# Patient Record
Sex: Male | Born: 1945
Health system: Southern US, Community
[De-identification: ages and names within clinical notes are randomized; demographics above are authoritative.]

## PROBLEM LIST (undated history)

## (undated) DIAGNOSIS — G4730 Sleep apnea, unspecified: Secondary | ICD-10-CM

## (undated) DIAGNOSIS — R7303 Prediabetes: Secondary | ICD-10-CM

## (undated) DIAGNOSIS — F329 Major depressive disorder, single episode, unspecified: Secondary | ICD-10-CM

## (undated) DIAGNOSIS — I219 Acute myocardial infarction, unspecified: Secondary | ICD-10-CM

## (undated) DIAGNOSIS — S2239XA Fracture of one rib, unspecified side, initial encounter for closed fracture: Secondary | ICD-10-CM

## (undated) DIAGNOSIS — I10 Essential (primary) hypertension: Secondary | ICD-10-CM

## (undated) DIAGNOSIS — H9190 Unspecified hearing loss, unspecified ear: Secondary | ICD-10-CM

## (undated) DIAGNOSIS — E119 Type 2 diabetes mellitus without complications: Secondary | ICD-10-CM

## (undated) DIAGNOSIS — Z85820 Personal history of malignant melanoma of skin: Secondary | ICD-10-CM

## (undated) DIAGNOSIS — I2510 Atherosclerotic heart disease of native coronary artery without angina pectoris: Secondary | ICD-10-CM

## (undated) DIAGNOSIS — S2249XA Multiple fractures of ribs, unspecified side, initial encounter for closed fracture: Secondary | ICD-10-CM

## (undated) DIAGNOSIS — E785 Hyperlipidemia, unspecified: Secondary | ICD-10-CM

## (undated) DIAGNOSIS — J302 Other seasonal allergic rhinitis: Secondary | ICD-10-CM

## (undated) DIAGNOSIS — F32A Depression, unspecified: Secondary | ICD-10-CM

## (undated) HISTORY — DX: Hyperlipidemia, unspecified: E78.5

## (undated) HISTORY — DX: Other seasonal allergic rhinitis: J30.2

## (undated) HISTORY — DX: Atherosclerotic heart disease of native coronary artery without angina pectoris: I25.10

## (undated) HISTORY — DX: Acute myocardial infarction, unspecified: I21.9

## (undated) HISTORY — DX: Multiple fractures of ribs, unspecified side, initial encounter for closed fracture: S22.49XA

## (undated) HISTORY — PX: WISDOM TOOTH EXTRACTION: SHX21

## (undated) HISTORY — DX: Fracture of one rib, unspecified side, initial encounter for closed fracture: S22.39XA

## (undated) HISTORY — DX: Essential (primary) hypertension: I10

## (undated) HISTORY — DX: Depression, unspecified: F32.A

## (undated) HISTORY — DX: Major depressive disorder, single episode, unspecified: F32.9

---

## 1988-05-13 DIAGNOSIS — Z85820 Personal history of malignant melanoma of skin: Secondary | ICD-10-CM

## 1988-05-13 HISTORY — DX: Personal history of malignant melanoma of skin: Z85.820

## 1988-05-13 HISTORY — PX: MELANOMA EXCISION: SHX5266

## 2004-06-18 ENCOUNTER — Emergency Department (HOSPITAL_COMMUNITY): Admission: EM | Admit: 2004-06-18 | Discharge: 2004-06-18 | Payer: Self-pay | Admitting: Emergency Medicine

## 2004-06-26 ENCOUNTER — Ambulatory Visit: Payer: Self-pay | Admitting: Family Medicine

## 2004-08-30 ENCOUNTER — Ambulatory Visit: Payer: Self-pay | Admitting: Family Medicine

## 2005-02-14 ENCOUNTER — Ambulatory Visit: Payer: Self-pay | Admitting: Family Medicine

## 2006-06-25 ENCOUNTER — Ambulatory Visit: Payer: Self-pay | Admitting: Family Medicine

## 2006-08-06 ENCOUNTER — Ambulatory Visit: Payer: Self-pay | Admitting: Family Medicine

## 2006-08-06 LAB — CONVERTED CEMR LAB
AST: 26 units/L (ref 0–37)
Albumin: 3.6 g/dL (ref 3.5–5.2)
Alkaline Phosphatase: 66 units/L (ref 39–117)
BUN: 13 mg/dL (ref 6–23)
Basophils Absolute: 0 10*3/uL (ref 0.0–0.1)
Chloride: 105 meq/L (ref 96–112)
Cholesterol: 232 mg/dL (ref 0–200)
Creatinine, Ser: 1.4 mg/dL (ref 0.4–1.5)
Direct LDL: 175.8 mg/dL
Eosinophils Absolute: 0.2 10*3/uL (ref 0.0–0.6)
GFR calc non Af Amer: 55 mL/min
HDL: 35.9 mg/dL — ABNORMAL LOW (ref >39.0)
MCHC: 34.6 g/dL (ref 30.0–36.0)
MCV: 94.9 fL (ref 78.0–100.0)
Monocytes Relative: 8.4 % (ref 3.0–11.0)
PSA: 0.84 ng/mL
PSA: 0.84 ng/mL (ref 0.10–4.00)
Potassium: 3.6 meq/L (ref 3.5–5.1)
RBC: 4.69 M/uL (ref 4.22–5.81)
RDW: 12.8 % (ref 11.5–14.6)
Sodium: 142 meq/L (ref 135–145)
Total Bilirubin: 0.8 mg/dL (ref 0.3–1.2)
Total CHOL/HDL Ratio: 6.5
Triglycerides: 184 mg/dL — ABNORMAL HIGH (ref 0–149)

## 2006-08-07 ENCOUNTER — Ambulatory Visit: Payer: Self-pay | Admitting: Family Medicine

## 2006-09-19 ENCOUNTER — Encounter: Payer: Self-pay | Admitting: Family Medicine

## 2006-09-19 DIAGNOSIS — I10 Essential (primary) hypertension: Secondary | ICD-10-CM | POA: Insufficient documentation

## 2006-09-19 DIAGNOSIS — R454 Irritability and anger: Secondary | ICD-10-CM | POA: Insufficient documentation

## 2006-09-19 DIAGNOSIS — F329 Major depressive disorder, single episode, unspecified: Secondary | ICD-10-CM | POA: Insufficient documentation

## 2006-09-23 ENCOUNTER — Ambulatory Visit: Payer: Self-pay | Admitting: Family Medicine

## 2006-09-23 LAB — CONVERTED CEMR LAB
ALT: 32 units/L (ref 0–40)
AST: 33 units/L (ref 0–37)
CO2: 28 meq/L (ref 19–32)
Chloride: 106 meq/L (ref 96–112)
Cholesterol: 226 mg/dL (ref 0–200)
Creatinine, Ser: 1.4 mg/dL (ref 0.4–1.5)
Direct LDL: 167.4 mg/dL
Glucose, Bld: 116 mg/dL — ABNORMAL HIGH (ref 70–99)
HDL: 26.5 mg/dL — ABNORMAL LOW (ref >39.0)
Hgb A1c MFr Bld: 6.2 % — ABNORMAL HIGH (ref 4.6–6.0)
PSA: 1.04 ng/mL (ref 0.10–4.00)
Potassium: 3.9 meq/L (ref 3.5–5.1)
Sodium: 140 meq/L (ref 135–145)
VLDL: 28 mg/dL (ref 0–40)

## 2006-09-26 ENCOUNTER — Ambulatory Visit: Payer: Self-pay | Admitting: Family Medicine

## 2006-09-27 LAB — CONVERTED CEMR LAB: OCCULT 1: POSITIVE

## 2006-10-03 ENCOUNTER — Ambulatory Visit: Payer: Self-pay | Admitting: Family Medicine

## 2006-10-07 ENCOUNTER — Encounter: Payer: Self-pay | Admitting: Family Medicine

## 2006-10-28 ENCOUNTER — Encounter (INDEPENDENT_AMBULATORY_CARE_PROVIDER_SITE_OTHER): Payer: Self-pay | Admitting: *Deleted

## 2006-10-29 ENCOUNTER — Ambulatory Visit: Payer: Self-pay | Admitting: Family Medicine

## 2006-10-29 LAB — CONVERTED CEMR LAB: Hgb A1c MFr Bld: 6.1 % — ABNORMAL HIGH (ref 4.6–6.0)

## 2006-10-30 ENCOUNTER — Ambulatory Visit: Payer: Self-pay | Admitting: Family Medicine

## 2006-10-31 ENCOUNTER — Encounter (INDEPENDENT_AMBULATORY_CARE_PROVIDER_SITE_OTHER): Payer: Self-pay | Admitting: *Deleted

## 2007-02-09 ENCOUNTER — Ambulatory Visit: Payer: Self-pay | Admitting: Family Medicine

## 2007-02-09 LAB — CONVERTED CEMR LAB
ALT: 22 units/L (ref 0–53)
AST: 22 units/L (ref 0–37)

## 2007-02-18 ENCOUNTER — Ambulatory Visit: Payer: Self-pay | Admitting: Family Medicine

## 2007-02-18 DIAGNOSIS — R7303 Prediabetes: Secondary | ICD-10-CM | POA: Insufficient documentation

## 2007-03-09 ENCOUNTER — Ambulatory Visit: Payer: Self-pay | Admitting: Family Medicine

## 2007-05-21 ENCOUNTER — Ambulatory Visit: Payer: Self-pay | Admitting: Family Medicine

## 2007-10-27 ENCOUNTER — Ambulatory Visit: Payer: Self-pay | Admitting: Family Medicine

## 2009-02-09 ENCOUNTER — Telehealth: Payer: Self-pay | Admitting: Family Medicine

## 2009-04-13 ENCOUNTER — Telehealth: Payer: Self-pay | Admitting: Family Medicine

## 2009-05-25 ENCOUNTER — Ambulatory Visit: Payer: Self-pay | Admitting: Family Medicine

## 2009-07-03 ENCOUNTER — Ambulatory Visit: Payer: Self-pay | Admitting: Family Medicine

## 2009-07-03 ENCOUNTER — Telehealth: Payer: Self-pay | Admitting: Family Medicine

## 2009-07-13 ENCOUNTER — Ambulatory Visit: Payer: Self-pay | Admitting: Family Medicine

## 2009-07-13 LAB — CONVERTED CEMR LAB
AST: 44 units/L — ABNORMAL HIGH (ref 0–37)
BUN: 13 mg/dL (ref 6–23)
Basophils Absolute: 0 10*3/uL (ref 0.0–0.1)
Bilirubin, Direct: 0 mg/dL (ref 0.0–0.3)
Cholesterol: 227 mg/dL — ABNORMAL HIGH (ref 0–200)
Creatinine, Ser: 1.3 mg/dL (ref 0.4–1.5)
GFR calc non Af Amer: 59.1 mL/min (ref >60)
Glucose, Bld: 107 mg/dL — ABNORMAL HIGH (ref 70–99)
HCT: 42.1 % (ref 39.0–52.0)
HDL: 42.3 mg/dL (ref >39.00)
Hgb A1c MFr Bld: 6.2 % (ref 4.6–6.5)
Lymphs Abs: 2 10*3/uL (ref 0.7–4.0)
Microalb Creat Ratio: 28.1 mg/g (ref 0.0–30.0)
Microalb, Ur: 11.5 mg/dL — ABNORMAL HIGH (ref 0.0–1.9)
Monocytes Absolute: 0.7 10*3/uL (ref 0.1–1.0)
Monocytes Relative: 8.3 % (ref 3.0–12.0)
Neutrophils Relative %: 65.9 % (ref 43.0–77.0)
PSA: 1.33 ng/mL (ref 0.10–4.00)
Platelets: 260 10*3/uL (ref 150.0–400.0)
Potassium: 3.7 meq/L (ref 3.5–5.1)
RDW: 13 % (ref 11.5–14.6)
TSH: 0.64 microintl units/mL (ref 0.35–5.50)
Total Bilirubin: 0.7 mg/dL (ref 0.3–1.2)
Triglycerides: 116 mg/dL (ref 0.0–149.0)
VLDL: 23.2 mg/dL (ref 0.0–40.0)

## 2009-07-18 ENCOUNTER — Ambulatory Visit: Payer: Self-pay | Admitting: Family Medicine

## 2009-08-25 ENCOUNTER — Ambulatory Visit: Payer: Self-pay | Admitting: Family Medicine

## 2009-08-25 LAB — FECAL OCCULT BLOOD, GUAIAC: Fecal Occult Blood: NEGATIVE

## 2009-08-28 ENCOUNTER — Encounter (INDEPENDENT_AMBULATORY_CARE_PROVIDER_SITE_OTHER): Payer: Self-pay | Admitting: *Deleted

## 2009-09-06 ENCOUNTER — Ambulatory Visit: Payer: Self-pay | Admitting: Family Medicine

## 2009-09-06 LAB — CONVERTED CEMR LAB
ALT: 39 units/L (ref 0–53)
AST: 36 units/L (ref 0–37)

## 2009-10-18 ENCOUNTER — Ambulatory Visit: Payer: Self-pay | Admitting: Family Medicine

## 2009-10-18 LAB — CONVERTED CEMR LAB
ALT: 38 units/L (ref 0–53)
AST: 36 units/L (ref 0–37)
Cholesterol: 172 mg/dL (ref 0–200)

## 2009-10-24 ENCOUNTER — Ambulatory Visit: Payer: Self-pay | Admitting: Family Medicine

## 2009-10-24 DIAGNOSIS — J310 Chronic rhinitis: Secondary | ICD-10-CM | POA: Insufficient documentation

## 2009-10-24 DIAGNOSIS — G4733 Obstructive sleep apnea (adult) (pediatric): Secondary | ICD-10-CM | POA: Insufficient documentation

## 2009-12-13 ENCOUNTER — Encounter (INDEPENDENT_AMBULATORY_CARE_PROVIDER_SITE_OTHER): Payer: Self-pay | Admitting: *Deleted

## 2010-06-12 NOTE — Letter (Signed)
Summary: Results Follow up Letter  [place] [facility]  [address] [place], [place] [address]   Phone: [phone]  Fax: [phone]    08/28/2009 MRN: [id number]    [facility] [address] [place], [place]  [address]    Dear Mr. LEMBKE,  The following are the results of your recent test(s):  Test         Result    Pap Smear:        Normal _____  Not Normal _____ Comments: ______________________________________________________ Cholesterol: LDL(Bad cholesterol):         Your goal is less than:         HDL (Good cholesterol):       Your goal is more than: Comments:  ______________________________________________________ Mammogram:        Normal _____  Not Normal _____ Comments:  ___________________________________________________________________ Hemoccult:        Normal __X___  Not normal _______ Comments:    Yearly follow up is recommended.   _____________________________________________________________________ Other Tests:    We routinely do not discuss normal results over the telephone.  If you desire a copy of the results, or you have any questions about this information we can discuss them at your next office visit.   Sincerely,    Arta Silence, M.D.  RNS:lsf

## 2010-06-12 NOTE — Assessment & Plan Note (Signed)
Summary: ?BRONCHITIS/MK   Vital Signs:  Patient profile:   65 year old male Height:      70 inches Weight:      231 pounds BMI:     33.26 Temp:     98.7 degrees F oral Pulse rate:   88 / minute Pulse rhythm:   regular BP sitting:   160 / 90  (left arm) Cuff size:   large  Vitals Entered By: Delilah Shan CMA Duncan Dull) (May 25, 2009 9:53 AM) CC: ? bronchitis   History of Present Illness: 65 yo with 3 day h/o URI symptoms. Productive cough, coughing spells at night. No fevers, chills. Has nasal congestion. No shortness of breath, N/v/d. Taking OTC Nyquil.  HTN- running out of BP meds, no refills because has not made an appt for CPX. Now has appt with Dr. Hetty Ely scheduled on 07/18/2009. No CP, LE edema, blurred vision.  Current Medications (verified): 1)  Altace 10 Mg  Tabs (Ramipril) .... One Tab By Mouth in Morning. 2)  Toprol Xl 25 Mg Tb24 (Metoprolol Succinate) .... One Tab By Mouth Hs 3)  Flexeril 10 Mg  Tabs (Cyclobenzaprine Hcl) .... One Tab By Mouth Tid 4)  Cheratussin Ac 100-10 Mg/53ml Syrp (Guaifenesin-Codeine) .... 5 Ml At Bedtime As Needed Cough.  Allergies: 1)  Paxil Cr (Paroxetine Hcl) 2)  Zoloft (Sertraline Hcl)  Review of Systems      See HPI General:  Complains of malaise; denies chills and fever. Eyes:  Denies blurring. CV:  Denies chest pain or discomfort. Resp:  Complains of cough and sputum productive; denies shortness of breath and wheezing. GI:  Denies abdominal pain, diarrhea, nausea, and vomiting.  Physical Exam  General:  Well-developed,well-nourished,in no acute distress; alert,appropriate and cooperative throughout examination Ears:  External ear exam shows no significant lesions or deformities.  Otoscopic examination reveals clear canals, tympanic membranes are intact bilaterally without bulging, retraction, inflammation or discharge. Hearing is grossly normal bilaterally. Nose:  external erythema and mucosal erythema.   Mouth:  Oral  mucosa and oropharynx without lesions or exudates.  Teeth in good repair. Lungs:  Normal respiratory effort, chest expands symmetrically. Lungs are clear to auscultation, no crackles or wheezes. Heart:  Normal rate and regular rhythm. S1 and S2 normal without gallop, murmur, click, rub or other extra sounds. Psych:  Cognition and judgment appear intact. Alert and cooperative with normal attention span and concentration. No apparent delusions, illusions, hallucinations   Impression & Recommendations:  Problem # 1:  URI (ICD-465.9) Assessment New Likely viral.  Continue supportive care.  Cheratussin for coughing spells nightly as needed.  See patient instructions for details. His updated medication list for this problem includes:    Cheratussin Ac 100-10 Mg/21ml Syrp (Guaifenesin-codeine) .Marland KitchenMarland KitchenMarland KitchenMarland Kitchen 5 ml at bedtime as needed cough.  Problem # 2:  HYPERTENSION (ICD-401.9) Assessment: Deteriorated Likely combination of running out of meds and URI.  Will refill his meds until his appt with Dr. Hetty Ely on 07/18/2009. His updated medication list for this problem includes:    Altace 10 Mg Tabs (Ramipril) ..... One tab by mouth in morning.    Toprol Xl 25 Mg Tb24 (Metoprolol succinate) ..... One tab by mouth hs  Complete Medication List: 1)  Altace 10 Mg Tabs (Ramipril) .... One tab by mouth in morning. 2)  Toprol Xl 25 Mg Tb24 (Metoprolol succinate) .... One tab by mouth hs 3)  Flexeril 10 Mg Tabs (Cyclobenzaprine hcl) .... One tab by mouth tid 4)  Cheratussin Ac 100-10 Mg/65ml  Syrp (Guaifenesin-codeine) .... 5 ml at bedtime as needed cough.  Patient Instructions: 1)   Treat sympotmatically  with guafenesin, nasal saline irrigation, and Tylenol.  Cough suppressant at night. Call if not improving as expected in 7- 10 days as on viral timeline.  Prescriptions: CHERATUSSIN AC 100-10 MG/5ML SYRP (GUAIFENESIN-CODEINE) 5 ml at bedtime as needed cough.  #4 ounces x 0   Entered and Authorized by:   Ruthe Mannan  MD   Signed by:   Ruthe Mannan MD on 05/25/2009   Method used:   Print then Give to Patient   RxID:   [id number] ALTACE 10 MG  TABS (RAMIPRIL) one tab by mouth in morning.  #30 x 1   Entered and Authorized by:   Ruthe Mannan MD   Signed by:   Ruthe Mannan MD on 05/25/2009   Method used:   Electronically to        [facility]* (retail)       [address]       [place], [place]  [address]       Ph: [phone]       Fax: [phone]   RxID:   [id number] TOPROL XL 25 MG TB24 (METOPROLOL SUCCINATE) one tab by mouth hs  #30 x 1   Entered and Authorized by:   Ruthe Mannan MD   Signed by:   Ruthe Mannan MD on 05/25/2009   Method used:   Electronically to        [facility]* (retail)       [address]       [place], [place]  [address]       Ph: [phone]       Fax: [phone]   RxID:   [id number]   Current Allergies (reviewed today): PAXIL CR (PAROXETINE HCL) ZOLOFT (SERTRALINE HCL)

## 2010-06-12 NOTE — Letter (Signed)
Summary: Nadara Eaton letter  [place] at [facility]  [address] [place], [place] [address]   Phone: [phone]  Fax: [phone]       12/13/2009 MRN: [id number]  [facility] [address] [place], [place]  [address]  Dear Mr. Jesse [name],  [facility], and [facility] announce the retirement of Arta Silence, M.D., from full-time practice at the [facility] office effective November 09, 2009 and his plans of returning part-time.  It is important to Dr. Hetty Ely and to our practice that you understand that [facility] has seven physicians in our office for your health care needs.  We will continue to offer the same exceptional care that you have today.    Dr. Hetty Ely has spoken to many of you about his plans for retirement and returning part-time in the fall.   We will continue to work with you through the transition to schedule appointments for you in the office and meet the high standards that [place] is committed to.   Again, it is with great pleasure that we share the news that Dr. Hetty Ely will return to [facility] in October of 2011 with a reduced schedule.    If you have any questions, or would like to request an appointment with one of our physicians, please call us at [phone] and press the option for Scheduling an appointment.  We take pleasure in providing you with excellent patient care and look forward to seeing you at your next office visit.  Our [facility] Physicians are:  Tillman Abide, M.D. Laurita Quint, M.D. Roxy Manns, M.D. Kerby Nora, M.D. Hannah Beat, M.D. Ruthe Mannan, M.D. We proudly welcomed Raechel Ache, M.D. and Eustaquio Boyden, M.D. to the practice in July/August 2011.  Sincerely,  [facility]

## 2010-06-12 NOTE — Assessment & Plan Note (Signed)
Summary: F/U AFTER LABS / LFW   Vital Signs:  Patient profile:   65 year old male Weight:      233.75 pounds Temp:     98.4 degrees F oral Pulse rate:   72 / minute Pulse rhythm:   regular BP sitting:   140 / 90  (left arm) Cuff size:   large  Vitals Entered By: Sydell Axon LPN (October 24, 2009 9:04 AM) CC: 3 Month follow-up after labs   History of Present Illness: Pt here to followup chol medications.  He is interested in a pill for losing weight. His wife tells him that he grunrs at night at times and has difficulty breathing through his nose. He has used Afrin like meds in the past and it helped breathing but then he realized it took a long time and significant effort to get off it. He wonders about nasal surgery.    Allergies: 1)  Paxil Cr (Paroxetine Hcl) 2)  Zoloft (Sertraline Hcl)   Impression & Recommendations:  Problem # 1:  HYPERCHOLESTEROLEMIA, 263/ LDL 201 (ICD-272.0) Assessment Improved But not quite adequate. Cont Prava 80 but lose weight as he intands and will not need to incr medication, which would mean going to Lipitor as Simva 80 would not be available to him due to new restrictions. LOSE WEIGHT. His updated medication list for this problem includes:    Pravastatin Sodium 80 Mg Tabs (Pravastatin sodium) ..... One taqb by mouth at night  Problem # 2:  RHINITIS, CHRONIC (ICD-472.0) Assessment: New First complaint of this....try Fluticasone nasal, demo given in use. Opposite hand, don't sniff, etc. LOSE WEIGHT. Can avoid nasal surgery if successful.  Problem # 3:  OBSTRUCTIVE SLEEP APNEA (ICD-327.23) Assessment: New Probable.Marland KitchenMarland KitchenRasheen Redinger is interested in losing weight and not interested in sleep study or "mask"....weight loss will help, poss solve. Together with nasal inhaler, should at least help.  Complete Medication List: 1)  Altace 10 Mg Tabs (Ramipril) .... One tab by mouth in morning. 2)  Toprol Xl 25 Mg Tb24 (Metoprolol succinate) .... One  tab by mouth hs 3)  Pravastatin Sodium 80 Mg Tabs (Pravastatin sodium) .... One taqb by mouth at night 4)  Fluticasone Propionate 50 Mcg/act Susp (Fluticasone propionate) .... One inh each nostril two times a day  Patient Instructions: 1)  RTC 3/12 for Comp Exam, labs prior Prescriptions: FLUTICASONE PROPIONATE 50 MCG/ACT SUSP (FLUTICASONE PROPIONATE) one inh each nostril two times a day  #1 MDI x 12   Entered and Authorized by:   Shaune Leeks MD   Signed by:   Shaune Leeks MD on 10/24/2009   Method used:   Electronically to        [facility]* (retail)       [address]       [place], [place]  [address]       Ph: [phone]       Fax: [phone]   RxID:   [id number]   Current Allergies (reviewed today): PAXIL CR (PAROXETINE HCL) ZOLOFT (SERTRALINE HCL)

## 2010-06-12 NOTE — Progress Notes (Signed)
Summary: cough-making chest hurt   Phone Note Call from Patient Call back at Home Phone [phone]   Caller: Patient Call For: Shaune Leeks MD Summary of Call: Patient wanted to come in for app. and there is nothing available for today, I offered app for tomorrow. Patient says that he just can not come in any other day this week. He says that he has a terrible cough that is causing his chest to hurt. He wants to know if he can get something for his cough called in for him to [place].  Initial call taken by: Melody Comas,  July 03, 2009 1:52 PM  Follow-up for Phone Call        I'll see him at end of day. Follow-up by: Shaune Leeks MD,  July 03, 2009 2:04 PM  Additional Follow-up for Phone Call Additional follow up Details #1::        Appt Scheduled Today Additional Follow-up by: Melody Comas,  July 03, 2009 2:38 PM
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
## 2010-06-12 NOTE — Assessment & Plan Note (Signed)
Summary: cough per dr. Helga Asbury/ alc   Vital Signs:  Patient profile:   65 year old male Weight:      231 pounds Temp:     98.8 degrees F oral Pulse rate:   92 / minute Pulse rhythm:   regular BP sitting:   164 / 94  (left arm) Cuff size:   large  Vitals Entered By: Sydell Axon LPN (July 03, 2009 4:30 PM) CC: Head and chest congestion, cold, fever and non-productive cough   History of Present Illness: Pt here for cough and reported chest pain but denies chest discomfort now. He works 12 hoursw Fri, Sat and Sun and his chest is sore from the coughing.   Problems Prior to Update: 1)  Uri  (ICD-465.9) 2)  Back Pain, Lumbar  (ICD-724.2) 3)  Subconjunctival Hemorrhage, Right  (ICD-372.72) 4)  Screening For Malignannt Neoplasm, Site Nec  (ICD-V76.49) 5)  Screening For Malignant Neoplasm, Prostate  (ICD-V76.44) 6)  Disorder, Carbohydrate Metabolism Nos  (ICD-271.9) 7)  Aodm  (ICD-250.00) 8)  Hypercholesterolemia, 263/ Ldl 201  (ICD-272.0) 9)  Melanoma, Hx Of, Right Inner Knee  (ICD-V10.82) 10)  Hypertension  (ICD-401.9) 11)  Depression  (ICD-311)  Medications Prior to Update: 1)  Altace 10 Mg  Tabs (Ramipril) .... One Tab By Mouth in Morning. 2)  Toprol Xl 25 Mg Tb24 (Metoprolol Succinate) .... One Tab By Mouth Hs 3)  Flexeril 10 Mg  Tabs (Cyclobenzaprine Hcl) .... One Tab By Mouth Tid 4)  Cheratussin Ac 100-10 Mg/63ml Syrp (Guaifenesin-Codeine) .... 5 Ml At Bedtime As Needed Cough.  Allergies: 1)  Paxil Cr (Paroxetine Hcl) 2)  Zoloft (Sertraline Hcl)  Physical Exam  General:  Well-developed,well-nourished,in no acute distress; alert,appropriate and cooperative throughout examination, mildly congested. Head:  Normocephalic and atraumatic without obvious abnormalities. No apparent alopecia or balding. Sinuses NT. Eyes:  Conjunctiva clear bilaterally.  Ears:  External ear exam shows no significant lesions or deformities.  Otoscopic examination reveals clear canals,  tympanic membranes are intact bilaterally without bulging, retraction, inflammation or discharge. Hearing is grossly normal bilaterally. Nose:  External nasal examination shows no deformity or inflammation. Nasal mucosa are pink and moist without lesions or exudates. Mouth:  Oral mucosa and oropharynx without lesions or exudates.  Teeth in good repair. Neck:  No deformities, masses, or tenderness noted. Lungs:  Mild ronchi with fine wheeze that does not totally clear with cough, anteriorally > posteriorally. Heart:  Normal rate and regular rhythm. S1 and S2 normal without gallop, murmur, click, rub or other extra sounds.   Impression & Recommendations:  Problem # 1:  BRONCHITIS- ACUTE (ICD-466.0) Assessment New  See instructions. The following medications were removed from the medication list:    Cheratussin Ac 100-10 Mg/54ml Syrp (Guaifenesin-codeine) .Marland KitchenMarland KitchenMarland KitchenMarland Kitchen 5 ml at bedtime as needed cough. His updated medication list for this problem includes:    Augmentin 500-125 Mg Tabs (Amoxicillin-pot clavulanate) ..... One tab by mouth three times a day    Tessalon 200 Mg Caps (Benzonatate) ..... One tab by mouth three times a day  Take antibiotics and other medications as directed. Encouraged to push clear liquids, get enough rest, and take acetaminophen as needed. To be seen in 5-7 days if no improvement, sooner if worse.  Complete Medication List: 1)  Altace 10 Mg Tabs (Ramipril) .... One tab by mouth in morning. 2)  Toprol Xl 25 Mg Tb24 (Metoprolol succinate) .... One tab by mouth hs 3)  Augmentin 500-125 Mg Tabs (Amoxicillin-pot clavulanate) .... One tab  by mouth three times a day 4)  Tessalon 200 Mg Caps (Benzonatate) .... One tab by mouth three times a day  Patient Instructions: 1)  Take Augmentin 2)  Take Guaifenesin by going to [facility] or [facility] and getting MUCOUS RELIEF EXPECTORANT (400mg ), take 11/2 tabs by mouth AM and NOON. 3)  Drink lots of fluids anytime taking  Guaifenesin.  4)  Take Tessalon three times a day  5)  May use Nyquil on ly once at night. 6)  Keep lozenge in mouth for minimum one week. 7)  Gargle with warm salt water every half hour for 2 days. Prescriptions: TESSALON 200 MG CAPS (BENZONATATE) one tab by mouth three times a day  #50 x 0   Entered and Authorized by:   Shaune Leeks MD   Signed by:   Shaune Leeks MD on 07/03/2009   Method used:   Electronically to        [facility]* (retail)       [address]       [place], [place]  [address]       Ph: [phone]       Fax: [phone]   RxID:   [id number] AUGMENTIN 500-125 MG TABS (AMOXICILLIN-POT CLAVULANATE) one tab by mouth three times a day  #42 x 0   Entered and Authorized by:   Shaune Leeks MD   Signed by:   Shaune Leeks MD on 07/03/2009   Method used:   Electronically to        [facility]* (retail)       [address]       [place], [place]  [address]       Ph: [phone]       Fax: [phone]   RxID:   [id number]   Current Allergies (reviewed today): PAXIL CR (PAROXETINE HCL) ZOLOFT (SERTRALINE HCL)

## 2010-08-20 ENCOUNTER — Other Ambulatory Visit: Payer: Self-pay | Admitting: *Deleted

## 2010-08-20 MED ORDER — PRAVASTATIN SODIUM 80 MG PO TABS: 80.0000 mg | ORAL_TABLET | Freq: Every day | ORAL | Status: DC

## 2010-08-20 MED ORDER — METOPROLOL TARTRATE 25 MG PO TABS: 25.0000 mg | ORAL_TABLET | Freq: Every day | ORAL | Status: DC

## 2010-09-17 ENCOUNTER — Other Ambulatory Visit: Payer: Self-pay | Admitting: *Deleted

## 2010-09-17 MED ORDER — RAMIPRIL 10 MG PO CAPS: 10.0000 mg | ORAL_CAPSULE | Freq: Every day | ORAL | Status: DC

## 2010-09-24 ENCOUNTER — Other Ambulatory Visit: Payer: Self-pay | Admitting: *Deleted

## 2010-09-24 MED ORDER — RAMIPRIL 10 MG PO CAPS: 10.0000 mg | ORAL_CAPSULE | Freq: Every day | ORAL | Status: DC

## 2010-09-24 NOTE — Telephone Encounter (Signed)
Rx refilled.

## 2010-11-19 ENCOUNTER — Other Ambulatory Visit: Payer: Self-pay | Admitting: *Deleted

## 2010-11-19 MED ORDER — METOPROLOL TARTRATE 25 MG PO TABS: 25.0000 mg | ORAL_TABLET | Freq: Every day | ORAL | Status: DC

## 2010-11-19 MED ORDER — RAMIPRIL 10 MG PO CAPS: 10.0000 mg | ORAL_CAPSULE | Freq: Every day | ORAL | Status: DC

## 2010-11-19 NOTE — Telephone Encounter (Signed)
Received faxed refill request from pharmacy. Refills sent in electronically to pharmacy.

## 2010-11-23 ENCOUNTER — Other Ambulatory Visit: Payer: Self-pay | Admitting: *Deleted

## 2010-11-23 MED ORDER — FLUTICASONE PROPIONATE 50 MCG/ACT NA SUSP: 1.0000 | Freq: Two times a day (BID) | NASAL | Status: DC

## 2010-11-23 NOTE — Telephone Encounter (Signed)
Received faxed refill request, this was not on patients medication list.  Please advise.

## 2010-11-27 ENCOUNTER — Other Ambulatory Visit: Payer: Self-pay | Admitting: *Deleted

## 2010-11-27 MED ORDER — PRAVASTATIN SODIUM 80 MG PO TABS: 80.0000 mg | ORAL_TABLET | Freq: Every day | ORAL | Status: DC

## 2010-12-24 ENCOUNTER — Other Ambulatory Visit: Payer: Self-pay | Admitting: *Deleted

## 2010-12-24 MED ORDER — FLUTICASONE PROPIONATE 50 MCG/ACT NA SUSP: 1.0000 | Freq: Two times a day (BID) | NASAL | Status: DC

## 2011-01-03 ENCOUNTER — Other Ambulatory Visit: Payer: Self-pay | Admitting: Family Medicine

## 2011-01-03 ENCOUNTER — Encounter: Payer: Self-pay | Admitting: Family Medicine

## 2011-01-03 DIAGNOSIS — E119 Type 2 diabetes mellitus without complications: Secondary | ICD-10-CM

## 2011-01-03 DIAGNOSIS — C4370 Malignant melanoma of unspecified lower limb, including hip: Secondary | ICD-10-CM

## 2011-01-09 ENCOUNTER — Other Ambulatory Visit (INDEPENDENT_AMBULATORY_CARE_PROVIDER_SITE_OTHER): Payer: 59

## 2011-01-09 DIAGNOSIS — C4370 Malignant melanoma of unspecified lower limb, including hip: Secondary | ICD-10-CM

## 2011-01-09 DIAGNOSIS — E119 Type 2 diabetes mellitus without complications: Secondary | ICD-10-CM

## 2011-01-09 LAB — RENAL FUNCTION PANEL
Calcium: 9 mg/dL (ref 8.4–10.5)
Glucose, Bld: 101 mg/dL — ABNORMAL HIGH (ref 70–99)
Phosphorus: 2.7 mg/dL (ref 2.3–4.6)
Potassium: 3.5 mEq/L (ref 3.5–5.1)
Sodium: 139 mEq/L (ref 135–145)

## 2011-01-09 LAB — CBC WITH DIFFERENTIAL/PLATELET
Basophils Relative: 0.6 % (ref 0.0–3.0)
Eosinophils Relative: 1.3 % (ref 0.0–5.0)
HCT: 43.7 % (ref 39.0–52.0)
Hemoglobin: 14.8 g/dL (ref 13.0–17.0)
Lymphs Abs: 1.9 10*3/uL (ref 0.7–4.0)
Monocytes Relative: 7.8 % (ref 3.0–12.0)
Neutro Abs: 5.2 10*3/uL (ref 1.4–7.7)
Platelets: 214 10*3/uL (ref 150.0–400.0)
RBC: 4.49 Mil/uL (ref 4.22–5.81)
WBC: 7.9 10*3/uL (ref 4.5–10.5)

## 2011-01-09 LAB — HEPATIC FUNCTION PANEL
Bilirubin, Direct: 0 mg/dL (ref 0.0–0.3)
Total Protein: 7.1 g/dL (ref 6.0–8.3)

## 2011-01-09 LAB — LIPID PANEL
Cholesterol: 192 mg/dL (ref 0–200)
LDL Cholesterol: 113 mg/dL — ABNORMAL HIGH (ref 0–99)
VLDL: 30.4 mg/dL (ref 0.0–40.0)

## 2011-01-09 LAB — HEMOGLOBIN A1C: Hgb A1c MFr Bld: 6.1 % (ref 4.6–6.5)

## 2011-01-16 ENCOUNTER — Encounter: Payer: Self-pay | Admitting: Family Medicine

## 2011-01-16 ENCOUNTER — Ambulatory Visit (INDEPENDENT_AMBULATORY_CARE_PROVIDER_SITE_OTHER): Payer: Medicare Other | Admitting: Family Medicine

## 2011-01-16 VITALS — BP 160/88 | HR 76 | Temp 98.0°F | Ht 70.0 in | Wt 233.0 lb

## 2011-01-16 DIAGNOSIS — G4733 Obstructive sleep apnea (adult) (pediatric): Secondary | ICD-10-CM

## 2011-01-16 DIAGNOSIS — E7800 Pure hypercholesterolemia, unspecified: Secondary | ICD-10-CM

## 2011-01-16 DIAGNOSIS — F329 Major depressive disorder, single episode, unspecified: Secondary | ICD-10-CM

## 2011-01-16 DIAGNOSIS — J310 Chronic rhinitis: Secondary | ICD-10-CM

## 2011-01-16 DIAGNOSIS — I10 Essential (primary) hypertension: Secondary | ICD-10-CM

## 2011-01-16 DIAGNOSIS — E119 Type 2 diabetes mellitus without complications: Secondary | ICD-10-CM

## 2011-01-16 DIAGNOSIS — Z23 Encounter for immunization: Secondary | ICD-10-CM

## 2011-01-16 MED ORDER — RAMIPRIL 10 MG PO CAPS: 10.0000 mg | ORAL_CAPSULE | Freq: Every day | ORAL | Status: DC

## 2011-01-16 MED ORDER — FLUTICASONE PROPIONATE 50 MCG/ACT NA SUSP: 1.0000 | Freq: Two times a day (BID) | NASAL | Status: DC

## 2011-01-16 MED ORDER — METOPROLOL TARTRATE 25 MG PO TABS: 25.0000 mg | ORAL_TABLET | Freq: Two times a day (BID) | ORAL | Status: DC

## 2011-01-16 MED ORDER — PRAVASTATIN SODIUM 80 MG PO TABS: 80.0000 mg | ORAL_TABLET | Freq: Every day | ORAL | Status: DC

## 2011-01-16 NOTE — Assessment & Plan Note (Signed)
Helped by addition of nasal inhaler.

## 2011-01-16 NOTE — Assessment & Plan Note (Signed)
On Prava 40 with good results. Cont curr therapy. LDL slightly high for diabetes but admits being noncompliant with diet. Discussed.

## 2011-01-16 NOTE — Patient Instructions (Addendum)
Increase metoprolol to twice a day. RTC 2 mos for BP recheck. Pneumocc today.  Discuss Zostavax with pharmacy. iFOB sheet given today, declines colonoscopy. Flu shot next visit.

## 2011-01-16 NOTE — Assessment & Plan Note (Signed)
High today. Will increase Metoprolol to bid. Weight loss will really help most everything discussed this AM. Recheck in future. BP Readings from Last 3 Encounters:  01/16/11 160/88  10/24/09 140/90  07/18/09 144/90

## 2011-01-16 NOTE — Assessment & Plan Note (Signed)
Weight loss will help. Cont nasal inhaler as this has helped as well, when he uses it.

## 2011-01-16 NOTE — Progress Notes (Signed)
  Subjective:    Patient ID: Jesse [name], male    DOB: 1945-07-29, 65 y.o.   MRN: [id number]  HPI Pt here for initial Medicare PE. His weight is still up at 233. He had been at 200 for a while but has slipped up again. He is looking at starting regular exercise. He has back pain, no other joint pains.  He is using the nasal inhaler and is helping sleep.  He mows the yards around him and has allergic reactions. He has eye difficulties from this as well.     Review of Systems  Constitutional: Negative for fever, chills, diaphoresis, appetite change, fatigue and unexpected weight change.  HENT: Positive for hearing loss (Mild hearing loss. ). Negative for ear pain, tinnitus and ear discharge.        He just had eye exam. He has cataracts.  Eyes: Negative for pain, discharge and visual disturbance (eyes actually better than previously.).       Recent eye exam, has early cataracts.  Respiratory: Negative for cough, shortness of breath and wheezing.   Cardiovascular: Negative for chest pain and palpitations.       No SOB w/ exertion  Gastrointestinal: Negative for nausea, vomiting, abdominal pain, diarrhea, constipation and blood in stool.       No heartburn or swallowing problems.  Genitourinary: Positive for frequency (occasional.). Negative for dysuria and difficulty urinating.       No nocturia  Musculoskeletal: Positive for back pain (mild chronic.). Negative for myalgias and arthralgias.  Skin: Negative for rash.       No itching or dryness.  Neurological: Negative for tremors and numbness.       No tingling or balance problems.  Hematological: Negative for adenopathy. Does not bruise/bleed easily.  Psychiatric/Behavioral: Negative for dysphoric mood and agitation.       Objective:   Physical Exam  Constitutional: He is oriented to person, place, and time. He appears well-developed and well-nourished. No distress.  HENT:  Head: Normocephalic and atraumatic.  Right Ear:  External ear normal.  Left Ear: External ear normal.  Nose: Nose normal.  Mouth/Throat: Oropharynx is clear and moist.  Eyes: Conjunctivae and EOM are normal. Pupils are equal, round, and reactive to light. Right eye exhibits no discharge. Left eye exhibits no discharge. No scleral icterus.  Neck: Normal range of motion. Neck supple. No thyromegaly present.  Cardiovascular: Normal rate, regular rhythm, normal heart sounds and intact distal pulses.   No murmur heard. Pulmonary/Chest: Effort normal and breath sounds normal. No respiratory distress. He has no wheezes.  Abdominal: Soft. Bowel sounds are normal. He exhibits no distension and no mass. There is no tenderness. There is no rebound and no guarding.  Genitourinary: Rectum normal, prostate normal and penis normal. Guaiac negative stool.  Musculoskeletal: Normal range of motion. He exhibits no edema.  Lymphadenopathy:    He has no cervical adenopathy.  Neurological: He is alert and oriented to person, place, and time. Coordination normal.  Skin: Skin is warm and dry. No rash noted. He is not diaphoretic.  Psychiatric: He has a normal mood and affect. His behavior is normal. Judgment and thought content normal.          Assessment & Plan:  HMPE

## 2011-01-16 NOTE — Assessment & Plan Note (Signed)
Well controlled 

## 2011-01-16 NOTE — Assessment & Plan Note (Addendum)
Well controlled. Cont diet only approach. Is on statin and ACEI. Continue. Lab Results  Component Value Date   HGBA1C 6.1 01/09/2011

## 2011-01-31 ENCOUNTER — Encounter: Payer: Self-pay | Admitting: Family Medicine

## 2011-01-31 ENCOUNTER — Encounter: Payer: Self-pay | Admitting: *Deleted

## 2011-01-31 ENCOUNTER — Other Ambulatory Visit: Payer: 59

## 2011-01-31 ENCOUNTER — Other Ambulatory Visit: Payer: Self-pay | Admitting: Family Medicine

## 2011-01-31 DIAGNOSIS — Z0000 Encounter for general adult medical examination without abnormal findings: Secondary | ICD-10-CM

## 2011-03-28 ENCOUNTER — Encounter: Payer: Self-pay | Admitting: Family Medicine

## 2011-03-28 ENCOUNTER — Telehealth: Payer: Self-pay | Admitting: *Deleted

## 2011-03-28 ENCOUNTER — Ambulatory Visit (INDEPENDENT_AMBULATORY_CARE_PROVIDER_SITE_OTHER): Payer: Medicare Other | Admitting: Family Medicine

## 2011-03-28 DIAGNOSIS — I10 Essential (primary) hypertension: Secondary | ICD-10-CM

## 2011-03-28 DIAGNOSIS — E119 Type 2 diabetes mellitus without complications: Secondary | ICD-10-CM

## 2011-03-28 MED ORDER — MAGIC MOUTHWASH: 1.0000 mL | Freq: Four times a day (QID) | ORAL | Status: DC | PRN

## 2011-03-28 MED ORDER — RAMIPRIL 10 MG PO CAPS: ORAL_CAPSULE | ORAL | Status: DC

## 2011-03-28 NOTE — Telephone Encounter (Signed)
The prescription was meant to be for Magic Mouthwash, swish and spit 10ccs qid, or more, RF6.

## 2011-03-28 NOTE — Assessment & Plan Note (Signed)
Higher today. Increase Ramipril to 10mg  bid. Will see back to recheck. BP Readings from Last 3 Encounters:  03/28/11 164/100  01/16/11 160/88  10/24/09 140/90

## 2011-03-28 NOTE — Patient Instructions (Signed)
RTC end of Dec for BP recheck, Lab before.

## 2011-03-28 NOTE — Progress Notes (Signed)
  Subjective:    Patient ID: Jesse [name], male    DOB: 08-20-1945, 65 y.o.   MRN: [id number]  HPI Pt here for two month follow up. He has gained 4 pounds since being seen by our scales. His BP is higher. He has been a couch potato since retiring!  He admits he needs to get active and lose weight. He has tenderness in his mouth. Has been going on longer than any medication changes.    Review of SystemsNoncontributory except as above.  No food/diet changes lately.     Objective:   Physical Exam  Constitutional: He appears well-developed and well-nourished. No distress.  HENT:  Head: Normocephalic and atraumatic.  Right Ear: External ear normal.  Left Ear: External ear normal.  Nose: Nose normal.  Mouth/Throat: Oropharynx is clear and moist.       Mild erythema on the buccal mucosa L>R laterally.  Eyes: Conjunctivae and EOM are normal. Pupils are equal, round, and reactive to light. Right eye exhibits no discharge. Left eye exhibits no discharge.  Neck: Normal range of motion. Neck supple.  Cardiovascular: Normal rate and regular rhythm.   Pulmonary/Chest: Effort normal and breath sounds normal. He has no wheezes.  Lymphadenopathy:    He has no cervical adenopathy.  Skin: He is not diaphoretic.          Assessment & Plan:

## 2011-03-28 NOTE — Telephone Encounter (Signed)
Pharmacist notified as instructed by telephone. 

## 2011-03-28 NOTE — Telephone Encounter (Signed)
On your recent Rx for Magic Mouthwash, the pharmacy faxed this question:  Do you just want Mylanta? Or Mylanta mixed with Duke's Mouthwash? In what ratio? Verify quantity.

## 2011-03-28 NOTE — Assessment & Plan Note (Addendum)
Doing ok grossly. Needs to get more active and lose weight. Get off the couch! Lab Results  Component Value Date   HGBA1C 6.1 01/09/2011

## 2011-04-13 DIAGNOSIS — I2510 Atherosclerotic heart disease of native coronary artery without angina pectoris: Secondary | ICD-10-CM

## 2011-04-13 HISTORY — DX: Atherosclerotic heart disease of native coronary artery without angina pectoris: I25.10

## 2011-04-25 ENCOUNTER — Other Ambulatory Visit (INDEPENDENT_AMBULATORY_CARE_PROVIDER_SITE_OTHER): Payer: Medicare Other

## 2011-04-25 DIAGNOSIS — I10 Essential (primary) hypertension: Secondary | ICD-10-CM

## 2011-04-25 DIAGNOSIS — E119 Type 2 diabetes mellitus without complications: Secondary | ICD-10-CM

## 2011-04-25 LAB — BASIC METABOLIC PANEL
CO2: 29 mEq/L (ref 19–32)
Chloride: 106 mEq/L (ref 96–112)
GFR: 51.4 mL/min — ABNORMAL LOW (ref >60.00)
Glucose, Bld: 132 mg/dL — ABNORMAL HIGH (ref 70–99)
Potassium: 4.1 mEq/L (ref 3.5–5.1)
Sodium: 141 mEq/L (ref 135–145)

## 2011-04-26 ENCOUNTER — Inpatient Hospital Stay (HOSPITAL_COMMUNITY)
Admission: EM | Admit: 2011-04-26 | Discharge: 2011-04-30 | DRG: 247 | Disposition: A | Discharge status: Home / Self Care | Payer: Medicare Other | Attending: Cardiology | Admitting: Cardiology

## 2011-04-26 ENCOUNTER — Encounter (HOSPITAL_COMMUNITY): Payer: Self-pay | Admitting: Pharmacy Technician

## 2011-04-26 ENCOUNTER — Other Ambulatory Visit: Payer: Self-pay

## 2011-04-26 ENCOUNTER — Emergency Department (HOSPITAL_COMMUNITY): Payer: Medicare Other

## 2011-04-26 ENCOUNTER — Encounter: Payer: Self-pay | Admitting: *Deleted

## 2011-04-26 ENCOUNTER — Telehealth: Payer: Self-pay | Admitting: Internal Medicine

## 2011-04-26 DIAGNOSIS — Z23 Encounter for immunization: Secondary | ICD-10-CM

## 2011-04-26 DIAGNOSIS — E785 Hyperlipidemia, unspecified: Secondary | ICD-10-CM | POA: Diagnosis present

## 2011-04-26 DIAGNOSIS — I10 Essential (primary) hypertension: Secondary | ICD-10-CM

## 2011-04-26 DIAGNOSIS — I2510 Atherosclerotic heart disease of native coronary artery without angina pectoris: Secondary | ICD-10-CM | POA: Diagnosis present

## 2011-04-26 DIAGNOSIS — N289 Disorder of kidney and ureter, unspecified: Secondary | ICD-10-CM | POA: Diagnosis present

## 2011-04-26 DIAGNOSIS — I214 Non-ST elevation (NSTEMI) myocardial infarction: Principal | ICD-10-CM | POA: Diagnosis present

## 2011-04-26 DIAGNOSIS — I200 Unstable angina: Secondary | ICD-10-CM

## 2011-04-26 HISTORY — DX: Personal history of malignant melanoma of skin: Z85.820

## 2011-04-26 LAB — CBC
HCT: 44.9 % (ref 39.0–52.0)
Hemoglobin: 15.8 g/dL (ref 13.0–17.0)
MCH: 33.3 pg (ref 26.0–34.0)
MCHC: 35.2 g/dL (ref 30.0–36.0)
MCV: 94.5 fL (ref 78.0–100.0)
Platelets: 217 10*3/uL (ref 150–400)
RBC: 4.75 MIL/uL (ref 4.22–5.81)
RDW: 13.4 % (ref 11.5–15.5)
WBC: 8.5 10*3/uL (ref 4.0–10.5)

## 2011-04-26 LAB — BASIC METABOLIC PANEL
BUN: 13 mg/dL (ref 6–23)
CO2: 27 mEq/L (ref 19–32)
Calcium: 9.8 mg/dL (ref 8.4–10.5)
Chloride: 104 mEq/L (ref 96–112)
Creatinine, Ser: 1.3 mg/dL (ref 0.50–1.35)
GFR calc Af Amer: 65 mL/min — ABNORMAL LOW (ref >90)
GFR calc non Af Amer: 56 mL/min — ABNORMAL LOW (ref >90)
Glucose, Bld: 102 mg/dL — ABNORMAL HIGH (ref 70–99)
Potassium: 4.3 mEq/L (ref 3.5–5.1)
Sodium: 140 mEq/L (ref 135–145)

## 2011-04-26 LAB — CARDIAC PANEL(CRET KIN+CKTOT+MB+TROPI)
CK, MB: 18.1 ng/mL (ref 0.3–4.0)
Total CK: 639 U/L — ABNORMAL HIGH (ref 7–232)
Total CK: 658 U/L — ABNORMAL HIGH (ref 7–232)

## 2011-04-26 LAB — TROPONIN I: Troponin I: <0.3 ng/mL (ref <0.30)

## 2011-04-26 LAB — D-DIMER, QUANTITATIVE: D-Dimer, Quant: 0.59 ug/mL-FEU — ABNORMAL HIGH (ref 0.00–0.48)

## 2011-04-26 MED ORDER — ASPIRIN EC 81 MG PO TBEC
81.0000 mg | DELAYED_RELEASE_TABLET | Freq: Every day | ORAL | Status: DC
  Administered 2011-04-27 – 2011-04-29 (×3): 81 mg via ORAL
  Filled 2011-04-26 (×3): qty 1

## 2011-04-26 MED ORDER — SODIUM CHLORIDE 0.9 % IV SOLN: 250.0000 mL | INTRAVENOUS | Status: DC | PRN

## 2011-04-26 MED ORDER — NITROGLYCERIN 2 % TD OINT
1.0000 [in_us] | TOPICAL_OINTMENT | Freq: Four times a day (QID) | TRANSDERMAL | Status: DC
  Administered 2011-04-26: 1 [in_us] via TOPICAL
  Filled 2011-04-26: qty 1

## 2011-04-26 MED ORDER — ONDANSETRON HCL 4 MG/2ML IJ SOLN: 4.0000 mg | Freq: Four times a day (QID) | INTRAMUSCULAR | Status: DC | PRN

## 2011-04-26 MED ORDER — ASPIRIN 81 MG PO CHEW
324.0000 mg | CHEWABLE_TABLET | Freq: Once | ORAL | Status: AC
  Administered 2011-04-26: 324 mg via ORAL
  Filled 2011-04-26: qty 4

## 2011-04-26 MED ORDER — NITROGLYCERIN IN D5W 200-5 MCG/ML-% IV SOLN
2.0000 ug/min | Freq: Once | INTRAVENOUS | Status: AC
  Administered 2011-04-26: 10 ug/min via INTRAVENOUS
  Filled 2011-04-26: qty 250

## 2011-04-26 MED ORDER — RAMIPRIL 10 MG PO CAPS
10.0000 mg | ORAL_CAPSULE | Freq: Two times a day (BID) | ORAL | Status: AC
  Administered 2011-04-26 – 2011-04-28 (×5): 10 mg via ORAL
  Filled 2011-04-26 (×6): qty 1

## 2011-04-26 MED ORDER — HEPARIN BOLUS VIA INFUSION
4000.0000 [IU] | Freq: Once | INTRAVENOUS | Status: AC
  Administered 2011-04-26: 4000 [IU] via INTRAVENOUS
  Filled 2011-04-26: qty 4000

## 2011-04-26 MED ORDER — INFLUENZA VIRUS VACC SPLIT PF IM SUSP
0.5000 mL | INTRAMUSCULAR | Status: AC
  Filled 2011-04-26: qty 0.5

## 2011-04-26 MED ORDER — METOPROLOL TARTRATE 25 MG PO TABS
25.0000 mg | ORAL_TABLET | Freq: Two times a day (BID) | ORAL | Status: DC
  Administered 2011-04-26 – 2011-04-30 (×8): 25 mg via ORAL
  Filled 2011-04-26 (×11): qty 1

## 2011-04-26 MED ORDER — ASPIRIN 81 MG PO CHEW: 324.0000 mg | CHEWABLE_TABLET | ORAL | Status: DC

## 2011-04-26 MED ORDER — SIMVASTATIN 40 MG PO TABS
40.0000 mg | ORAL_TABLET | Freq: Every day | ORAL | Status: DC
  Administered 2011-04-26 – 2011-04-29 (×4): 40 mg via ORAL
  Filled 2011-04-26 (×8): qty 1

## 2011-04-26 MED ORDER — DIAZEPAM 5 MG PO TABS
5.0000 mg | ORAL_TABLET | ORAL | Status: AC
  Administered 2011-04-29: 5 mg via ORAL
  Filled 2011-04-26: qty 1

## 2011-04-26 MED ORDER — SODIUM CHLORIDE 0.9 % IV SOLN
1.0000 mL/kg/h | INTRAVENOUS | Status: DC
  Administered 2011-04-28 – 2011-04-29 (×2): 1 mL/kg/h via INTRAVENOUS

## 2011-04-26 MED ORDER — SODIUM CHLORIDE 0.9 % IJ SOLN: 3.0000 mL | INTRAMUSCULAR | Status: DC | PRN

## 2011-04-26 MED ORDER — SODIUM CHLORIDE 0.9 % IJ SOLN
3.0000 mL | Freq: Two times a day (BID) | INTRAMUSCULAR | Status: DC
  Administered 2011-04-26 – 2011-04-28 (×5): 3 mL via INTRAVENOUS

## 2011-04-26 MED ORDER — ACETAMINOPHEN 325 MG PO TABS: 650.0000 mg | ORAL_TABLET | ORAL | Status: DC | PRN

## 2011-04-26 MED ORDER — NITROGLYCERIN 0.4 MG SL SUBL: 0.4000 mg | SUBLINGUAL_TABLET | SUBLINGUAL | Status: DC | PRN

## 2011-04-26 MED ORDER — HEPARIN SOD (PORCINE) IN D5W 100 UNIT/ML IV SOLN
1350.0000 [IU]/h | INTRAVENOUS | Status: DC
  Administered 2011-04-26 – 2011-04-28 (×4): 1350 [IU]/h via INTRAVENOUS
  Filled 2011-04-26 (×6): qty 250

## 2011-04-26 NOTE — Progress Notes (Signed)
ANTICOAGULATION CONSULT NOTE - Initial Consult  Pharmacy Consult for Heparin Indication: chest pain/ACS, USAP  No Known Allergies  Patient Measurements: Weight: 223 lb (101.152 kg)   Vital Signs: Temp: 98.2 F (36.8 C) (12/14 1455) Temp src: Oral (12/14 1455) BP: 137/65 mmHg (12/14 1704) Pulse Rate: 78  (12/14 1704)  Labs:  Basename 04/26/11 1514  HGB 15.8  HCT 44.9  PLT 217  APTT --  LABPROT --  INR --  HEPARINUNFRC --  CREATININE 1.30  CKTOTAL --  CKMB --  TROPONINI <0.30   CrCl is unknown because there is no height on file for the current visit.  Medical History: Past Medical History  Diagnosis Date  . Hypertension   . High cholesterol   . History of melanoma     s/p resection    Medications:  Prescriptions prior to admission  Medication Sig Dispense Refill  . metoprolol tartrate (LOPRESSOR) 25 MG tablet Take 25 mg by mouth 2 (two) times daily.        . pravastatin (PRAVACHOL) 80 MG tablet Take 80 mg by mouth daily.        . ramipril (ALTACE) 10 MG capsule Take 10 mg by mouth 2 (two) times daily.          Assessment: 65 yo man with intermittent episodes of fatigue and substernal dull chest pain last couple of weeks who presented to ED today with worsening symptoms concerning for USAP to start IV heparin.  Plan cath on Monday. Goal of Therapy:  Heparin level 0.3-0.7 units/ml   Plan:  Heparin 4000 unit bolus and drip at 1350 units/hr.  Check 8 hour heparin level and CBC then daily while on heparin.  Talbert Cage Poteet 04/26/2011,6:14 PM

## 2011-04-26 NOTE — Telephone Encounter (Signed)
Patient called and stated he was having SOB and chest tightening no numbness and he didn't think it was indigestion but  that he wanted to come by here before he goes to the ER to get checked out before he spends all the time in the ER.  Per/Dr. Doctor Para March I informed him we cannot evaluate him here and it is our advise is for him to call 911 and go  go to the ER.  He stated he has been coming here for years and can't believe that one of the doctors can't take 10 minutes out of their time and evaluate him. He was very disappointed.

## 2011-04-26 NOTE — ED Provider Notes (Signed)
I saw and evaluated the patient, reviewed the resident's note and I agree with the findings and plan.  65yM with CP and dyspnea associated with exertion. Hx of HTN and HLD. Concern for unstable angina. EKG reviewed by myself and significant for ST depression anterolaterally. No prior for comparison. Initial trop neg and CXR without acute findings. Plan admit for further wu and eval.  Raeford Razor, MD 04/26/11 [phone]

## 2011-04-26 NOTE — Telephone Encounter (Signed)
For a 65 year old male with h/o HTN, HLD, new onset CP and SOB that he can't attribute to indigestion, it appears reasonable to get eval at ER.  I can't advise him that it's reasonable to spend time coming here on the outside chance he has an ongoing cardiac issue.  This is not about having time to see patient, this is about attempting to provide safe/reasonable care for the patient.  It appears that ER eval would be reasonable.

## 2011-04-26 NOTE — ED Notes (Signed)
Reports sob for several days, more with exertion. Having mid chest tightness that started yesterday, now radiates down left arm. ekg being done, airway intact.

## 2011-04-26 NOTE — ED Notes (Signed)
Pt resting quietly at the time. Remains on cardiac monitor. Wife at bedside. No signs of distress. Pt states 1/10 L arm "tingling."

## 2011-04-26 NOTE — ED Notes (Signed)
Cardiology at the bedside performing consult. Pt remains on cardiac monitor. Vital signs stable. No signs of distress.

## 2011-04-26 NOTE — ED Notes (Signed)
Pt presents to department for evaluation of chest tightness radiating to L arm.  Onset yesterday. Also states SOB, becomes worse on exertion. Pt states he was hanging christmas lights yesterday when he became very "short winded." respirations unlabored at the time. Pt states he becomes very fatigued when attempting to complete daily tasks. Lung sounds clear and equal bilaterally. Pt states 1/10 "tingling" sensation to L arm. He is conscious alert and oriented x4. No signs of distress at the present.

## 2011-04-26 NOTE — Telephone Encounter (Signed)
LMOVM at home number.

## 2011-04-26 NOTE — Progress Notes (Signed)
CRITICAL VALUE ALERT  Critical value received:  CKMB 18.11; Troponin 1.33  Date of notification:  04/26/2011  Time of notification:  1845  Critical value read back:yes  Nurse who received alert:  Nickolas Madrid   MD notified (1st page):  Theodore Demark [facility]  Time of first page:  1850  MD notified (2nd page):  Time of second page:  Responding MD:  Theodore Demark [facility]  Time MD responded:  [phone]

## 2011-04-26 NOTE — H&P (Signed)
History and Physical   Patient ID: Jesse [name] MRN: [id number], DOB/AGE: Jan 04, 1946   Admit date: 04/26/2011 Date of Consult: 04/26/2011   Primary Physician: Laurita Quint, MD Primary Cardiologist: None  Pt. Profile: Jesse [name] is a 65 yo Caucasian male with PMHx significant for HTN and HL who presented to [facility] ED this morning with DOE and chest discomfort.   Problem List: Past Medical History  Diagnosis Date  . Hypertension   . High cholesterol     History reviewed. No pertinent past surgical history.   Allergies: No Known Allergies  HPI:   He reports hanging Christmas lights yesterday when he became markedly short of breath. He would rest for a few minutes with resolution and continue this activity. These episodes continued to occur while exerting himself. He noted associated fatigue and substernal dull chest discomfort with radiation to his left arm described as a "tingling" sensation rated at a 1/10. Each episode lasted <30 min aggravated by exertion and alleviated with rest. These episodes have occurred intermittently for the last couple of weeks. He denies lightheadedness, palpitations, orthopnea, edema, PND, extended travel, fevers, chills, n/v, abdominal pain and cough. He does note exposure to sick relatives at home. Today his discomfort and DOE is worse prompting him to present to the ED.   EKG reveals NSR with ST depression in V4-V6, I and aVL, CXR pending, POC TnI neg x 1, BMET and CBC WNL. He has received full-dose ASA, NTG paste and IV which have relieved his discomfort. BP on initial presentation was 180/114, but is now down and stable at 140/94 post-nitroglycerin administration. He is currently pain free.   Inpatient Medications:     . aspirin  324 mg Oral Once  . nitroGLYCERIN  1 inch Topical Q6H  . nitroGLYCERIN  2-200 mcg/min Intravenous Once    (Not in a hospital admission)  History reviewed. No pertinent family history.   History   Social History   . Marital Status: Married    Spouse Name: N/A    Number of Children: N/A  . Years of Education: N/A   Occupational History  . Not on file.   Social History Main Topics  . Smoking status: Never Smoker   . Smokeless tobacco: Not on file  . Alcohol Use: 3.6 oz/week    6 Cans of beer per week  . Drug Use: No  . Sexually Active:    Other Topics Concern  . Not on file   Social History Narrative  . No narrative on file     Review of Systems: General: negative for chills, fever, night sweats or weight changes.  Cardiovascular: positive for chest pain, dyspnea on exertion, fatigue, negative for edema, orthopnea, palpitations, paroxysmal nocturnal dyspnea or shortness of breath Dermatological: negative for rash Respiratory: negative for cough or wheezing Urologic: negative for hematuria Abdominal: negative for nausea, vomiting, diarrhea, bright red blood per rectum, melena, or hematemesis Neurologic: negative for visual changes, syncope, or dizziness All other systems reviewed and are otherwise negative except as noted above.  Physical Exam: Blood pressure 137/65, pulse 78, temperature 98.2 F (36.8 C), temperature source Oral, resp. rate 19, weight 101.152 kg (223 lb), SpO2 96.00%.    General: Well developed, well nourished, NAD Head: Normocephalic, atraumatic, sclera non-icteric Neck: Negative for carotid bruits. JVD not elevated. Supple.  Lungs: Clear bilaterally to auscultation without wheezes, rales, or rhonchi. Breathing is unlabored. Heart: RRR with S1 S2. No murmurs, heaves, gallops, thrills or rubs appreciated. Abdomen: Soft,  non-tender, non-distended with normoactive bowel sounds. No hepatomegaly. No rebound/guarding. No obvious abdominal masses. Msk:  Strength and tone appears normal for age. Extremities: No clubbing, cyanosis or edema.  Distal pedal pulses are 2+ and equal bilaterally. Neuro: Alert and oriented X 3. Moves all extremities spontaneously. Psych:   Responds to questions appropriately with a normal affect.  Labs: Recent Labs  [facility] 04/26/11 1514   WBC 8.5   HGB 15.8   HCT 44.9   MCV 94.5   PLT 217    Lab 04/26/11 1514  NA 140  K 4.3  CL 104  CO2 27  BUN 13  CREATININE 1.30  CALCIUM 9.8  PROT --  BILITOT --  ALKPHOS --  ALT --  AST --  AMYLASE --  LIPASE --  GLUCOSE 102*    Recent Labs  Basename 04/26/11 1514   CKTOTAL --   CKMB --   CKMBINDEX --   TROPONINI <0.30   Radiology/Studies: Dg Chest 2 View  04/26/2011  *RADIOLOGY REPORT*  Clinical Data: Shortness of breath and chest tightness.  CHEST - 2 VIEW  Comparison: None.  Findings: Trachea is midline.  Heart size normal. Added density in the left perihilar region is felt to be due to vascular and osseous structures overlapping.  Lungs are low in volume but clear.  No pleural fluid.  Old right rib fracture.  Degenerative changes are seen in the spine.  IMPRESSION: Low lung volumes.  No acute findings.  Original Report Authenticated By: Reyes Ivan, M.D.   EKG: NSR, 92 bpm, ST depression V4-V6, I, aVL  ASSESSMENT AND PLAN:   1. Unstable angina- pt's history and symptoms are concerning for unstable angina; although his chest discomfort and DOE are relieved with rest, these episodes have worsened and become more frequent in the last couple of weeks; EKG concerning for lateral ischemia, TnI neg x 1  - Currently chest pain free, will admit to telemetry  - Plan for cath Monday  - Start Heparin gtt  - ASA, continue BB, ACEI, statin  - NTG SL for pain  - NPO midnight  - Will check D-dimer to rule out PE    2. HTN  - Continue outpatient BB, ACEI  - Will monitor 3. HL  - Continue statin therapy  Signed, R. Hurman Horn, PA-C 04/26/2011, 5:19 PM   Patient seen and examined and history reviewed. Agree with above findings and plan. Patient has fairly classic symptoms of unstable angina. Exam is unremarkable other than he appears somewhat flushed. Lungs  clear. Cardiac exam without murmur or gallop. Ecg consistent with lateral ischemia. He has been significantly hypertensive and has a hx of hypercholesterolemia. We will check a d-dimer and if abnormal will get a chest CT. It is more likely that this is USAP. Will admit to telemetry. Check serial enzymes. Start anticoagulation. Continue BB, ACEi, ASA. Plan cardiac cath Monday.  Thedora Hinders 04/26/2011 5:36 PM

## 2011-04-26 NOTE — ED Notes (Signed)
Pt resting quietly at the time. States 1/10 L arm "tingling." nitroglycerin infusion stopped per ED resident. Pt remains on cardiac monitor. Vital signs stable. Consult to cardiology pending. No signs of distress at the time.

## 2011-04-26 NOTE — ED Provider Notes (Signed)
History     CSN: [id number] Arrival date & time: 04/26/2011  2:44 PM   First MD Initiated Contact with Patient 04/26/11 1459      Chief Complaint  Patient presents with  . Chest Pain    (Consider location/radiation/quality/duration/timing/severity/associated sxs/prior treatment) The history is provided by the patient and the spouse.   patient is a 65 year old male history of hypertension and hyperlipidemia who presents with chest pain. He states the last couple weeks he has had intermittent central chest pressure with associated dyspnea. He has also had associated fatigue. He has noted these episodes lasting several minutes intermittently over last couple weeks. It is worse when walking up stairs or exerting himself. It resolves when he stops activity. Today there pain and dyspnea has been worse. He is pain now radiates to his left upper extremity. He's never had this problem before. No prior history of ACS. No prior cardiac evaluation. Overall severity not be moderate. At time of interview, patient notes one out of 10 pain. No recent fever or URI symptoms. No lower extremity swelling.  Past Medical History  Diagnosis Date  . Hypertension   . High cholesterol     History reviewed. No pertinent past surgical history.  History reviewed. No pertinent family history.  History  Substance Use Topics  . Smoking status: Never Smoker   . Smokeless tobacco: Not on file  . Alcohol Use: 3.6 oz/week    6 Cans of beer per week      Review of Systems  Constitutional: Positive for fatigue. Negative for fever, chills and activity change.  HENT: Negative for congestion and neck pain.   Gastrointestinal: Negative for nausea, vomiting, abdominal pain, diarrhea and abdominal distention.  Genitourinary: Negative for difficulty urinating.  Musculoskeletal: Negative for gait problem.  Skin: Negative for rash.  Neurological: Negative for weakness and numbness.  Psychiatric/Behavioral: Negative  for behavioral problems and confusion.  All other systems reviewed and are negative.    Allergies  Review of patient's allergies indicates no known allergies.  Home Medications   Current Outpatient Rx  Name Route Sig Dispense Refill  . METOPROLOL TARTRATE 25 MG PO TABS Oral Take 25 mg by mouth 2 (two) times daily.      Marland Kitchen PRAVASTATIN SODIUM 80 MG PO TABS Oral Take 80 mg by mouth daily.      Marland Kitchen RAMIPRIL 10 MG PO CAPS Oral Take 10 mg by mouth 2 (two) times daily.        BP 140/94  Pulse 85  Temp(Src) 98.2 F (36.8 C) (Oral)  Resp 18  Wt 223 lb (101.152 kg)  SpO2 99%  Physical Exam  Nursing note and vitals reviewed. Constitutional: He is oriented to person, place, and time. He appears well-developed and well-nourished. No distress.  HENT:  Head: Normocephalic.  Nose: Nose normal.  Eyes: EOM are normal. Pupils are equal, round, and reactive to light.  Neck: Normal range of motion. Neck supple. No JVD present.  Cardiovascular: Normal rate, regular rhythm and intact distal pulses.   No murmur heard. Pulmonary/Chest: Effort normal and breath sounds normal. No respiratory distress. He exhibits no tenderness.  Abdominal: Soft. Bowel sounds are normal. He exhibits no distension. There is no tenderness.  Musculoskeletal: Normal range of motion. He exhibits no edema and no tenderness.       No calf ttp  Neurological: He is alert and oriented to person, place, and time.       Normal strength  Skin: Skin is warm and  dry. He is not diaphoretic.  Psychiatric: He has a normal mood and affect. His behavior is normal. Thought content normal.    ED Course  Procedures (including critical care time)   Date: 04/26/2011  Rate: 92  Rhythm: normal sinus rhythm  QRS Axis: left  Intervals: normal  ST/T Wave abnormalities: ST depressions laterally  Conduction Disutrbances:none  Narrative Interpretation: sinus rhythm with ST depression in lateral leads  Old EKG Reviewed: none  available    Labs Reviewed  BASIC METABOLIC PANEL - Abnormal; Notable for the following:    Glucose, Bld 102 (*)    GFR calc non Af Amer 56 (*)    GFR calc Af Amer 65 (*)    All other components within normal limits  CBC  TROPONIN I   No results found.   1. Unstable angina       MDM   EKG shows mild lateral ST depression. Chest x-ray and lab work unremarkable. Patient asymptomatic after nitroglycerin in the ED. Initial pressure elevated, however room systolic pressure in 160s and decreased after nitroglycerin. This does not appear to be hypertensive urgency. Overall clinical picture concerning for unstable angina. Discussed with cardiology and they will admit.  Pt H-D stable with no significant episodes of CP while in ED.        [name] 04/26/11 1841

## 2011-04-27 ENCOUNTER — Other Ambulatory Visit: Payer: Self-pay

## 2011-04-27 LAB — CARDIAC PANEL(CRET KIN+CKTOT+MB+TROPI)
CK, MB: 17.6 ng/mL (ref 0.3–4.0)
Relative Index: 3.3 — ABNORMAL HIGH (ref 0.0–2.5)
Total CK: 526 U/L — ABNORMAL HIGH (ref 7–232)

## 2011-04-27 LAB — CBC
Platelets: 184 10*3/uL (ref 150–400)
RBC: 4.19 MIL/uL — ABNORMAL LOW (ref 4.22–5.81)
WBC: 9.1 10*3/uL (ref 4.0–10.5)

## 2011-04-27 LAB — BASIC METABOLIC PANEL
CO2: 27 mEq/L (ref 19–32)
Calcium: 8.8 mg/dL (ref 8.4–10.5)
GFR calc Af Amer: 53 mL/min — ABNORMAL LOW (ref >90)
GFR calc non Af Amer: 46 mL/min — ABNORMAL LOW (ref >90)
Sodium: 138 mEq/L (ref 135–145)

## 2011-04-27 NOTE — Progress Notes (Signed)
SUBJECTIVE:  No further chest pain   PHYSICAL EXAM Filed Vitals:   04/26/11 1704 04/26/11 1814 04/26/11 2058 04/27/11 0522  BP: 137/65 140/84 150/73 113/66  Pulse: 78 79 84 67  Temp:  97.6 F (36.4 C) 98.9 F (37.2 C) 97.3 F (36.3 C)  TempSrc:  Oral Oral Oral  Resp: 19 19 19 19   Height: 5\' 10"  (1.778 m)     Weight: 105.688 kg (233 lb)     SpO2: 96% 95% 93% 92%   General:  No distress Lungs:  Clear Heart:  RRR Abdomen:  Positive bowel sounds, no rebound no guarding Extremities:  No edema  LABS: Lab Results  Component Value Date   CKTOTAL 526* 04/26/2011   CKMB 17.6* 04/26/2011   TROPONINI 2.51* 04/26/2011   Results for orders placed during the hospital encounter of 04/26/11 (from the past 24 hour(s))  CBC     Status: Normal   Collection Time   04/26/11  3:14 PM      Component Value Range   WBC 8.5  4.0 - 10.5 (K/uL)   RBC 4.75  4.22 - 5.81 (MIL/uL)   Hemoglobin 15.8  13.0 - 17.0 (g/dL)   HCT 16.1  09.6 - 04.5 (%)   MCV 94.5  78.0 - 100.0 (fL)   MCH 33.3  26.0 - 34.0 (pg)   MCHC 35.2  30.0 - 36.0 (g/dL)   RDW 40.9  81.1 - 91.4 (%)   Platelets 217  150 - 400 (K/uL)  TROPONIN I     Status: Normal   Collection Time   04/26/11  3:14 PM      Component Value Range   Troponin I <0.30  <0.30 (ng/mL)  BASIC METABOLIC PANEL     Status: Abnormal   Collection Time   04/26/11  3:14 PM      Component Value Range   Sodium 140  135 - 145 (mEq/L)   Potassium 4.3  3.5 - 5.1 (mEq/L)   Chloride 104  96 - 112 (mEq/L)   CO2 27  19 - 32 (mEq/L)   Glucose, Bld 102 (*) 70 - 99 (mg/dL)   BUN 13  6 - 23 (mg/dL)   Creatinine, Ser 7.82  0.50 - 1.35 (mg/dL)   Calcium 9.8  8.4 - 95.6 (mg/dL)   GFR calc non Af Amer 56 (*) >90 (mL/min)   GFR calc Af Amer 65 (*) >90 (mL/min)  D-DIMER, QUANTITATIVE     Status: Abnormal   Collection Time   04/26/11  5:51 PM      Component Value Range   D-Dimer, Quant 0.59 (*) 0.00 - 0.48 (ug/mL-FEU)  CARDIAC PANEL(CRET KIN+CKTOT+MB+TROPI)      Status: Abnormal   Collection Time   04/26/11  5:56 PM      Component Value Range   Total CK 639 (*) 7 - 232 (U/L)   CK, MB 18.1 (*) 0.3 - 4.0 (ng/mL)   Troponin I 1.33 (*) <0.30 (ng/mL)   Relative Index 2.8 (*) 0.0 - 2.5   CARDIAC PANEL(CRET KIN+CKTOT+MB+TROPI)     Status: Abnormal   Collection Time   04/26/11  6:12 PM      Component Value Range   Total CK 658 (*) 7 - 232 (U/L)   CK, MB 17.8 (*) 0.3 - 4.0 (ng/mL)   Troponin I 1.32 (*) <0.30 (ng/mL)   Relative Index 2.7 (*) 0.0 - 2.5   CARDIAC PANEL(CRET KIN+CKTOT+MB+TROPI)     Status: Abnormal   Collection Time  04/26/11 11:50 PM      Component Value Range   Total CK 526 (*) 7 - 232 (U/L)   CK, MB 17.6 (*) 0.3 - 4.0 (ng/mL)   Troponin I 2.51 (*) <0.30 (ng/mL)   Relative Index 3.3 (*) 0.0 - 2.5   HEPARIN LEVEL (UNFRACTIONATED)     Status: Normal   Collection Time   04/27/11  1:59 AM      Component Value Range   Heparin Unfractionated 0.35  0.30 - 0.70 (IU/mL)  BASIC METABOLIC PANEL     Status: Abnormal   Collection Time   04/27/11  5:20 AM      Component Value Range   Sodium 138  135 - 145 (mEq/L)   Potassium 4.3  3.5 - 5.1 (mEq/L)   Chloride 104  96 - 112 (mEq/L)   CO2 27  19 - 32 (mEq/L)   Glucose, Bld 115 (*) 70 - 99 (mg/dL)   BUN 17  6 - 23 (mg/dL)   Creatinine, Ser 1.61 (*) 0.50 - 1.35 (mg/dL)   Calcium 8.8  8.4 - 09.6 (mg/dL)   GFR calc non Af Amer 46 (*) >90 (mL/min)   GFR calc Af Amer 53 (*) >90 (mL/min)  CBC     Status: Abnormal   Collection Time   04/27/11  5:20 AM      Component Value Range   WBC 9.1  4.0 - 10.5 (K/uL)   RBC 4.19 (*) 4.22 - 5.81 (MIL/uL)   Hemoglobin 13.4  13.0 - 17.0 (g/dL)   HCT 04.5  40.9 - 81.1 (%)   MCV 95.2  78.0 - 100.0 (fL)   MCH 32.0  26.0 - 34.0 (pg)   MCHC 33.6  30.0 - 36.0 (g/dL)   RDW 91.4  78.2 - 95.6 (%)   Platelets 184  150 - 400 (K/uL)    Intake/Output Summary (Last 24 hours) at 04/27/11 2130 Last data filed at 04/27/11 8657  Gross per 24 hour  Intake    240 ml   Output      0 ml  Net    240 ml     ASSESSMENT AND PLAN:  1)  Chest pain:  Cath on Monday.  Ruled in.  Continue to cycle enzymes.    2)  Hyperlipidemia:  I don't see a lipid profile and I will draw one.  3)  HTN:  BP OK. Continue current meds  4)  RI:  Check BMET in am.  Needs hydration before cath. I will hold the ACE the day of the cath.   Jesse [name] [facility] 04/27/2011 9:24 AM

## 2011-04-27 NOTE — Progress Notes (Signed)
ANTICOAGULATION CONSULT NOTE - Follow Up Consult  Pharmacy Consult for heparin Indication: chest pain/ACS and USAP  No Known Allergies  Patient Measurements: Height: 5\' 10"  (177.8 cm) Weight: 233 lb (105.688 kg) IBW/kg (Calculated) : 73   Vital Signs: Temp: 98.9 F (37.2 C) (12/14 2058) Temp src: Oral (12/14 2058) BP: 150/73 mmHg (12/14 2058) Pulse Rate: 84  (12/14 2058)  Labs:  Basename 04/27/11 0159 04/26/11 2350 04/26/11 1812 04/26/11 1756 04/26/11 1514  HGB -- -- -- -- 15.8  HCT -- -- -- -- 44.9  PLT -- -- -- -- 217  APTT -- -- -- -- --  LABPROT -- -- -- -- --  INR -- -- -- -- --  HEPARINUNFRC 0.35 -- -- -- --  CREATININE -- -- -- -- 1.30  CKTOTAL -- 526* 658* 639* --  CKMB -- 17.6* 17.8* 18.1* --  TROPONINI -- 2.51* 1.32* 1.33* --   Estimated Creatinine Clearance: 69 ml/min (by C-G formula based on Cr of 1.3).   Medications:  Infusions:    . sodium chloride    . heparin 1,350 Units/hr (04/26/11 1856)    Assessment: 65 yom started on IV heparin for CP. Heparin level is therapeutic at 0.35. No bleeding or other problems noted.   Goal of Therapy:  Heparin level 0.3-0.7 units/ml   Plan:  Continue heparin at 1350units/hr Check 6 hour level to confirm  Tanelle Lanzo, Drake Leach 04/27/2011,3:02 AM

## 2011-04-27 NOTE — Progress Notes (Signed)
ANTICOAGULATION CONSULT NOTE - Follow Up Consult  Pharmacy Consult for heparin Indication: chest pain/ACS and USAP  No Known Allergies  Patient Measurements: Height: 5\' 10"  (177.8 cm) Weight: 233 lb (105.688 kg) IBW/kg (Calculated) : 73   Vital Signs: Temp: 97.3 F (36.3 C) (12/15 0522) Temp src: Oral (12/15 0522) BP: 113/66 mmHg (12/15 0522) Pulse Rate: 67  (12/15 0522)  Labs:  Basename 04/27/11 0850 04/27/11 0520 04/27/11 0159 04/26/11 2350 04/26/11 1812 04/26/11 1756 04/26/11 1514  HGB -- 13.4 -- -- -- -- 15.8  HCT -- 39.9 -- -- -- -- 44.9  PLT -- 184 -- -- -- -- 217  APTT -- -- -- -- -- -- --  LABPROT -- -- -- -- -- -- --  INR -- -- -- -- -- -- --  HEPARINUNFRC 0.58 -- 0.35 -- -- -- --  CREATININE -- 1.54* -- -- -- -- 1.30  CKTOTAL -- -- -- 526* 658* 639* --  CKMB -- -- -- 17.6* 17.8* 18.1* --  TROPONINI -- -- -- 2.51* 1.32* 1.33* --   Estimated Creatinine Clearance: 58.2 ml/min (by C-G formula based on Cr of 1.54).   Medications:  Infusions:     . sodium chloride    . heparin 1,350 Units/hr (04/27/11 1610)    Assessment: 65 yom started on IV heparin for CP. Heparin level is therapeutic at 0.58. No bleeding or other problems noted.   Goal of Therapy:  Heparin level 0.3-0.7 units/ml   Plan:  Continue heparin at 1350units/hr Follow up in AM  Elwin Sleight 04/27/2011,11:06 AM

## 2011-04-28 ENCOUNTER — Other Ambulatory Visit: Payer: Self-pay

## 2011-04-28 DIAGNOSIS — I517 Cardiomegaly: Secondary | ICD-10-CM

## 2011-04-28 LAB — CBC
HCT: 41.7 % (ref 39.0–52.0)
MCH: 33.3 pg (ref 26.0–34.0)
MCHC: 35 g/dL (ref 30.0–36.0)
MCV: 95.2 fL (ref 78.0–100.0)
Platelets: 189 10*3/uL (ref 150–400)
RDW: 13.4 % (ref 11.5–15.5)
WBC: 7.6 10*3/uL (ref 4.0–10.5)

## 2011-04-28 LAB — LIPID PANEL
Cholesterol: 160 mg/dL (ref 0–200)
HDL: 43 mg/dL (ref >39)
LDL Cholesterol: 95 mg/dL (ref 0–99)
Total CHOL/HDL Ratio: 3.7 RATIO
Triglycerides: 110 mg/dL (ref <150)
VLDL: 22 mg/dL (ref 0–40)

## 2011-04-28 LAB — BASIC METABOLIC PANEL
BUN: 14 mg/dL (ref 6–23)
Calcium: 9.4 mg/dL (ref 8.4–10.5)
Chloride: 104 mEq/L (ref 96–112)
Creatinine, Ser: 1.26 mg/dL (ref 0.50–1.35)
GFR calc Af Amer: 67 mL/min — ABNORMAL LOW (ref >90)

## 2011-04-28 MED ORDER — TICAGRELOR 90 MG PO TABS
180.0000 mg | ORAL_TABLET | Freq: Once | ORAL | Status: AC
  Administered 2011-04-28: 180 mg via ORAL
  Filled 2011-04-28: qty 2

## 2011-04-28 MED ORDER — TICAGRELOR 90 MG PO TABS
90.0000 mg | ORAL_TABLET | Freq: Two times a day (BID) | ORAL | Status: DC
  Administered 2011-04-29 – 2011-04-30 (×3): 90 mg via ORAL
  Filled 2011-04-28 (×4): qty 1

## 2011-04-28 NOTE — Progress Notes (Signed)
   SUBJECTIVE:  He did have chest pain walking around the room yesterday.  Currently no complaints   PHYSICAL EXAM Filed Vitals:   04/27/11 0522 04/27/11 1357 04/27/11 2115 04/28/11 0516  BP: 113/66 150/87 141/75 140/85  Pulse: 67 70 69 61  Temp: 97.3 F (36.3 C) 98.7 F (37.1 C) 97.9 F (36.6 C) 98.3 F (36.8 C)  TempSrc: Oral Oral Oral Oral  Resp: 19 18 18 18   Height:      Weight:    105.4 kg (232 lb 5.8 oz)  SpO2: 92% 92%  93%   General:  No distress Lungs:  Clear Heart:  RRR Abdomen:  Positive bowel sounds, no rebound no guarding Extremities:  No edema  LABS: Lab Results  Component Value Date   CKTOTAL 257* 04/28/2011   CKMB 7.9* 04/28/2011   TROPONINI 1.33* 04/28/2011   Results for orders placed during the hospital encounter of 04/26/11 (from the past 24 hour(s))  CBC     Status: Normal   Collection Time   04/28/11  5:00 AM      Component Value Range   WBC 7.6  4.0 - 10.5 (K/uL)   RBC 4.38  4.22 - 5.81 (MIL/uL)   Hemoglobin 14.6  13.0 - 17.0 (g/dL)   HCT 54.0  98.1 - 19.1 (%)   MCV 95.2  78.0 - 100.0 (fL)   MCH 33.3  26.0 - 34.0 (pg)   MCHC 35.0  30.0 - 36.0 (g/dL)   RDW 47.8  29.5 - 62.1 (%)   Platelets 189  150 - 400 (K/uL)  HEPARIN LEVEL (UNFRACTIONATED)     Status: Normal   Collection Time   04/28/11  5:00 AM      Component Value Range   Heparin Unfractionated 0.48  0.30 - 0.70 (IU/mL)  CARDIAC PANEL(CRET KIN+CKTOT+MB+TROPI)     Status: Abnormal   Collection Time   04/28/11  5:00 AM      Component Value Range   Total CK 257 (*) 7 - 232 (U/L)   CK, MB 7.9 (*) 0.3 - 4.0 (ng/mL)   Troponin I 1.33 (*) <0.30 (ng/mL)   Relative Index 3.1 (*) 0.0 - 2.5   LIPID PANEL     Status: Normal   Collection Time   04/28/11  5:00 AM      Component Value Range   Cholesterol 160  0 - 200 (mg/dL)   Triglycerides 308  <657 (mg/dL)   HDL 43  >84 (mg/dL)   Total CHOL/HDL Ratio 3.7     VLDL 22  0 - 40 (mg/dL)   LDL Cholesterol 95  0 - 99 (mg/dL)   No intake or  output data in the 24 hours ending 04/28/11 1031   ASSESSMENT AND PLAN:  1)  Chest pain:  Cath on Monday.  Ruled in.  Enzymes trending down.  Given ongoing pain I will start Brilinta.  2)  Hyperlipidemia: LDL 95.  Continue statin  3)  HTN:  BP OK. Continue current meds  4)  RI:  Check BMET in am.  Needs hydration before cath. I will hold the ACE the day of the cath. The orders are in that hydration should start at 8 pm tonight.  Fayrene Fearing [facility] 04/28/2011 10:31 AM

## 2011-04-28 NOTE — Progress Notes (Signed)
  Echocardiogram 2D Echocardiogram has been performed.  Jesse [name], [place] 04/28/2011, 4:05 PM

## 2011-04-28 NOTE — ED Provider Notes (Signed)
I saw and evaluated the patient, reviewed the resident's note and I agree with the findings and plan.  65yM with CP and dyspnea with exertion. Concern for unstable angina. PE relatively unremarkable.  EKG with ischemic changes. Trop neg. Admit for further tx and eval.  Raeford Razor, MD 04/28/11 [phone]

## 2011-04-28 NOTE — Progress Notes (Signed)
ANTICOAGULATION CONSULT NOTE - Follow Up Consult  Pharmacy Consult for heparin Indication: chest pain/ACS and USAP  No Known Allergies  Patient Measurements: Height: 5\' 10"  (177.8 cm) Weight: 232 lb 5.8 oz (105.4 kg) IBW/kg (Calculated) : 73   Vital Signs: Temp: 98.3 F (36.8 C) (12/16 0516) Temp src: Oral (12/16 0516) BP: 140/85 mmHg (12/16 0516) Pulse Rate: 61  (12/16 0516)  Labs:  Basename 04/28/11 0500 04/27/11 0850 04/27/11 0520 04/27/11 0159 04/26/11 2350 04/26/11 1812 04/26/11 1514  HGB 14.6 -- 13.4 -- -- -- --  HCT 41.7 -- 39.9 -- -- -- 44.9  PLT 189 -- 184 -- -- -- 217  APTT -- -- -- -- -- -- --  LABPROT -- -- -- -- -- -- --  INR -- -- -- -- -- -- --  HEPARINUNFRC 0.48 0.58 -- 0.35 -- -- --  CREATININE -- -- 1.54* -- -- -- 1.30  CKTOTAL 257* -- -- -- 526* 658* --  CKMB 7.9* -- -- -- 17.6* 17.8* --  TROPONINI 1.33* -- -- -- 2.51* 1.32* --   Estimated Creatinine Clearance: 58.2 ml/min (by C-G formula based on Cr of 1.54).   Medications:  Infusions:     . sodium chloride    . heparin 1,350 Units/hr (04/28/11 0248)    Assessment: 65 yom started on IV heparin for CP. Heparin level is therapeutic at 0.48. No bleeding or other problems noted.  Cath planned for Monday  Goal of Therapy:  Heparin level 0.3-0.7 units/ml   Plan:  Continue heparin at 1350units/hr Follow up in AM  Elwin Sleight 04/28/2011,11:21 AM

## 2011-04-29 ENCOUNTER — Ambulatory Visit (HOSPITAL_COMMUNITY): Admission: RE | Admit: 2011-04-29 | Payer: Medicare Other | Source: Ambulatory Visit | Admitting: Cardiovascular Disease

## 2011-04-29 ENCOUNTER — Encounter (HOSPITAL_COMMUNITY)
Admission: EM | Disposition: A | Discharge status: Home / Self Care | Payer: Self-pay | Source: Home / Self Care | Attending: Cardiology

## 2011-04-29 ENCOUNTER — Telehealth: Payer: Self-pay | Admitting: Family Medicine

## 2011-04-29 ENCOUNTER — Encounter (HOSPITAL_COMMUNITY): Admission: RE | Payer: Self-pay | Source: Ambulatory Visit

## 2011-04-29 ENCOUNTER — Other Ambulatory Visit: Payer: Self-pay

## 2011-04-29 DIAGNOSIS — I2510 Atherosclerotic heart disease of native coronary artery without angina pectoris: Secondary | ICD-10-CM

## 2011-04-29 HISTORY — PX: LEFT HEART CATHETERIZATION WITH CORONARY ANGIOGRAM: SHX5451

## 2011-04-29 HISTORY — PX: CORONARY STENT PLACEMENT: SHX1402

## 2011-04-29 HISTORY — PX: PERCUTANEOUS CORONARY STENT INTERVENTION (PCI-S): SHX5485

## 2011-04-29 LAB — CBC
HCT: 40.8 % (ref 39.0–52.0)
MCHC: 34.8 g/dL (ref 30.0–36.0)
Platelets: 190 10*3/uL (ref 150–400)
RDW: 13.3 % (ref 11.5–15.5)
WBC: 7.6 10*3/uL (ref 4.0–10.5)

## 2011-04-29 LAB — BASIC METABOLIC PANEL
BUN: 12 mg/dL (ref 6–23)
Calcium: 9.2 mg/dL (ref 8.4–10.5)
GFR calc Af Amer: 62 mL/min — ABNORMAL LOW (ref >90)
GFR calc non Af Amer: 54 mL/min — ABNORMAL LOW (ref >90)
Glucose, Bld: 109 mg/dL — ABNORMAL HIGH (ref 70–99)
Potassium: 3.7 mEq/L (ref 3.5–5.1)
Sodium: 140 mEq/L (ref 135–145)

## 2011-04-29 LAB — GLUCOSE, CAPILLARY: Glucose-Capillary: 114 mg/dL — ABNORMAL HIGH (ref 70–99)

## 2011-04-29 LAB — POCT ACTIVATED CLOTTING TIME: Activated Clotting Time: 501 seconds

## 2011-04-29 SURGERY — LEFT HEART CATHETERIZATION WITH CORONARY ANGIOGRAM
Anesthesia: LOCAL

## 2011-04-29 MED ORDER — VERAPAMIL HCL 2.5 MG/ML IV SOLN
INTRAVENOUS | Status: AC
  Filled 2011-04-29: qty 2

## 2011-04-29 MED ORDER — NITROGLYCERIN 0.2 MG/ML ON CALL CATH LAB
INTRAVENOUS | Status: AC
  Filled 2011-04-29: qty 1

## 2011-04-29 MED ORDER — LIDOCAINE HCL (PF) 1 % IJ SOLN
INTRAMUSCULAR | Status: AC
  Filled 2011-04-29: qty 30

## 2011-04-29 MED ORDER — ASPIRIN 81 MG PO CHEW
81.0000 mg | CHEWABLE_TABLET | Freq: Every day | ORAL | Status: DC
  Administered 2011-04-30: 81 mg via ORAL
  Filled 2011-04-29: qty 1

## 2011-04-29 MED ORDER — MORPHINE SULFATE 2 MG/ML IJ SOLN
2.0000 mg | INTRAMUSCULAR | Status: DC | PRN
  Administered 2011-04-29: 2 mg via INTRAVENOUS

## 2011-04-29 MED ORDER — BIVALIRUDIN 250 MG IV SOLR
INTRAVENOUS | Status: AC
  Filled 2011-04-29: qty 250

## 2011-04-29 MED ORDER — MORPHINE SULFATE 2 MG/ML IJ SOLN
1.0000 mg | INTRAMUSCULAR | Status: DC | PRN
  Administered 2011-04-29: 1 mg via INTRAVENOUS
  Filled 2011-04-29 (×2): qty 1

## 2011-04-29 MED ORDER — TICAGRELOR 90 MG PO TABS
ORAL_TABLET | ORAL | Status: AC
  Filled 2011-04-29: qty 1

## 2011-04-29 MED ORDER — HEPARIN (PORCINE) IN NACL 2-0.9 UNIT/ML-% IJ SOLN
INTRAMUSCULAR | Status: AC
  Filled 2011-04-29: qty 2000

## 2011-04-29 MED ORDER — SODIUM CHLORIDE 0.9 % IV SOLN
INTRAVENOUS | Status: AC
  Administered 2011-04-29: 14:00:00 via INTRAVENOUS

## 2011-04-29 MED ORDER — MIDAZOLAM HCL 2 MG/2ML IJ SOLN
INTRAMUSCULAR | Status: AC
  Filled 2011-04-29: qty 2

## 2011-04-29 MED ORDER — AMLODIPINE BESYLATE 5 MG PO TABS
5.0000 mg | ORAL_TABLET | Freq: Every day | ORAL | Status: DC
  Administered 2011-04-29 – 2011-04-30 (×3): 5 mg via ORAL
  Filled 2011-04-29 (×2): qty 1

## 2011-04-29 MED ORDER — ACETAMINOPHEN 325 MG PO TABS: 650.0000 mg | ORAL_TABLET | ORAL | Status: DC | PRN

## 2011-04-29 MED ORDER — FENTANYL CITRATE 0.05 MG/ML IJ SOLN
INTRAMUSCULAR | Status: AC
  Filled 2011-04-29: qty 2

## 2011-04-29 MED ORDER — HEPARIN SODIUM (PORCINE) 1000 UNIT/ML IJ SOLN
INTRAMUSCULAR | Status: AC
  Filled 2011-04-29: qty 1

## 2011-04-29 MED ORDER — ONDANSETRON HCL 4 MG/2ML IJ SOLN: 4.0000 mg | Freq: Four times a day (QID) | INTRAMUSCULAR | Status: DC | PRN

## 2011-04-29 NOTE — Interval H&P Note (Signed)
History and Physical Interval Note:  04/29/2011 11:08 AM  Jesse [name]  has presented today for surgery, with the diagnosis of chest pain  The various methods of treatment have been discussed with the patient and family. After consideration of risks, benefits and other options for treatment, the patient has consented to  Procedure(s): LEFT HEART CATHETERIZATION WITH CORONARY ANGIOGRAM as a surgical intervention .  The patients' history has been reviewed, patient examined, no change in status, stable for surgery.  I have reviewed the patients' chart and labs.  Questions were answered to the patient's satisfaction.     Charlton Haws 04/29/2011

## 2011-04-29 NOTE — Interval H&P Note (Signed)
History and Physical Interval Note:  04/29/2011 10:30 AM  Jesse [name]  has presented today for surgery, with the diagnosis of chest pain  The various methods of treatment have been discussed with the patient and family. After consideration of risks, benefits and other options for treatment, the patient has consented to  Procedure(s): LEFT HEART CATHETERIZATION WITH CORONARY ANGIOGRAM as a surgical intervention .  The patients' history has been reviewed, patient examined, no change in status, stable for surgery.  I have reviewed the patients' chart and labs.  Questions were answered to the patient's satisfaction.     Charlton Haws 10:30 AM 04/29/2011

## 2011-04-29 NOTE — Op Note (Signed)
CARDIAC CATH NOTE  Name: Jesse [name] MRN: [id number] DOB: 1946-02-15  Procedure: PTCA and stenting of the RCA  Indication: Non STEMI.  See my note addend to note of Dr. Eden Emms  Procedural Details:Catheterization had been carried out by Dr. Eden Emms with a 64F sheath. After the decision was made to perform PCI, a 6 Fr sheath was exchanged into the radial artery. 3 mg verapamil was administered through the radial sheath. Weight-based bivalirudin was given for anticoagulation. Once a therapeutic ACT was achieved, a 6 [place] JR 4 GC with [facility] guide catheter was inserted.  A Traverse coronary guidewire was used to cross the lesion.  The lesion was predilated with a 3.0 mm  balloon.  The lesion was then stented with a 3.5 by 28 Promus Element DES  stent.  The stent was postdilated with a 3.75 compliant balloon after we had difficulty passing both an [place] Sprinter, and [place] Apex.   Initial dilatations were done at 13-14 atm, so there was good stent expansion, and post dil done using a slightly upsized balloon at lower pressure.  Following PCI, there was 0% residual stenosis and TIMI-3 flow. Final angiography confirmed an excellent result.  There did appear to have in one view a small amount of probably thrombus more proximally behind the stent.  Because of the problems getting an [place] balloon back across, and stent type  (potential stent deformation), IVUS was not performed.   The patient tolerated the procedure well. There were no immediate procedural complications. A TR band was used for radial hemostasis. The patient was transferred to the post catheterization recovery area for further monitoring.  I reviewed the images with the patient's spouse and sister  (who works in the lab).    Lesion Data: Vessel: RCA Percent stenosis (pre): 99% TIMI-flow (pre):  3 Stent:  3.5 by 28 Promus Element DES Percent stenosis (post): 0% TIMI-flow (post): 3 Small thrombus behind proximal stent at completion.  See  Text  Conclusions: Implantation of DES in the RCA for non STEMI  Disposition:  1.  Aggressive medical therapy 2.  DAPT 3.  May benefit from exercise GXT with imaging to assess LAD in OP clinic       Shawnie Pons  MD, [facility], [facility] 04/29/2011, 1:40 PM

## 2011-04-29 NOTE — Telephone Encounter (Signed)
Patients wife called, says patient had a heart attach, he is still in the hospital awaiting a Catalazation..Jesse [name] .

## 2011-04-29 NOTE — Progress Notes (Signed)
Patient ID: Jesse [name], male   DOB: [birth year], 65 y.o.   MRN: [id number] Stable post PCI.  O2 Sat 98%.  R hand warm and well perfused.  No complications.   Ambulate in am.  Small amount of thrombus behind stent vs tissue prolapse.  However, continue current meds and ambulate.  Shawnie Pons 04/29/2011 6:07 PM

## 2011-04-29 NOTE — Progress Notes (Signed)
ANTICOAGULATION CONSULT NOTE - Follow Up Consult  Pharmacy Consult for Heparin Indication: chest pain/ACS  No Known Allergies  Patient Measurements: Height: 5\' 10"  (177.8 cm) Weight: 230 lb 11.2 oz (104.645 kg) IBW/kg (Calculated) : 73  Adjusted Body Weight:   Vital Signs: Temp: 97.4 F (36.3 C) (12/17 0423) Temp src: Oral (12/17 0423) BP: 134/80 mmHg (12/17 0423) Pulse Rate: 63  (12/17 0423)  Labs:  Basename 04/29/11 0630 04/28/11 1111 04/28/11 0500 04/27/11 0850 04/27/11 0520 04/26/11 2350 04/26/11 1812  HGB 14.2 -- 14.6 -- -- -- --  HCT 40.8 -- 41.7 -- 39.9 -- --  PLT 190 -- 189 -- 184 -- --  APTT -- -- -- -- -- -- --  LABPROT -- -- -- -- -- -- --  INR -- -- -- -- -- -- --  HEPARINUNFRC 0.33 -- 0.48 0.58 -- -- --  CREATININE 1.35 1.26 -- -- 1.54* -- --  CKTOTAL -- -- 257* -- -- 526* 658*  CKMB -- -- 7.9* -- -- 17.6* 17.8*  TROPONINI -- -- 1.33* -- -- 2.51* 1.32*   Estimated Creatinine Clearance: 66 ml/min (by C-G formula based on Cr of 1.35).  Assessment: Patient is a 65 y.o M on heparin for CP with plan for cardiac cath procedure today.  Heparin level is therapeutic at 0.33.  Goal of Therapy:  Heparin level 0.3-0.7 units/ml   Plan:  1) Continue heparin drip at 1350 units/hr 2) F/u after cath procedure  Fue Cervenka P 04/29/2011,9:22 AM

## 2011-04-29 NOTE — Progress Notes (Signed)
TR BAND REMOVAL  LOCATION:  right radial  DEFLATED PER PROTOCOL:  yes  TIME BAND OFF / DRESSING APPLIED:   1730   SITE UPON ARRIVAL:   Level 0  SITE AFTER BAND REMOVAL:  Level 0  REVERSE ALLEN'S TEST:    positive  CIRCULATION SENSATION AND MOVEMENT:  Within Normal Limits  yes  COMMENTS:    

## 2011-04-29 NOTE — Op Note (Addendum)
Cardiac Catheterization Procedure Note  Name: Jesse [name] MRN: [id number] DOB: December 27, 1945  Procedure: Left Heart Cath, Selective Coronary Angiography, LV angiography  Indication:  SEMI   Procedural Details: The right wrist was prepped, draped, and anesthetized with 1% lidocaine. Using the modified Seldinger technique, a 5 French sheath was introduced into the right radial artery. 3 mg of verapamil was administered through the sheath, weight-based unfractionated heparin was administered intravenously. Standard Judkins catheters were used for selective coronary angiography and left ventriculography. Catheter exchanges were performed over an exchange length guidewire. There were no immediate procedural complications. A TR band was used for radial hemostasis at the completion of the procedure.  The patient was transferred to the post catheterization recovery area for further monitoring.  Procedural Findings: Hemodynamics: AO  125 72 LV 144 14  Coronary angiography: Coronary dominance: right  Left mainstem: Normal  Left anterior descending (LAD): diffuse disease in mid vessel.  60% multiple lesions with some bridging  IM- normal  Left circumflex (LCx): 30% multiple discrete  Right coronary artery (RCA): Calcified large vessel  30% proximal.  99% distal  Left ventriculography: Left ventricular systolic function is normal, LVEF is estimated at 55% , there is no significant mitral regurgitation  Mild inferior wall hypokinesis  Final Conclusions:   Culprit vessel is RCA  Stent with TS today  Recommendations: Stent to RCA  Charlton Haws 04/29/2011, 11:09 AM   Dr. Eden Emms asked me to review the films, and perform PCI of the RCA.  I have reviewed and discussed with the patient.  I also spoke with his sister Alexia Freestone who works in the lab, and his wife. I have reviewed the options with him, and we will plan to proceed.  He has been informed of the risks, as has his family.  Shawnie Pons 11:28 AM 04/29/2011

## 2011-04-29 NOTE — Brief Op Note (Signed)
See operative note Jesse [name] 04/29/2011

## 2011-04-30 ENCOUNTER — Other Ambulatory Visit: Payer: Self-pay

## 2011-04-30 DIAGNOSIS — E785 Hyperlipidemia, unspecified: Secondary | ICD-10-CM | POA: Diagnosis present

## 2011-04-30 DIAGNOSIS — I214 Non-ST elevation (NSTEMI) myocardial infarction: Principal | ICD-10-CM

## 2011-04-30 LAB — BASIC METABOLIC PANEL
BUN: 11 mg/dL (ref 6–23)
Chloride: 102 mEq/L (ref 96–112)
GFR calc Af Amer: 68 mL/min — ABNORMAL LOW (ref >90)
Potassium: 4 mEq/L (ref 3.5–5.1)
Sodium: 135 mEq/L (ref 135–145)

## 2011-04-30 LAB — CBC
HCT: 40.2 % (ref 39.0–52.0)
Platelets: 192 10*3/uL (ref 150–400)
RDW: 13.2 % (ref 11.5–15.5)
WBC: 8 10*3/uL (ref 4.0–10.5)

## 2011-04-30 MED ORDER — NITROGLYCERIN 0.4 MG SL SUBL: 0.4000 mg | SUBLINGUAL_TABLET | SUBLINGUAL | Status: DC | PRN

## 2011-04-30 MED ORDER — ASPIRIN 81 MG PO CHEW: 81.0000 mg | CHEWABLE_TABLET | Freq: Every day | ORAL | Status: DC

## 2011-04-30 MED ORDER — METOPROLOL TARTRATE 50 MG PO TABS: 50.0000 mg | ORAL_TABLET | Freq: Two times a day (BID) | ORAL | Status: DC

## 2011-04-30 MED ORDER — AMLODIPINE BESYLATE 5 MG PO TABS: 5.0000 mg | ORAL_TABLET | Freq: Every day | ORAL | Status: DC

## 2011-04-30 MED ORDER — TICAGRELOR 90 MG PO TABS: 90.0000 mg | ORAL_TABLET | Freq: Two times a day (BID) | ORAL | Status: DC

## 2011-04-30 MED ORDER — ATORVASTATIN CALCIUM 80 MG PO TABS: 80.0000 mg | ORAL_TABLET | Freq: Every day | ORAL | Status: DC

## 2011-04-30 MED ORDER — METOPROLOL TARTRATE 25 MG PO TABS: 50.0000 mg | ORAL_TABLET | Freq: Two times a day (BID) | ORAL | Status: DC

## 2011-04-30 MED FILL — Dextrose Inj 5%: INTRAVENOUS | Qty: 50 | Status: AC

## 2011-04-30 NOTE — Progress Notes (Signed)
   CARE MANAGEMENT NOTE 04/30/2011  Patient:  Jesse [name],Jesse [name]   Account Number:  [id number]  Date Initiated:  04/30/2011  Documentation initiated by:  GRAVES-BIGELOW,Deiondra Denley  Subjective/Objective Assessment:   Pt in with cp. S/p cath. Plan for home on brilinta. CM did fax brilinta card for free 30 day supply to [facility] in [place]. MD please write rx for free 30 day no refills and original with refills. If you can please escribe rx     Action/Plan:   to [facility] pharmacy or fax to [phone].   Anticipated DC Date:  04/30/2011   Anticipated DC Plan:  HOME/SELF CARE      DC Planning Services  CM consult      Choice offered to / List presented to:             Status of service:  Completed, signed off Medicare Important Message given?   (If response is "NO", the following Medicare IM given date fields will be blank) Date Medicare IM given:   Date Additional Medicare IM given:    Discharge Disposition:  HOME/SELF CARE  Per UR Regulation:    Comments:  04-30-11 [address] [place] [phone]. CM did call [facility] and the medication will be ordered today and in stock by 9am 05-01-11. Pt is aware and RN gave pt one med for tonight. Plan d/c today.

## 2011-04-30 NOTE — Progress Notes (Signed)
CARDIAC REHAB PHASE I   PRE:  Rate/Rhythm: 69SR  BP:  Supine: 144/85  Sitting:   Standing:    SaO2:   MODE:  Ambulation: 680 ft   POST:  Rate/Rhythem: 89SR  BP:  Supine:   Sitting: 162/78  Standing:    SaO2:  [id number] Pt walked without CP. Tolerated well. Gait steady. Education completed. Gave permission to refer to [facility] Phase 2.  Duanne Limerick

## 2011-04-30 NOTE — Progress Notes (Signed)
Cardiology Progress Note Patient Name: Jesse [name] Date of Encounter: 04/30/2011, 8:14 AM     Subjective  Patient doing well this morning. Ambulated with cardiac rehab. Denies chest pain or shortness of breath. 2.04sec pause noted on tele last night while patient sleeping.   Objective   Telemetry: Sinus rhythm 50s-70s, no arrythmias  Medications: . amLODipine  5 mg Oral Daily  . aspirin  81 mg Oral Daily  . metoprolol tartrate  25 mg Oral BID  . simvastatin  40 mg Oral q1800  . Ticagrelor  90 mg Oral BID   Physical Exam: Temp:  [97.8 F (36.6 C)-98.6 F (37 C)] 98.1 F (36.7 C) (12/18 0515) Pulse Rate:  [63-93] 67  (12/18 0515) Resp:  [18-20] 18  (12/17 1600) BP: (129-168)/(59-113) 130/75 mmHg (12/18 0515) SpO2:  [97 %-100 %] 99 % (12/18 0515) Weight:  [103.3 kg (227 lb 11.8 oz)] 227 lb 11.8 oz (103.3 kg) (12/18 0515)  General: Well developed, well nourished, overweight white male, in no acute distress. Head: Normocephalic, atraumatic, sclera non-icteric, nares are without discharge.  Neck: Supple. Negative for JVD Lungs: Clear bilaterally to auscultation without wheezes, rales, or rhonchi. Breathing is unlabored. Heart: RRR S1 S2 without murmurs, rubs, or gallops.  Abdomen: Soft, non-tender, non-distended with normoactive bowel sounds. No rebound/guarding. No obvious abdominal masses. Msk:  Strength and tone appear normal for age. Extremities: Right wrist with dry intact dressing. Hand warm and well perfused. No edema. No clubbing or cyanosis. Distal pedal pulses are 2+ and equal bilaterally. Neuro: Alert and oriented X 3. Moves all extremities spontaneously. Psych:  Responds to questions appropriately with a normal affect.  Intake/Output Summary (Last 24 hours) at 04/30/11 0814 Last data filed at 04/30/11 0500  Gross per 24 hour  Intake 1129.58 ml  Output   1900 ml  Net -770.42 ml    Labs:  [facility] 04/30/11 0355 04/29/11 0630  NA 135 140  K 4.0 3.7    CL 102 109  CO2 24 22  GLUCOSE 96 109*  BUN 11 12  CREATININE 1.25 1.35  CALCIUM 9.1 9.2   Basename 04/30/11 0355 04/29/11 0630  WBC 8.0 7.6  HGB 13.9 14.2  HCT 40.2 40.8  MCV 94.4 94.2  PLT 192 190   Basename 04/28/11 0500  CKTOTAL 257*  CKMB 7.9*  TROPONINI 1.33*   Basename 04/28/11 0500  CHOL 160  HDL 43  LDLCALC 95  TRIG 110  CHOLHDL 3.7    Radiology/Studies:  04/29/11 - Left Heart Cath, Selective Coronary Angiography, LV angiography Hemodynamics:  AO 125 72  LV 144 14  Coronary dominance: right  Left mainstem: Normal  Left anterior descending (LAD): diffuse disease in mid vessel. 60% multiple lesions with some bridging  IM- normal  Left circumflex (LCx): 30% multiple discrete  Right coronary artery (RCA): Calcified large vessel 30% proximal. 99% distal  Left ventriculography: Left ventricular systolic function is normal, LVEF is estimated at 55% , there is no significant mitral regurgitation Mild inferior wall hypokinesis  Final Conclusions: Culprit vessel is RCA   04/29/11 - PTCA and stenting of the RCA Lesion Data:  Vessel: RCA  Percent stenosis (pre): 99%  TIMI-flow (pre): 3  Stent: 3.5 by 28 Promus Element DES  Percent stenosis (post): 0%  TIMI-flow (post): 3  Small thrombus behind proximal stent at completion. Conclusions: Implantation of DES in the RCA for non STEMI  Disposition:  1. Aggressive medical therapy  2. DAPT  3. May benefit from exercise GXT with imaging to assess LAD in OP clinic      Assessment and Plan  65yom with PMHx significant for HTN and HL who presented to [facility] ED on 04/26/11 with DOE and chest discomfort and ruled in for NSTEMI. Underwent heart catheterization yesterday and is now s/p DES to RCA.  1. NSTEMI/CAD: Patient doing well without further chest pain or shortness of breath. Ambulated with cardiac rehab this morning without difficulty.  - Continue DAPT w/ ASA and Ticagrelor - Cont BB, statin, PRN nitro  2. HTN:  SBPs elevated in the 130s-160s  - Cont BB and Amlodipine - Consider adding back home ACEI  3. HLD: LDL 95 - Cont statin   Signed, HOPE, JESSICA PA-C  Patient seen and examined and history reviewed. Agree with above findings and plan. Patient's BP is still labile. I would increase metoprolol to 50 mg BID. Continue norvasc. Would hold ACEi until he is seen back in office and renal function reassessed. With LDL of 95 on pravastatin I would DC on Lipitor 80 mg daily. No driving for 2 weeks. Encouraged outpatient Rehab.  Thedora Hinders, [facility] 04/30/2011 9:56 AM

## 2011-04-30 NOTE — Discharge Summary (Signed)
Patient seen and examined and history reviewed. Agree with above findings and plan. See my prior rounding note from today.  Jesse [name] 04/30/2011 12:15 PM

## 2011-04-30 NOTE — Discharge Summary (Signed)
Discharge Summary   Patient ID: Jesse [name] MRN: [id number], DOB/AGE: April 22, 1946 65 y.o.  Primary MD: No primary provider on file. Primary Cardiologist: Swaziland, PETER MD  Admit date: 04/26/2011 D/C date:     04/30/2011      Primary Discharge Diagnoses:  1. NSTEMI   - Peak troponin 2.51  - Heart cath 04/29/11 s/p DES to RCA  - Cont DAPT w/ ASA and Brilinta  - Aggressive medical therapy with ASA, Brilinta, BB, Statin, PRN Nitro  Secondary Discharge Diagnoses:  1. HTN - ACEI held, initiated on Amlodipine and increased Metoprolol to 50mg  BID 2. HLD - LDL 95, switched from pravastatin to Lipitor 80mg   Allergies No Known Allergies  Diagnostic Studies/Procedures:  04/28/11 - 2D Echocardiogram - Normal LV size with mild LV hypertrophy. EF 55-60%. ? subtle hypokinesis in the anteroseptal wall. Doppler parameters are consistent with abnormal left ventricular relaxation (grade 1 diastolic dysfunction) Normal RV size and systolic function. No significant valvular abnormalities.  04/29/11 - Left Heart Cath, Selective Coronary Angiography, LV angiography Hemodynamics:  AO 125 72  LV 144 14  Coronary dominance: right  Left mainstem: Normal  Left anterior descending (LAD): diffuse disease in mid vessel. 60% multiple lesions with some bridging  IM- normal  Left circumflex (LCx): 30% multiple discrete  Right coronary artery (RCA): Calcified large vessel 30% proximal. 99% distal  Left ventriculography: Left ventricular systolic function is normal, LVEF is estimated at 55% , there is no significant mitral regurgitation Mild inferior wall hypokinesis  Final Conclusions: Culprit vessel is RCA   04/29/11 - PTCA and stenting of the RCA  Lesion Data:  Vessel: RCA  Percent stenosis (pre): 99%  TIMI-flow (pre): 3  Stent: 3.5 by 28 Promus Element DES  Percent stenosis (post): 0%  TIMI-flow (post): 3  Small thrombus behind proximal stent at completion.  Conclusions: Implantation of DES  in the RCA for non STEMI  Disposition:  1. Aggressive medical therapy  2. DAPT  3. May benefit from exercise GXT with imaging to assess LAD in OP clinic    History of Present Illness: 65yom with PMHx significant for HTN and HLD who presented to [facility] ED on 04/26/11 with DOE and chest discomfort and ruled in for NSTEMI.   He reported hanging Christmas lights on the day before admission when he became markedly short of breath. He would rest for a few minutes with resolution and continue this activity. These episodes continued to occur while exerting himself. He noted associated fatigue and substernal dull chest discomfort with radiation to his left arm described as a "tingling" sensation rated at a 1/10. Each episode lasted <30 min aggravated by exertion and alleviated with rest. These episodes have occurred intermittently for the last couple of weeks. On day of admission his discomfort and DOE were worse prompting him to present to the ED.   Hospital Course: In the ED his EKG revealed NSR with ST depression in V4-V6, I and aVL, CXR was without acute cardiopulmonary findings, POC TnI neg x 1, BMET and CBC WNL. He received full-dose ASA, NTG paste and IV which relieved his discomfort. BP on initial presentation was 180/114, but decreased to140/94 post-nitroglycerin administration. He was chest pain free and admitted for further evaluation and treatment.  His DDimer was mildly elevated and he ruled in for NSTEMI with peak troponin 2.51 over the weekend and was planned for cath on Monday morning. He had some chest pain with ambulation and was initiated on Brilinta. His ACEI  was held in preparation for cath and he was initiated on Amlodipine. He underwent heart catheterization on 04/29/11 with findings as noted above and is now s/p DES to RCA. He tolerated the procedure well without any complications and was able to ambulate with cardiac rehab without any complaints of shortness of breath or chest pain.   He  is being discharged in stable condition to home. He will continue dual antiplatelet therapy with ASA and Brilinta as well as aggressive medical therapy with BB, statin and BP control. His ACEI will continue to be held upon discharge with plans to recheck renal function at his follow up with Dr. Swaziland on 05/31/11. His metoprolol was increased to 50mg  BID for better BP control and his statin was changed to Lipitor 80mg  daily as his LDL was 95 on pravastatin at admission.   Discharge Vitals: Blood pressure 162/78, pulse 87, temperature 98.1 F (36.7 C), temperature source Oral, resp. rate 18, height 5\' 10"  (1.778 m), weight 103.3 kg (227 lb 11.8 oz), SpO2 96.00%.  Labs: Lab Results  Component Value Date   WBC 8.0 04/30/2011   HGB 13.9 04/30/2011   HCT 40.2 04/30/2011   MCV 94.4 04/30/2011   PLT 192 04/30/2011    Lab 04/30/11 0355  NA 135  K 4.0  CL 102  CO2 24  BUN 11  CREATININE 1.25  CALCIUM 9.1  GLUCOSE 96     04/26/2011 15:14 04/26/2011 17:56 04/26/2011 18:12 04/26/2011 23:50 04/28/2011 05:00  CK, MB  18.1 (HH) 17.8 (HH) 17.6 (HH) 7.9 (HH)  CK Total  639 (H) 658 (H) 526 (H) 257 (H)  Troponin I <0.30 1.33 (HH) 1.32 (HH) 2.51 (HH) 1.33 (HH)   Lab Results  Component Value Date   CHOL 160 04/28/2011   HDL 43 04/28/2011   LDLCALC 95 04/28/2011   TRIG 110 04/28/2011   Lab Results  Component Value Date   DDIMER 0.59* 04/26/2011    Discharge Medications   Current Discharge Medication List    START taking these medications   Details  amLODipine (NORVASC) 5 MG tablet Take 1 tablet (5 mg total) by mouth daily. Qty: 30 tablet, Refills: 6    aspirin 81 MG chewable tablet Chew 1 tablet (81 mg total) by mouth daily.    atorvastatin (LIPITOR) 80 MG tablet Take 1 tablet (80 mg total) by mouth daily. Qty: 30 tablet, Refills: 6    nitroGLYCERIN (NITROSTAT) 0.4 MG SL tablet Place 1 tablet (0.4 mg total) under the tongue every 5 (five) minutes as needed for chest pain. Qty: 25  tablet, Refills: 3    Ticagrelor (BRILINTA) 90 MG TABS tablet Take 1 tablet (90 mg total) by mouth 2 (two) times daily. Qty: 60 tablet, Refills: 6      CONTINUE these medications which have CHANGED   Details  metoprolol tartrate (LOPRESSOR) 25 MG tablet Take 2 tablets (50 mg total) by mouth 2 (two) times daily. Qty: 60 tablet, Refills: 6      STOP taking these medications     pravastatin (PRAVACHOL) 80 MG tablet      ramipril (ALTACE) 10 MG capsule      Disposition    Diet - low sodium heart healthy      Increase activity slowly      Discharge instructions      Comments:   **PLEASE REMEMBER TO BRING ALL OF YOUR MEDICATIONS TO EACH OF YOUR FOLLOW-UP OFFICE VISITS.  * KEEP WRIST CATHETERIZATION SITE CLEAN AND DRY. Call the  office for any signs of bleeding, pus, swelling, increased pain, or any other concerns. * NO HEAVY LIFTING (>10lbs) X 2 WEEKS. * NO SEXUAL ACTIVITY X 2 WEEKS. * NO DRIVING X 2 WEEKS. * NO SOAKING BATHS, HOT TUBS, POOLS, ETC., X 7 DAYS.   Follow-up Information    Follow up with [facility] on 05/31/2011. (11:45 Lab Work (BMET))    Contact information:   [address] [place] [address] [phone]      Follow up with Peter Swaziland, MD on 05/31/2011. (12:00)    Contact information:   [facility] [address] [place] [address] [phone]      Outstanding Labs/Studies: BMET at next clinic visit  Duration of Discharge Encounter: Greater than 30 minutes including physician and PA time.  Signed, Inara Dike PA-C 04/30/2011, 10:35 AM

## 2011-04-30 NOTE — Progress Notes (Signed)
2.04 sec pause noted on tele. Patient asleep at time and asymptomatic. Vitals stable and strip posted in chart.

## 2011-05-01 ENCOUNTER — Ambulatory Visit: Payer: Medicare Other | Admitting: Family Medicine

## 2011-05-09 NOTE — Telephone Encounter (Signed)
Hd spoken with wife earlier but left another message today.

## 2011-05-14 DIAGNOSIS — I219 Acute myocardial infarction, unspecified: Secondary | ICD-10-CM

## 2011-05-14 HISTORY — DX: Acute myocardial infarction, unspecified: I21.9

## 2011-05-16 ENCOUNTER — Encounter: Payer: Self-pay | Admitting: Nurse Practitioner

## 2011-05-17 ENCOUNTER — Ambulatory Visit (INDEPENDENT_AMBULATORY_CARE_PROVIDER_SITE_OTHER): Payer: Medicare Other | Admitting: Nurse Practitioner

## 2011-05-17 ENCOUNTER — Encounter: Payer: Self-pay | Admitting: Nurse Practitioner

## 2011-05-17 ENCOUNTER — Other Ambulatory Visit: Payer: Self-pay | Admitting: *Deleted

## 2011-05-17 VITALS — BP 132/78 | HR 72 | Ht 70.0 in | Wt 227.0 lb

## 2011-05-17 DIAGNOSIS — E785 Hyperlipidemia, unspecified: Secondary | ICD-10-CM

## 2011-05-17 DIAGNOSIS — E7800 Pure hypercholesterolemia, unspecified: Secondary | ICD-10-CM

## 2011-05-17 DIAGNOSIS — N289 Disorder of kidney and ureter, unspecified: Secondary | ICD-10-CM

## 2011-05-17 DIAGNOSIS — I2510 Atherosclerotic heart disease of native coronary artery without angina pectoris: Secondary | ICD-10-CM | POA: Insufficient documentation

## 2011-05-17 LAB — BASIC METABOLIC PANEL
BUN: 17 mg/dL (ref 6–23)
CO2: 27 mEq/L (ref 19–32)
Calcium: 9.1 mg/dL (ref 8.4–10.5)
Chloride: 105 mEq/L (ref 96–112)
Creatinine, Ser: 1.4 mg/dL (ref 0.4–1.5)
GFR: 55.31 mL/min — ABNORMAL LOW (ref >60.00)
Glucose, Bld: 116 mg/dL — ABNORMAL HIGH (ref 70–99)
Potassium: 4.2 mEq/L (ref 3.5–5.1)
Sodium: 140 mEq/L (ref 135–145)

## 2011-05-17 NOTE — Assessment & Plan Note (Signed)
Now on Lipitor. Will recheck fasting labs in one month.
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
## 2011-05-17 NOTE — Assessment & Plan Note (Addendum)
Has had recent NSTEMI with DES to the RCA. EF is normal. Has residual LAD disease. We will be rechecking a BMET today. Would consider ACE if renal function ok. I will see him back in a month with fasting labs. I will have Dr. Swaziland review regarding stress testing. For now, he is totally asymptomatic. To start cardiac rehab soon. Patient is agreeable to this plan and will call if any problems develop in the interim.   Have reviewed with Dr. Swaziland. We will plan on following him clinically for now.

## 2011-05-17 NOTE — Patient Instructions (Addendum)
We will need to see you back in about a month. We will check fasting labs at that visit. Will probably arrange for stress testing at that time. I will talk with Dr. Swaziland.  We are going to check some labs today to look at your kidney function  Continue with your current medicines.  Call the [facility] office at [phone] if you have any questions, problems or concerns.

## 2011-05-29 ENCOUNTER — Telehealth (HOSPITAL_COMMUNITY): Payer: Self-pay | Admitting: *Deleted

## 2011-05-30 ENCOUNTER — Ambulatory Visit (HOSPITAL_COMMUNITY): Payer: Self-pay

## 2011-05-31 ENCOUNTER — Other Ambulatory Visit: Payer: Medicare Other | Admitting: *Deleted

## 2011-05-31 ENCOUNTER — Encounter: Payer: Medicare Other | Admitting: Cardiology

## 2011-06-03 ENCOUNTER — Ambulatory Visit (INDEPENDENT_AMBULATORY_CARE_PROVIDER_SITE_OTHER): Payer: No Typology Code available for payment source | Admitting: Family Medicine

## 2011-06-03 ENCOUNTER — Encounter (HOSPITAL_COMMUNITY): Payer: Medicare Other

## 2011-06-03 ENCOUNTER — Encounter: Payer: Self-pay | Admitting: Family Medicine

## 2011-06-03 DIAGNOSIS — J209 Acute bronchitis, unspecified: Secondary | ICD-10-CM | POA: Diagnosis not present

## 2011-06-03 DIAGNOSIS — J208 Acute bronchitis due to other specified organisms: Secondary | ICD-10-CM

## 2011-06-03 NOTE — Progress Notes (Signed)
  Patient Name: Jesse [name] Date of Birth: [birth year] Age: 66 y.o. Medical Record Number: [id number] Gender: male Date of Encounter: 06/03/2011  History of Present Illness:  Jesse [name] is a 66 y.o. very pleasant male patient who presents with the following:  Bad congestion -- no sick or cold or flu recently. Had a heart attack over christmas. Had chest pain over christmas over christmas --- MI.  Had a stent in his heart, 99% in one place.  Now everyone in his family -- RSV in the hospital. 1 1/2 years. Now has made all sick.  Has upcoming cardiac rehab No fever, chills, sweats. Has been rattling in chest -- did smoke for about 10 years No myalgias or arthralgias  Past Medical History, Surgical History, Social History, Family History, Problem List, Medications, and Allergies have been reviewed and updated if relevant.  Review of Systems: ROS: GEN: Acute illness details above GI: Tolerating PO intake GU: maintaining adequate hydration and urination Pulm: No SOB Interactive and getting along well at home.  Otherwise, ROS is as per the HPI.   Physical Examination: Filed Vitals:   06/03/11 1054  BP: 124/64  Pulse: 68  Temp: 99.2 F (37.3 C)  TempSrc: Oral  Height: 5\' 10"  (1.778 m)  Weight: 225 lb 12 oz (102.4 kg)  SpO2: 95%    Body mass index is 32.39 kg/(m^2).   Gen: WDWN, NAD; A & O x3, cooperative. Pleasant.Globally Non-toxic HEENT: Normocephalic and atraumatic. Throat clear, w/o exudate, R TM clear, L TM - good landmarks, No fluid present. rhinnorhea.  MMM Frontal sinuses: NT Max sinuses: NT NECK: Anterior cervical  LAD is absent CV: RRR, No M/G/R, cap refill <2 sec PULM: Breathing comfortably in no respiratory distress. no wheezing, crackles, rhonchi EXT: No c/c/e PSYCH: Friendly, good eye contact MSK: Nml gait    Assessment and Plan: Viral bronchitis:  Reassured pt. Supportive care reviewed. OK to take plain guaifenesin. Discussed that I think any  further meds could potentially be harmful for him and not worth potential interaction. Nontoxic.

## 2011-06-05 ENCOUNTER — Encounter (HOSPITAL_COMMUNITY): Payer: Medicare Other

## 2011-06-06 ENCOUNTER — Encounter (HOSPITAL_COMMUNITY)
Admission: RE | Admit: 2011-06-06 | Discharge: 2011-06-06 | Disposition: A | Discharge status: Home / Self Care | Payer: Medicare Other | Source: Ambulatory Visit | Attending: Cardiology | Admitting: Cardiology

## 2011-06-06 ENCOUNTER — Encounter (HOSPITAL_COMMUNITY): Payer: Self-pay

## 2011-06-06 DIAGNOSIS — I214 Non-ST elevation (NSTEMI) myocardial infarction: Secondary | ICD-10-CM | POA: Insufficient documentation

## 2011-06-06 DIAGNOSIS — E785 Hyperlipidemia, unspecified: Secondary | ICD-10-CM | POA: Insufficient documentation

## 2011-06-06 DIAGNOSIS — Z5189 Encounter for other specified aftercare: Secondary | ICD-10-CM | POA: Insufficient documentation

## 2011-06-06 DIAGNOSIS — I2510 Atherosclerotic heart disease of native coronary artery without angina pectoris: Secondary | ICD-10-CM | POA: Insufficient documentation

## 2011-06-06 DIAGNOSIS — N289 Disorder of kidney and ureter, unspecified: Secondary | ICD-10-CM | POA: Insufficient documentation

## 2011-06-06 DIAGNOSIS — I10 Essential (primary) hypertension: Secondary | ICD-10-CM | POA: Insufficient documentation

## 2011-06-06 DIAGNOSIS — Z9861 Coronary angioplasty status: Secondary | ICD-10-CM | POA: Insufficient documentation

## 2011-06-07 ENCOUNTER — Encounter (HOSPITAL_COMMUNITY): Payer: Medicare Other

## 2011-06-10 ENCOUNTER — Encounter (HOSPITAL_COMMUNITY)
Admission: RE | Admit: 2011-06-10 | Discharge: 2011-06-10 | Disposition: A | Discharge status: Home / Self Care | Payer: Medicare Other | Source: Ambulatory Visit | Attending: Cardiology | Admitting: Cardiology

## 2011-06-10 DIAGNOSIS — E785 Hyperlipidemia, unspecified: Secondary | ICD-10-CM | POA: Diagnosis not present

## 2011-06-10 DIAGNOSIS — I214 Non-ST elevation (NSTEMI) myocardial infarction: Secondary | ICD-10-CM | POA: Diagnosis not present

## 2011-06-10 DIAGNOSIS — N289 Disorder of kidney and ureter, unspecified: Secondary | ICD-10-CM | POA: Diagnosis not present

## 2011-06-10 DIAGNOSIS — I10 Essential (primary) hypertension: Secondary | ICD-10-CM | POA: Diagnosis not present

## 2011-06-10 DIAGNOSIS — Z5189 Encounter for other specified aftercare: Secondary | ICD-10-CM | POA: Diagnosis not present

## 2011-06-10 DIAGNOSIS — I2510 Atherosclerotic heart disease of native coronary artery without angina pectoris: Secondary | ICD-10-CM | POA: Diagnosis not present

## 2011-06-10 DIAGNOSIS — Z9861 Coronary angioplasty status: Secondary | ICD-10-CM | POA: Diagnosis not present

## 2011-06-10 NOTE — Progress Notes (Signed)
Pt started cardiac rehab today.  Pt tolerated light exercise without difficulty.  Denies cp or dyspnea with exercise.  VSS.  Telemetry- NSR, rare PAC.  Pt oriented to exercise equipment and routine.  Understanding verbalized-jrion,rn

## 2011-06-12 ENCOUNTER — Encounter (HOSPITAL_COMMUNITY)
Admission: RE | Admit: 2011-06-12 | Discharge: 2011-06-12 | Disposition: A | Discharge status: Home / Self Care | Payer: Medicare Other | Source: Ambulatory Visit | Attending: Cardiology | Admitting: Cardiology

## 2011-06-14 ENCOUNTER — Encounter (HOSPITAL_COMMUNITY)
Admission: RE | Admit: 2011-06-14 | Discharge: 2011-06-14 | Disposition: A | Discharge status: Home / Self Care | Payer: Medicare Other | Source: Ambulatory Visit | Attending: Cardiology | Admitting: Cardiology

## 2011-06-14 ENCOUNTER — Encounter (HOSPITAL_COMMUNITY): Payer: Medicare Other

## 2011-06-14 DIAGNOSIS — Z9861 Coronary angioplasty status: Secondary | ICD-10-CM | POA: Insufficient documentation

## 2011-06-14 DIAGNOSIS — I2510 Atherosclerotic heart disease of native coronary artery without angina pectoris: Secondary | ICD-10-CM | POA: Insufficient documentation

## 2011-06-14 DIAGNOSIS — I214 Non-ST elevation (NSTEMI) myocardial infarction: Secondary | ICD-10-CM | POA: Insufficient documentation

## 2011-06-14 DIAGNOSIS — N289 Disorder of kidney and ureter, unspecified: Secondary | ICD-10-CM | POA: Insufficient documentation

## 2011-06-14 DIAGNOSIS — I10 Essential (primary) hypertension: Secondary | ICD-10-CM | POA: Diagnosis not present

## 2011-06-14 DIAGNOSIS — Z5189 Encounter for other specified aftercare: Secondary | ICD-10-CM | POA: Diagnosis not present

## 2011-06-14 DIAGNOSIS — E785 Hyperlipidemia, unspecified: Secondary | ICD-10-CM | POA: Insufficient documentation
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
## 2011-06-17 ENCOUNTER — Encounter (HOSPITAL_COMMUNITY)
Admission: RE | Admit: 2011-06-17 | Discharge: 2011-06-17 | Disposition: A | Discharge status: Home / Self Care | Payer: Medicare Other | Source: Ambulatory Visit | Attending: Cardiology | Admitting: Cardiology

## 2011-06-17 ENCOUNTER — Encounter: Payer: Self-pay | Admitting: Nurse Practitioner

## 2011-06-17 ENCOUNTER — Ambulatory Visit (INDEPENDENT_AMBULATORY_CARE_PROVIDER_SITE_OTHER): Payer: Medicare Other | Admitting: Nurse Practitioner

## 2011-06-17 ENCOUNTER — Other Ambulatory Visit (INDEPENDENT_AMBULATORY_CARE_PROVIDER_SITE_OTHER): Payer: No Typology Code available for payment source | Admitting: *Deleted

## 2011-06-17 ENCOUNTER — Encounter (HOSPITAL_COMMUNITY): Payer: Medicare Other

## 2011-06-17 VITALS — BP 118/70 | HR 62 | Ht 70.0 in | Wt 222.0 lb

## 2011-06-17 DIAGNOSIS — I2510 Atherosclerotic heart disease of native coronary artery without angina pectoris: Secondary | ICD-10-CM

## 2011-06-17 DIAGNOSIS — E785 Hyperlipidemia, unspecified: Secondary | ICD-10-CM

## 2011-06-17 DIAGNOSIS — I10 Essential (primary) hypertension: Secondary | ICD-10-CM

## 2011-06-17 LAB — BASIC METABOLIC PANEL
BUN: 18 mg/dL (ref 6–23)
CO2: 25 mEq/L (ref 19–32)
Calcium: 8.9 mg/dL (ref 8.4–10.5)
Chloride: 107 mEq/L (ref 96–112)
Creatinine, Ser: 1.4 mg/dL (ref 0.4–1.5)
GFR: 53.05 mL/min — ABNORMAL LOW (ref >60.00)
Glucose, Bld: 108 mg/dL — ABNORMAL HIGH (ref 70–99)
Potassium: 4.1 mEq/L (ref 3.5–5.1)
Sodium: 138 mEq/L (ref 135–145)

## 2011-06-17 LAB — LIPID PANEL
Cholesterol: 99 mg/dL (ref 0–200)
HDL: 40.8 mg/dL (ref >39.00)
LDL Cholesterol: 51 mg/dL (ref 0–99)
Total CHOL/HDL Ratio: 2
Triglycerides: 38 mg/dL (ref 0.0–149.0)
VLDL: 7.6 mg/dL (ref 0.0–40.0)

## 2011-06-17 LAB — HEPATIC FUNCTION PANEL
ALT: 32 U/L (ref 0–53)
AST: 34 U/L (ref 0–37)
Albumin: 3.8 g/dL (ref 3.5–5.2)
Alkaline Phosphatase: 69 U/L (ref 39–117)
Bilirubin, Direct: 0.1 mg/dL (ref 0.0–0.3)
Total Bilirubin: 0.6 mg/dL (ref 0.3–1.2)
Total Protein: 6.8 g/dL (ref 6.0–8.3)

## 2011-06-17 NOTE — Assessment & Plan Note (Addendum)
He is doing well. He is not having any symptoms. We are checking fasting labs today. We will continue with his current medicines. We will see him back in 3 months. Patient is agreeable to this plan and will call if any problems develop in the interim.

## 2011-06-17 NOTE — Patient Instructions (Signed)
You are doing well.  We will see you in 3 months.  Stay on your current medicines. Continue with your rehab.  Call the [facility] office at [phone] if you have any questions, problems or concerns.

## 2011-06-17 NOTE — Assessment & Plan Note (Signed)
Labs are checked today.  

## 2011-06-17 NOTE — Assessment & Plan Note (Signed)
Blood pressure looks good. No change in his current medicines.  
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
## 2011-06-19 ENCOUNTER — Encounter (HOSPITAL_COMMUNITY)
Admission: RE | Admit: 2011-06-19 | Discharge: 2011-06-19 | Disposition: A | Discharge status: Home / Self Care | Payer: Medicare Other | Source: Ambulatory Visit | Attending: Cardiology | Admitting: Cardiology

## 2011-06-19 ENCOUNTER — Encounter (HOSPITAL_COMMUNITY): Payer: Medicare Other

## 2011-06-19 ENCOUNTER — Other Ambulatory Visit: Payer: Self-pay

## 2011-06-19 DIAGNOSIS — E785 Hyperlipidemia, unspecified: Secondary | ICD-10-CM

## 2011-06-21 ENCOUNTER — Encounter (HOSPITAL_COMMUNITY): Payer: Medicare Other

## 2011-06-21 ENCOUNTER — Encounter (HOSPITAL_COMMUNITY)
Admission: RE | Admit: 2011-06-21 | Discharge: 2011-06-21 | Disposition: A | Discharge status: Home / Self Care | Payer: Medicare Other | Source: Ambulatory Visit | Attending: Cardiology | Admitting: Cardiology

## 2011-06-21 NOTE — Progress Notes (Signed)
Reviewed home exercise with pt today.  Pt plans to walk at home for exercise.  Reviewed THR, pulse, RPE, sign and symptoms, and when to call 911 or MD.  Pt voiced understanding. Electronically signed by Harriett Sine MS on Friday 06/21/2011 at 0912

## 2011-06-24 ENCOUNTER — Encounter (HOSPITAL_COMMUNITY)
Admission: RE | Admit: 2011-06-24 | Discharge: 2011-06-24 | Disposition: A | Discharge status: Home / Self Care | Payer: Medicare Other | Source: Ambulatory Visit | Attending: Cardiology | Admitting: Cardiology

## 2011-06-24 ENCOUNTER — Encounter (HOSPITAL_COMMUNITY): Payer: Medicare Other

## 2011-06-26 ENCOUNTER — Encounter (HOSPITAL_COMMUNITY): Payer: Medicare Other

## 2011-06-26 ENCOUNTER — Encounter (HOSPITAL_COMMUNITY)
Admission: RE | Admit: 2011-06-26 | Discharge: 2011-06-26 | Disposition: A | Discharge status: Home / Self Care | Payer: Medicare Other | Source: Ambulatory Visit | Attending: Cardiology | Admitting: Cardiology

## 2011-06-26 NOTE — Progress Notes (Signed)
Jesse [name] 66 y.o. male Nutrition Note  Spoke with pt.  Nutrition Plan reviewed with pt. Pt has not returned his Nutrition Survey.  Pt reports difficulty completing the survey. Pt encouraged to bring his survey in and we could complete the survey together. Pt wants to lose wt.  Pre-procedure UBW reportedly 237 lb in October 2012.  Pt states his wt has fluctuated between 200-240 lbs over the past 12 years "depending on if I want to get healthy or not." Pt has lost wt in the past by decreasing portion sizes and increasing activity. Pt current wt is down 13.3 lb over the past 4 months.  Rate of wt loss appears safe. Wt loss tips and maintaining weight loss discussed. Pt is aware that he is pre-diabetic. Pt reportedly pre-diabetic "for years." Pre-diabetes discussed.  Nutrition Diagnosis   Food-and nutrition-related knowledge deficit related to lack of exposure to information as related to diagnosis of: ? CVD ? Pre-DM (A1c 6.0)   Obesity related to excessive energy intake as evidenced by a BMI of 32.6  Nutrition RX/ Estimated Daily Nutrition Needs for: wt loss 1500-2000 Kcal , Total Fat 40-55gm, Saturated Fat 11-15 gm, Trans Fat 1.5-2.0 gm, Sodium less than 1500 mg Nutrition Intervention   Pt's individual nutrition plan including cholesterol goals reviewed with pt.   Benefits of adopting Therapeutic Lifestyle Changes discussed when Medficts reviewed.   Pt to attend the Portion Distortion class   Pt to attend the  ? Nutrition I class                          ? Nutrition II class    Pt given handouts for: ? wt loss ? pre-DM    Continue client-centered nutrition education by RD, as part of interdisciplinary care. Goal(s)   Pt to identify and limit food sources of saturated fat, trans fat, and cholesterol   Pt to identify food quantities necessary to achieve: ? wt loss to a goal wt of 200-212 lb (90.9-96.4 kg) at graduation from cardiac rehab.    Pt able to name foods that affect blood glucose    Monitor and Evaluate progress toward nutrition goal with team.

## 2011-06-28 ENCOUNTER — Encounter (HOSPITAL_COMMUNITY)
Admission: RE | Admit: 2011-06-28 | Discharge: 2011-06-28 | Disposition: A | Discharge status: Home / Self Care | Payer: Medicare Other | Source: Ambulatory Visit | Attending: Cardiology | Admitting: Cardiology

## 2011-06-28 ENCOUNTER — Encounter (HOSPITAL_COMMUNITY): Payer: Medicare Other

## 2011-06-28 NOTE — Progress Notes (Signed)
Jesse [name] 66 y.o. male Nutrition Note  Spoke with pt.  Nutrition Survey completed and reviewed with pt. Per discussion with pt, pt states he is aware that his doctor "thinks he is pre-diabetic," but the pt does not feel like he is pre-diabetic. Pre-diabetes discussed again.  Nutrition Diagnosis   Food-and nutrition-related knowledge deficit related to lack of exposure to information as related to diagnosis of: ? CVD ? Pre-DM (A1c 6.0)   Obesity related to excessive energy intake as evidenced by a BMI of 32.6  Nutrition RX/ Estimated Daily Nutrition Needs for: wt loss 1500-2000 Kcal , Total Fat 40-55gm, Saturated Fat 11-15 gm, Trans Fat 1.5-2.0 gm, Sodium less than 1500 mg Nutrition Intervention   Benefits of adopting Therapeutic Lifestyle Changes discussed when Medficts reviewed.   Pt to attend the Portion Distortion class   Pt to attend the  ? Nutrition I class                          ? Nutrition II class   Continue client-centered nutrition education by RD, as part of interdisciplinary care. Goal(s)   Pt to identify and limit food sources of saturated fat, trans fat, and cholesterol   Pt to identify food quantities necessary to achieve: ? wt loss to a goal wt of 200-212 lb (90.9-96.4 kg) at graduation from cardiac rehab.    Pt able to name foods that affect blood glucose  Monitor and Evaluate progress toward nutrition goal with team.

## 2011-07-01 ENCOUNTER — Encounter (HOSPITAL_COMMUNITY)
Admission: RE | Admit: 2011-07-01 | Discharge: 2011-07-01 | Disposition: A | Discharge status: Home / Self Care | Payer: Medicare Other | Source: Ambulatory Visit | Attending: Cardiology | Admitting: Cardiology

## 2011-07-01 ENCOUNTER — Encounter (HOSPITAL_COMMUNITY): Payer: Medicare Other

## 2011-07-03 ENCOUNTER — Encounter (HOSPITAL_COMMUNITY)
Admission: RE | Admit: 2011-07-03 | Discharge: 2011-07-03 | Disposition: A | Discharge status: Home / Self Care | Payer: Medicare Other | Source: Ambulatory Visit | Attending: Cardiology | Admitting: Cardiology

## 2011-07-03 ENCOUNTER — Encounter (HOSPITAL_COMMUNITY): Payer: Medicare Other

## 2011-07-05 ENCOUNTER — Encounter (HOSPITAL_COMMUNITY): Admission: RE | Admit: 2011-07-05 | Payer: Medicare Other | Source: Ambulatory Visit

## 2011-07-05 ENCOUNTER — Encounter (HOSPITAL_COMMUNITY): Payer: Medicare Other

## 2011-07-08 ENCOUNTER — Encounter (HOSPITAL_COMMUNITY): Payer: Medicare Other

## 2011-07-08 ENCOUNTER — Encounter (HOSPITAL_COMMUNITY)
Admission: RE | Admit: 2011-07-08 | Discharge: 2011-07-08 | Disposition: A | Discharge status: Home / Self Care | Payer: Medicare Other | Source: Ambulatory Visit | Attending: Cardiology | Admitting: Cardiology

## 2011-07-10 ENCOUNTER — Encounter (HOSPITAL_COMMUNITY)
Admission: RE | Admit: 2011-07-10 | Discharge: 2011-07-10 | Disposition: A | Discharge status: Home / Self Care | Payer: Medicare Other | Source: Ambulatory Visit | Attending: Cardiology | Admitting: Cardiology

## 2011-07-10 ENCOUNTER — Encounter (HOSPITAL_COMMUNITY): Payer: Medicare Other

## 2011-07-12 ENCOUNTER — Encounter (HOSPITAL_COMMUNITY): Payer: Medicare Other

## 2011-07-12 ENCOUNTER — Encounter: Payer: Self-pay | Admitting: Family Medicine

## 2011-07-12 ENCOUNTER — Ambulatory Visit: Payer: Self-pay | Admitting: Family Medicine

## 2011-07-12 ENCOUNTER — Ambulatory Visit (INDEPENDENT_AMBULATORY_CARE_PROVIDER_SITE_OTHER): Payer: Medicare Other | Admitting: Family Medicine

## 2011-07-12 VITALS — BP 140/80 | HR 75 | Temp 99.4°F | Ht 70.0 in | Wt 215.1 lb

## 2011-07-12 DIAGNOSIS — J40 Bronchitis, not specified as acute or chronic: Secondary | ICD-10-CM

## 2011-07-12 MED ORDER — GUAIFENESIN-CODEINE 100-10 MG/5ML PO SYRP: 5.0000 mL | ORAL_SOLUTION | Freq: Two times a day (BID) | ORAL | Status: AC | PRN

## 2011-07-12 MED ORDER — AZITHROMYCIN 250 MG PO TABS: ORAL_TABLET | ORAL | Status: AC

## 2011-07-12 NOTE — Assessment & Plan Note (Signed)
Anticipate viral bronchitis, discussed this. See pt instructions. Given comorbidities, provided with zpack in case not improving as expected.

## 2011-07-12 NOTE — Patient Instructions (Addendum)
Sounds like you have a upper respiratory infection, likely viral however. Antibiotics are not needed for this right now.  Viral infections usually take 7-10 days to resolve.  The cough can last several weeks to go away. Use medication as prescribed: cheratussin for cough at night.  Start flonase again. Use simple mucinex with plenty of fluid to break up mucous. May use tylenol as needed Push fluids and plenty of rest. Buy thermometer .  If fever >101.5, or worsening productive cough despite above, fill antibiotic (zpack provided) For viral conjunctivitis (eyes) - use warm compresses and over the counter lubricating eye drops as needed.

## 2011-07-12 NOTE — Progress Notes (Signed)
Addended by: Eustaquio Boyden on: 07/12/2011 01:26 PM   Modules accepted: Orders

## 2011-07-12 NOTE — Progress Notes (Signed)
  Subjective:    Patient ID: Jesse [name], male    DOB: [birth year], 66 y.o.   MRN: [id number]  HPI CC: cough  H/o CAD, HLD.  Thinks got cold from grandchildren.  One recently in hospital for RSV.  3d h/o cold sxs, started with ST and coughing.  Subjective fever.  Mild sinus pain.  Significant sinus congestion and chest congestion.  Some PNDrainage.    Chest wall pain from coughing.    Has tried tylenol.  Not using flonase.  Taking mucinex as well.  No fevers/chills, abd pain, SOB, chest tightness, HA.  No ear pain or tooth pain.  No smokers at home.  No h/o asthma, COPD  Review of Systems Per HPI    Objective:   Physical Exam  Nursing note and vitals reviewed. Constitutional: He appears well-developed and well-nourished. No distress.  HENT:  Head: Normocephalic and atraumatic.  Right Ear: Hearing, tympanic membrane, external ear and ear canal normal.  Left Ear: Hearing, tympanic membrane, external ear and ear canal normal.  Nose: Nose normal. No mucosal edema or rhinorrhea. Right sinus exhibits no maxillary sinus tenderness and no frontal sinus tenderness. Left sinus exhibits no maxillary sinus tenderness and no frontal sinus tenderness.  Mouth/Throat: Uvula is midline, oropharynx is clear and moist and mucous membranes are normal. No oropharyngeal exudate, posterior oropharyngeal edema, posterior oropharyngeal erythema or tonsillar abscesses.  Eyes: EOM are normal. Pupils are equal, round, and reactive to light. Right conjunctiva is injected. Left conjunctiva is injected. No scleral icterus. Right eye exhibits no nystagmus. Left eye exhibits no nystagmus.       Bilateral palpebral >bulbar conjunctivitis.  Limbic sparing present.  + yellow/clear discharge R>L eye.  ++ tearing of eyes.  Neck: Normal range of motion. Neck supple. No thyromegaly present.  Cardiovascular: Normal rate, regular rhythm, normal heart sounds and intact distal pulses.   No murmur heard. Pulmonary/Chest:  Effort normal and breath sounds normal. No respiratory distress. He has no wheezes. He has no rales.  Lymphadenopathy:    He has no cervical adenopathy.  Skin: Skin is warm and dry. No rash noted.       Assessment & Plan:

## 2011-07-15 ENCOUNTER — Encounter (HOSPITAL_COMMUNITY): Payer: Medicare Other

## 2011-07-17 ENCOUNTER — Encounter (HOSPITAL_COMMUNITY): Payer: Medicare Other

## 2011-07-19 ENCOUNTER — Encounter (HOSPITAL_COMMUNITY): Admission: RE | Admit: 2011-07-19 | Payer: Medicare Other | Source: Ambulatory Visit

## 2011-07-19 ENCOUNTER — Encounter (HOSPITAL_COMMUNITY): Payer: Medicare Other

## 2011-07-22 ENCOUNTER — Encounter (HOSPITAL_COMMUNITY): Payer: Medicare Other

## 2011-07-22 ENCOUNTER — Encounter (HOSPITAL_COMMUNITY)
Admission: RE | Admit: 2011-07-22 | Discharge: 2011-07-22 | Disposition: A | Discharge status: Home / Self Care | Payer: Medicare Other | Source: Ambulatory Visit | Attending: Cardiology | Admitting: Cardiology

## 2011-07-22 DIAGNOSIS — I2510 Atherosclerotic heart disease of native coronary artery without angina pectoris: Secondary | ICD-10-CM | POA: Insufficient documentation

## 2011-07-22 DIAGNOSIS — I214 Non-ST elevation (NSTEMI) myocardial infarction: Secondary | ICD-10-CM | POA: Diagnosis not present

## 2011-07-22 DIAGNOSIS — E785 Hyperlipidemia, unspecified: Secondary | ICD-10-CM | POA: Diagnosis not present

## 2011-07-22 DIAGNOSIS — Z5189 Encounter for other specified aftercare: Secondary | ICD-10-CM | POA: Diagnosis not present

## 2011-07-22 DIAGNOSIS — N289 Disorder of kidney and ureter, unspecified: Secondary | ICD-10-CM | POA: Diagnosis not present

## 2011-07-22 DIAGNOSIS — I10 Essential (primary) hypertension: Secondary | ICD-10-CM | POA: Diagnosis not present

## 2011-07-22 DIAGNOSIS — Z9861 Coronary angioplasty status: Secondary | ICD-10-CM | POA: Insufficient documentation

## 2011-07-24 ENCOUNTER — Encounter (HOSPITAL_COMMUNITY): Payer: Medicare Other

## 2011-07-26 ENCOUNTER — Encounter (HOSPITAL_COMMUNITY)
Admission: RE | Admit: 2011-07-26 | Discharge: 2011-07-26 | Disposition: A | Discharge status: Home / Self Care | Payer: Medicare Other | Source: Ambulatory Visit | Attending: Cardiology | Admitting: Cardiology

## 2011-07-26 ENCOUNTER — Encounter (HOSPITAL_COMMUNITY): Payer: Medicare Other

## 2011-07-29 ENCOUNTER — Encounter (HOSPITAL_COMMUNITY)
Admission: RE | Admit: 2011-07-29 | Discharge: 2011-07-29 | Disposition: A | Discharge status: Home / Self Care | Payer: Medicare Other | Source: Ambulatory Visit | Attending: Cardiology | Admitting: Cardiology

## 2011-07-29 ENCOUNTER — Encounter (HOSPITAL_COMMUNITY): Payer: Medicare Other

## 2011-07-31 ENCOUNTER — Encounter (HOSPITAL_COMMUNITY)
Admission: RE | Admit: 2011-07-31 | Discharge: 2011-07-31 | Disposition: A | Discharge status: Home / Self Care | Payer: Medicare Other | Source: Ambulatory Visit | Attending: Cardiology | Admitting: Cardiology

## 2011-07-31 ENCOUNTER — Encounter (HOSPITAL_COMMUNITY): Payer: Medicare Other

## 2011-08-02 ENCOUNTER — Encounter (HOSPITAL_COMMUNITY): Payer: Medicare Other

## 2011-08-02 ENCOUNTER — Encounter (HOSPITAL_COMMUNITY)
Admission: RE | Admit: 2011-08-02 | Discharge: 2011-08-02 | Disposition: A | Discharge status: Home / Self Care | Payer: Medicare Other | Source: Ambulatory Visit | Attending: Cardiology | Admitting: Cardiology

## 2011-08-05 ENCOUNTER — Encounter (HOSPITAL_COMMUNITY): Payer: Medicare Other

## 2011-08-05 ENCOUNTER — Encounter (HOSPITAL_COMMUNITY)
Admission: RE | Admit: 2011-08-05 | Discharge: 2011-08-05 | Disposition: A | Discharge status: Home / Self Care | Payer: Medicare Other | Source: Ambulatory Visit | Attending: Cardiology | Admitting: Cardiology

## 2011-08-05 ENCOUNTER — Telehealth: Payer: Self-pay | Admitting: Cardiology

## 2011-08-05 NOTE — Telephone Encounter (Signed)
Patient called, stating he received letter to make appointment.Advised last office visit 2/13 with Norma Fredrickson NP, advised 3 month follow up.Appointment scheduled with Norma Fredrickson NP 09/16/11 at 10:00 am.

## 2011-08-05 NOTE — Telephone Encounter (Signed)
New Msg: Pt calling wanting to speak with nurse. Please return pt call to discuss further.

## 2011-08-07 ENCOUNTER — Encounter (HOSPITAL_COMMUNITY)
Admission: RE | Admit: 2011-08-07 | Discharge: 2011-08-07 | Disposition: A | Discharge status: Home / Self Care | Payer: Medicare Other | Source: Ambulatory Visit | Attending: Cardiology | Admitting: Cardiology

## 2011-08-07 ENCOUNTER — Encounter (HOSPITAL_COMMUNITY): Payer: Medicare Other

## 2011-08-09 ENCOUNTER — Encounter (HOSPITAL_COMMUNITY)
Admission: RE | Admit: 2011-08-09 | Discharge: 2011-08-09 | Disposition: A | Discharge status: Home / Self Care | Payer: Medicare Other | Source: Ambulatory Visit | Attending: Cardiology | Admitting: Cardiology

## 2011-08-09 ENCOUNTER — Encounter (HOSPITAL_COMMUNITY): Payer: Medicare Other

## 2011-08-12 ENCOUNTER — Encounter (HOSPITAL_COMMUNITY)
Admission: RE | Admit: 2011-08-12 | Discharge: 2011-08-12 | Disposition: A | Discharge status: Home / Self Care | Payer: Medicare Other | Source: Ambulatory Visit | Attending: Cardiology | Admitting: Cardiology

## 2011-08-12 ENCOUNTER — Encounter (HOSPITAL_COMMUNITY): Payer: Medicare Other

## 2011-08-12 DIAGNOSIS — E785 Hyperlipidemia, unspecified: Secondary | ICD-10-CM | POA: Diagnosis not present

## 2011-08-12 DIAGNOSIS — I214 Non-ST elevation (NSTEMI) myocardial infarction: Secondary | ICD-10-CM | POA: Diagnosis not present

## 2011-08-12 DIAGNOSIS — N289 Disorder of kidney and ureter, unspecified: Secondary | ICD-10-CM | POA: Insufficient documentation

## 2011-08-12 DIAGNOSIS — Z9861 Coronary angioplasty status: Secondary | ICD-10-CM | POA: Diagnosis not present

## 2011-08-12 DIAGNOSIS — I2510 Atherosclerotic heart disease of native coronary artery without angina pectoris: Secondary | ICD-10-CM | POA: Insufficient documentation

## 2011-08-12 DIAGNOSIS — I10 Essential (primary) hypertension: Secondary | ICD-10-CM | POA: Insufficient documentation

## 2011-08-12 DIAGNOSIS — Z5189 Encounter for other specified aftercare: Secondary | ICD-10-CM | POA: Diagnosis not present

## 2011-08-14 ENCOUNTER — Encounter (HOSPITAL_COMMUNITY)
Admission: RE | Admit: 2011-08-14 | Discharge: 2011-08-14 | Disposition: A | Discharge status: Home / Self Care | Payer: Medicare Other | Source: Ambulatory Visit | Attending: Cardiology | Admitting: Cardiology

## 2011-08-14 ENCOUNTER — Encounter (HOSPITAL_COMMUNITY): Payer: Medicare Other

## 2011-08-16 ENCOUNTER — Encounter (HOSPITAL_COMMUNITY): Payer: Medicare Other

## 2011-08-19 ENCOUNTER — Encounter (HOSPITAL_COMMUNITY): Payer: Medicare Other

## 2011-08-21 ENCOUNTER — Encounter (HOSPITAL_COMMUNITY): Payer: Medicare Other

## 2011-08-23 ENCOUNTER — Encounter (HOSPITAL_COMMUNITY): Payer: Medicare Other

## 2011-08-26 ENCOUNTER — Encounter (HOSPITAL_COMMUNITY): Payer: Medicare Other

## 2011-08-26 ENCOUNTER — Encounter (HOSPITAL_COMMUNITY)
Admission: RE | Admit: 2011-08-26 | Discharge: 2011-08-26 | Disposition: A | Discharge status: Home / Self Care | Payer: Medicare Other | Source: Ambulatory Visit | Attending: Cardiology | Admitting: Cardiology

## 2011-08-28 ENCOUNTER — Encounter (HOSPITAL_COMMUNITY): Payer: Medicare Other

## 2011-08-28 ENCOUNTER — Encounter (HOSPITAL_COMMUNITY)
Admission: RE | Admit: 2011-08-28 | Discharge: 2011-08-28 | Disposition: A | Discharge status: Home / Self Care | Payer: Medicare Other | Source: Ambulatory Visit | Attending: Cardiology | Admitting: Cardiology

## 2011-08-30 ENCOUNTER — Encounter (HOSPITAL_COMMUNITY): Payer: Medicare Other

## 2011-08-30 ENCOUNTER — Encounter (HOSPITAL_COMMUNITY)
Admission: RE | Admit: 2011-08-30 | Discharge: 2011-08-30 | Disposition: A | Discharge status: Home / Self Care | Payer: Medicare Other | Source: Ambulatory Visit | Attending: Cardiology | Admitting: Cardiology

## 2011-09-02 ENCOUNTER — Encounter (HOSPITAL_COMMUNITY)
Admission: RE | Admit: 2011-09-02 | Discharge: 2011-09-02 | Disposition: A | Discharge status: Home / Self Care | Payer: Medicare Other | Source: Ambulatory Visit | Attending: Cardiology | Admitting: Cardiology

## 2011-09-02 ENCOUNTER — Encounter (HOSPITAL_COMMUNITY): Payer: Medicare Other

## 2011-09-04 ENCOUNTER — Encounter (HOSPITAL_COMMUNITY)
Admission: RE | Admit: 2011-09-04 | Discharge: 2011-09-04 | Disposition: A | Discharge status: Home / Self Care | Payer: Medicare Other | Source: Ambulatory Visit | Attending: Cardiology | Admitting: Cardiology

## 2011-09-04 ENCOUNTER — Encounter (HOSPITAL_COMMUNITY): Payer: Medicare Other

## 2011-09-06 ENCOUNTER — Encounter (HOSPITAL_COMMUNITY): Payer: Medicare Other

## 2011-09-09 ENCOUNTER — Encounter (HOSPITAL_COMMUNITY)
Admission: RE | Admit: 2011-09-09 | Discharge: 2011-09-09 | Disposition: A | Discharge status: Home / Self Care | Payer: Medicare Other | Source: Ambulatory Visit | Attending: Cardiology | Admitting: Cardiology

## 2011-09-11 ENCOUNTER — Encounter (HOSPITAL_COMMUNITY)
Admission: RE | Admit: 2011-09-11 | Discharge: 2011-09-11 | Disposition: A | Discharge status: Home / Self Care | Payer: Medicare Other | Source: Ambulatory Visit | Attending: Cardiology | Admitting: Cardiology

## 2011-09-11 DIAGNOSIS — I2510 Atherosclerotic heart disease of native coronary artery without angina pectoris: Secondary | ICD-10-CM | POA: Diagnosis not present

## 2011-09-11 DIAGNOSIS — I214 Non-ST elevation (NSTEMI) myocardial infarction: Secondary | ICD-10-CM | POA: Insufficient documentation

## 2011-09-11 DIAGNOSIS — E785 Hyperlipidemia, unspecified: Secondary | ICD-10-CM | POA: Insufficient documentation

## 2011-09-11 DIAGNOSIS — I10 Essential (primary) hypertension: Secondary | ICD-10-CM | POA: Insufficient documentation

## 2011-09-11 DIAGNOSIS — Z5189 Encounter for other specified aftercare: Secondary | ICD-10-CM | POA: Insufficient documentation

## 2011-09-11 DIAGNOSIS — Z9861 Coronary angioplasty status: Secondary | ICD-10-CM | POA: Diagnosis not present

## 2011-09-11 DIAGNOSIS — N289 Disorder of kidney and ureter, unspecified: Secondary | ICD-10-CM | POA: Insufficient documentation

## 2011-09-13 ENCOUNTER — Encounter (HOSPITAL_COMMUNITY)
Admission: RE | Admit: 2011-09-13 | Discharge: 2011-09-13 | Disposition: A | Discharge status: Home / Self Care | Payer: Medicare Other | Source: Ambulatory Visit | Attending: Cardiology | Admitting: Cardiology

## 2011-09-16 ENCOUNTER — Ambulatory Visit (INDEPENDENT_AMBULATORY_CARE_PROVIDER_SITE_OTHER): Payer: Medicare Other | Admitting: Nurse Practitioner

## 2011-09-16 ENCOUNTER — Encounter (HOSPITAL_COMMUNITY)
Admission: RE | Admit: 2011-09-16 | Discharge: 2011-09-16 | Disposition: A | Discharge status: Home / Self Care | Payer: Medicare Other | Source: Ambulatory Visit | Attending: Cardiology | Admitting: Cardiology

## 2011-09-16 ENCOUNTER — Encounter: Payer: Self-pay | Admitting: Nurse Practitioner

## 2011-09-16 VITALS — BP 110/70 | HR 56 | Ht 69.5 in | Wt 206.3 lb

## 2011-09-16 DIAGNOSIS — I2510 Atherosclerotic heart disease of native coronary artery without angina pectoris: Secondary | ICD-10-CM | POA: Diagnosis not present

## 2011-09-16 DIAGNOSIS — I10 Essential (primary) hypertension: Secondary | ICD-10-CM | POA: Diagnosis not present

## 2011-09-16 DIAGNOSIS — E785 Hyperlipidemia, unspecified: Secondary | ICD-10-CM | POA: Diagnosis not present

## 2011-09-16 NOTE — Assessment & Plan Note (Signed)
Rechecking labs in July

## 2011-09-16 NOTE — Patient Instructions (Addendum)
Stop the Norvasc (amlodipine)  Monitor your blood pressure at home. Goal is to stay less than 135/85.  Stay active and keep working on your weight  We will see you in 4 months.  Call the [facility] office at [phone] if you have any questions, problems or concerns.
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
## 2011-09-16 NOTE — Assessment & Plan Note (Signed)
He is about 5 months out from his MI/PCI. Does have residual disease. Doing very well clinically. No chest pain. Encouraged him to keep up with his exercise program and losing weight. We will see him back in about 4 months. Patient is agreeable to this plan and will call if any problems develop in the interim.

## 2011-09-16 NOTE — Assessment & Plan Note (Signed)
Blood pressure has been running lower. He has lost almost 20 pounds of weight since his MI. I have stopped his Norvasc. He will continue to monitor his blood pressure at home. Goal will be to be less than 135/85.

## 2011-09-18 ENCOUNTER — Encounter (HOSPITAL_COMMUNITY)
Admission: RE | Admit: 2011-09-18 | Discharge: 2011-09-18 | Disposition: A | Discharge status: Home / Self Care | Payer: Medicare Other | Source: Ambulatory Visit | Attending: Cardiology | Admitting: Cardiology

## 2011-09-20 ENCOUNTER — Encounter (HOSPITAL_COMMUNITY)
Admission: RE | Admit: 2011-09-20 | Discharge: 2011-09-20 | Disposition: A | Discharge status: Home / Self Care | Payer: Medicare Other | Source: Ambulatory Visit | Attending: Cardiology | Admitting: Cardiology

## 2011-09-23 ENCOUNTER — Encounter (HOSPITAL_COMMUNITY)
Admission: RE | Admit: 2011-09-23 | Discharge: 2011-09-23 | Disposition: A | Discharge status: Home / Self Care | Payer: Medicare Other | Source: Ambulatory Visit | Attending: Cardiology | Admitting: Cardiology

## 2011-09-23 ENCOUNTER — Encounter (HOSPITAL_COMMUNITY): Payer: Self-pay

## 2011-09-25 ENCOUNTER — Encounter (HOSPITAL_COMMUNITY): Payer: Medicare Other

## 2011-09-27 ENCOUNTER — Encounter (HOSPITAL_COMMUNITY): Payer: Medicare Other

## 2011-11-11 NOTE — Progress Notes (Signed)
Pt successfully completed cardiac rehab attending 35/36 exercise and 17 education sessions. Pt had excellent participation.   Pt VSS, Telemetry-NSR with 7.4kg weight loss.  Pt METS increased from 2.3 at baseline to 4.5 upon completion of program. Pt plans to exercise on his own.  Pt did not have hospital admission during cardiac rehab period.  Pt has made positive lifestyle changes and should be commended for his efforts.  Pt was a pleasure to work with.  Thank you for the referral.

## 2011-11-18 ENCOUNTER — Other Ambulatory Visit (INDEPENDENT_AMBULATORY_CARE_PROVIDER_SITE_OTHER): Payer: Medicare Other

## 2011-11-18 DIAGNOSIS — E785 Hyperlipidemia, unspecified: Secondary | ICD-10-CM | POA: Diagnosis not present

## 2011-11-18 LAB — LIPID PANEL
Cholesterol: 119 mg/dL (ref 0–200)
HDL: 56.1 mg/dL (ref >39.00)
LDL Cholesterol: 41 mg/dL (ref 0–99)
VLDL: 21.8 mg/dL (ref 0.0–40.0)

## 2011-11-18 LAB — BASIC METABOLIC PANEL
BUN: 17 mg/dL (ref 6–23)
Calcium: 9 mg/dL (ref 8.4–10.5)
GFR: 61.38 mL/min (ref >60.00)
Potassium: 4 mEq/L (ref 3.5–5.1)
Sodium: 139 mEq/L (ref 135–145)

## 2011-11-18 LAB — HEPATIC FUNCTION PANEL
AST: 39 U/L — ABNORMAL HIGH (ref 0–37)
Albumin: 4 g/dL (ref 3.5–5.2)
Total Bilirubin: 1.2 mg/dL (ref 0.3–1.2)

## 2011-11-22 ENCOUNTER — Other Ambulatory Visit: Payer: Self-pay | Admitting: *Deleted

## 2011-11-25 ENCOUNTER — Telehealth: Payer: Self-pay | Admitting: Cardiology

## 2011-11-25 NOTE — Telephone Encounter (Signed)
Patient called stated he wants to talk to Norma Fredrickson NP.States he did not want to tell me what this call is about.He has called before and he ends up talking to several people and he just wants to talk to [place].Message sent to [facility].

## 2011-11-25 NOTE — Telephone Encounter (Signed)
New problem:  Patient calling no information was given, wants Jesse [name] to give him a call.

## 2011-11-26 ENCOUNTER — Telehealth: Payer: Self-pay | Admitting: Cardiology

## 2011-11-26 MED ORDER — TICAGRELOR 90 MG PO TABS: 90.0000 mg | ORAL_TABLET | Freq: Two times a day (BID) | ORAL | Status: DC

## 2011-11-26 MED ORDER — METOPROLOL TARTRATE 50 MG PO TABS: 50.0000 mg | ORAL_TABLET | Freq: Two times a day (BID) | ORAL | Status: DC

## 2011-11-26 MED ORDER — ATORVASTATIN CALCIUM 80 MG PO TABS: 80.0000 mg | ORAL_TABLET | Freq: Every day | ORAL | Status: DC

## 2011-11-26 NOTE — Telephone Encounter (Signed)
Pt calling to talk to [facility], when asked if I could tell her what the call is re, he said no

## 2011-11-26 NOTE — Telephone Encounter (Signed)
Patient called no answer.LMTC. 

## 2011-11-26 NOTE — Telephone Encounter (Signed)
I called.  No answer.  Left message

## 2011-11-26 NOTE — Telephone Encounter (Signed)
Patient called stated he has had  trouble this year getting his medication refilled.Stated he needs metoprolol,brilanta,atorvastatin refills sent to [facility] in [place].Prescriptions sent to [facility].

## 2012-01-22 ENCOUNTER — Encounter: Payer: Self-pay | Admitting: Cardiology

## 2012-01-22 ENCOUNTER — Ambulatory Visit (INDEPENDENT_AMBULATORY_CARE_PROVIDER_SITE_OTHER): Payer: Medicare Other | Admitting: Cardiology

## 2012-01-22 VITALS — BP 152/100 | HR 61 | Ht 70.0 in | Wt 209.8 lb

## 2012-01-22 DIAGNOSIS — I2510 Atherosclerotic heart disease of native coronary artery without angina pectoris: Secondary | ICD-10-CM | POA: Diagnosis not present

## 2012-01-22 DIAGNOSIS — E785 Hyperlipidemia, unspecified: Secondary | ICD-10-CM | POA: Diagnosis not present

## 2012-01-22 DIAGNOSIS — I10 Essential (primary) hypertension: Secondary | ICD-10-CM

## 2012-01-22 MED ORDER — LOSARTAN POTASSIUM 50 MG PO TABS: 50.0000 mg | ORAL_TABLET | Freq: Every day | ORAL | Status: DC

## 2012-01-22 NOTE — Patient Instructions (Signed)
We will add Losartan 50 mg daily for your blood pressure.  Increase your aerobic exercise.  I will see you again in 6 months with fasting lab.
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
## 2012-04-17 DIAGNOSIS — Z23 Encounter for immunization: Secondary | ICD-10-CM | POA: Diagnosis not present

## 2012-07-02 ENCOUNTER — Telehealth: Payer: Self-pay | Admitting: Cardiology

## 2012-07-02 NOTE — Telephone Encounter (Signed)
Follow-up:    Patient's wife returned your call.  Please call back.

## 2012-07-02 NOTE — Telephone Encounter (Signed)
New Problem    Pt wife concerned about behavior. States he is acting strange/confused. Would like to speak to nurse. Has not contacted PCP.

## 2012-07-02 NOTE — Telephone Encounter (Signed)
Returned call to patient's wife not at home.LMTC.

## 2012-07-02 NOTE — Telephone Encounter (Signed)
Spoke to wife she stated she was worried about her husband.Stated he has been very forgetful and doing little odd things that is not like him.States he has had no chest pain ,no sob.Advised patient needs to see PCP.

## 2012-07-06 ENCOUNTER — Encounter: Payer: Self-pay | Admitting: Family Medicine

## 2012-07-06 ENCOUNTER — Ambulatory Visit (INDEPENDENT_AMBULATORY_CARE_PROVIDER_SITE_OTHER): Payer: Medicare Other | Admitting: Family Medicine

## 2012-07-06 VITALS — BP 140/84 | HR 68 | Temp 98.7°F | Ht 70.0 in | Wt 216.0 lb

## 2012-07-06 DIAGNOSIS — F329 Major depressive disorder, single episode, unspecified: Secondary | ICD-10-CM

## 2012-07-06 DIAGNOSIS — F919 Conduct disorder, unspecified: Secondary | ICD-10-CM | POA: Diagnosis not present

## 2012-07-06 DIAGNOSIS — I2510 Atherosclerotic heart disease of native coronary artery without angina pectoris: Secondary | ICD-10-CM

## 2012-07-06 DIAGNOSIS — R739 Hyperglycemia, unspecified: Secondary | ICD-10-CM

## 2012-07-06 DIAGNOSIS — R4689 Other symptoms and signs involving appearance and behavior: Secondary | ICD-10-CM

## 2012-07-06 DIAGNOSIS — I10 Essential (primary) hypertension: Secondary | ICD-10-CM | POA: Diagnosis not present

## 2012-07-06 DIAGNOSIS — R7309 Other abnormal glucose: Secondary | ICD-10-CM

## 2012-07-06 DIAGNOSIS — E785 Hyperlipidemia, unspecified: Secondary | ICD-10-CM

## 2012-07-06 DIAGNOSIS — R413 Other amnesia: Secondary | ICD-10-CM

## 2012-07-06 MED ORDER — FLUTICASONE PROPIONATE 50 MCG/ACT NA SUSP: 1.0000 | NASAL | Status: DC | PRN

## 2012-07-06 MED ORDER — NITROGLYCERIN 0.4 MG SL SUBL: 0.4000 mg | SUBLINGUAL_TABLET | SUBLINGUAL | Status: DC | PRN
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
## 2012-07-06 NOTE — Patient Instructions (Addendum)
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  Let me know if you want referral to hearing doctor. Try to cut back some on alcohol - it may be too heavy load on liver. I'd like you to return at your convenience fasting for blood work to check on things. Good to see you today, return in 6 months for follow up and physical as you're due (last one was 01/2011). Continue meds as up to now.

## 2012-07-07 ENCOUNTER — Encounter: Payer: Self-pay | Admitting: Family Medicine

## 2012-07-07 DIAGNOSIS — R4689 Other symptoms and signs involving appearance and behavior: Secondary | ICD-10-CM | POA: Insufficient documentation

## 2012-07-07 NOTE — Assessment & Plan Note (Signed)
Chronic, stable. Continue meds.  Slightly elevated today. 10lb weight gain noted.

## 2012-07-07 NOTE — Assessment & Plan Note (Signed)
Currently denies trouble with this.  PHQ9 = 2

## 2012-07-07 NOTE — Assessment & Plan Note (Signed)
Wife brings letter she wrote concerned about husband and several small details she has noticed recently not like him - asked to scan. Discussed with patient, he denies any depressed mood, any memory troubles, any SI/HI.  Denied fmhx dementia but sister says fmhx in grandmother. Discussed son's recent move - and how this can be difficult to adjust/cope. Advised he is the best person to know if any mood disorder is setting in - and if he feels like this is the case to please let me know or seek help. Stable partial mini mental status exam today, I do not see evidence of dementia today, but may be an issue. I recommended starting with blood work to check for reversible cause of mood change/behavior and memory changes that family endorses as well as monitoring chol, sugar, etc.

## 2012-07-07 NOTE — Assessment & Plan Note (Signed)
Due for f/u with cards - pt states he will call to schedule appointment with cards.

## 2012-08-04 ENCOUNTER — Other Ambulatory Visit (INDEPENDENT_AMBULATORY_CARE_PROVIDER_SITE_OTHER): Payer: Medicare Other

## 2012-08-04 DIAGNOSIS — I10 Essential (primary) hypertension: Secondary | ICD-10-CM

## 2012-08-04 DIAGNOSIS — R7309 Other abnormal glucose: Secondary | ICD-10-CM | POA: Diagnosis not present

## 2012-08-04 DIAGNOSIS — E785 Hyperlipidemia, unspecified: Secondary | ICD-10-CM

## 2012-08-04 DIAGNOSIS — R413 Other amnesia: Secondary | ICD-10-CM

## 2012-08-04 DIAGNOSIS — R739 Hyperglycemia, unspecified: Secondary | ICD-10-CM

## 2012-08-04 LAB — CBC WITH DIFFERENTIAL/PLATELET
Basophils Absolute: 0 10*3/uL (ref 0.0–0.1)
Basophils Relative: 0.6 % (ref 0.0–3.0)
Eosinophils Absolute: 0.2 10*3/uL (ref 0.0–0.7)
Lymphocytes Relative: 27.4 % (ref 12.0–46.0)
MCHC: 34 g/dL (ref 30.0–36.0)
MCV: 98.3 fl (ref 78.0–100.0)
Monocytes Absolute: 0.7 10*3/uL (ref 0.1–1.0)
Neutrophils Relative %: 59.9 % (ref 43.0–77.0)
RBC: 4.27 Mil/uL (ref 4.22–5.81)
RDW: 13.2 % (ref 11.5–14.6)

## 2012-08-04 LAB — COMPREHENSIVE METABOLIC PANEL
ALT: 43 U/L (ref 0–53)
AST: 37 U/L (ref 0–37)
Albumin: 4.1 g/dL (ref 3.5–5.2)
CO2: 28 mEq/L (ref 19–32)
Calcium: 9.3 mg/dL (ref 8.4–10.5)
Chloride: 104 mEq/L (ref 96–112)
Creatinine, Ser: 1.3 mg/dL (ref 0.4–1.5)
GFR: 58.54 mL/min — ABNORMAL LOW (ref >60.00)
Potassium: 4.3 mEq/L (ref 3.5–5.1)
Sodium: 138 mEq/L (ref 135–145)
Total Protein: 7.2 g/dL (ref 6.0–8.3)

## 2012-08-04 LAB — LIPID PANEL
LDL Cholesterol: 62 mg/dL (ref 0–99)
Total CHOL/HDL Ratio: 3
Triglycerides: 80 mg/dL (ref 0.0–149.0)

## 2012-08-04 LAB — FOLATE: Folate: 22.4 ng/mL (ref >5.9)

## 2012-08-04 LAB — VITAMIN B12: Vitamin B-12: 394 pg/mL (ref 211–911)

## 2012-08-14 ENCOUNTER — Telehealth: Payer: Self-pay | Admitting: Nurse Practitioner

## 2012-08-14 NOTE — Telephone Encounter (Signed)
Returned call to patient no answer.LMTC. 

## 2012-08-14 NOTE — Telephone Encounter (Signed)
Spoke to patient he stated he had recent lab work at [facility].Advised to keep appointment wit Dr.Jordan 09/04/12.

## 2012-08-14 NOTE — Telephone Encounter (Signed)
New problem   Pt has questions about appt with Dr [place] and lab appt for Monday that the stated he didn't need.Please call pt concerning this matter.

## 2012-08-17 ENCOUNTER — Other Ambulatory Visit: Payer: Self-pay

## 2012-09-04 ENCOUNTER — Ambulatory Visit: Payer: Self-pay | Admitting: Cardiology

## 2012-11-11 ENCOUNTER — Encounter: Payer: Self-pay | Admitting: Cardiology

## 2012-11-11 ENCOUNTER — Ambulatory Visit (INDEPENDENT_AMBULATORY_CARE_PROVIDER_SITE_OTHER): Payer: Medicare Other | Admitting: Cardiology

## 2012-11-11 VITALS — BP 150/102 | HR 62 | Ht 70.0 in | Wt 220.8 lb

## 2012-11-11 DIAGNOSIS — E785 Hyperlipidemia, unspecified: Secondary | ICD-10-CM | POA: Diagnosis not present

## 2012-11-11 DIAGNOSIS — E119 Type 2 diabetes mellitus without complications: Secondary | ICD-10-CM

## 2012-11-11 DIAGNOSIS — J40 Bronchitis, not specified as acute or chronic: Secondary | ICD-10-CM

## 2012-11-11 DIAGNOSIS — I2510 Atherosclerotic heart disease of native coronary artery without angina pectoris: Secondary | ICD-10-CM

## 2012-11-11 DIAGNOSIS — I10 Essential (primary) hypertension: Secondary | ICD-10-CM

## 2012-11-11 MED ORDER — METOPROLOL TARTRATE 50 MG PO TABS: 50.0000 mg | ORAL_TABLET | Freq: Two times a day (BID) | ORAL | Status: DC

## 2012-11-11 MED ORDER — LOSARTAN POTASSIUM-HCTZ 50-12.5 MG PO TABS: 1.0000 | ORAL_TABLET | Freq: Every day | ORAL | Status: DC

## 2012-11-11 MED ORDER — ATORVASTATIN CALCIUM 80 MG PO TABS: 80.0000 mg | ORAL_TABLET | Freq: Every day | ORAL | Status: DC

## 2012-11-11 MED ORDER — NITROGLYCERIN 0.4 MG SL SUBL: 0.4000 mg | SUBLINGUAL_TABLET | SUBLINGUAL | Status: DC | PRN

## 2012-11-11 NOTE — Patient Instructions (Addendum)
You need to follow a heart healthy diet and lose weight.  Increase you aerobic exercise  We will change losartan to losartan HCT 50/12.5 mg daily  Continue your other therapy.  I will see you in 6 months.
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
## 2013-05-26 ENCOUNTER — Encounter: Payer: Self-pay | Admitting: Cardiology

## 2013-06-11 ENCOUNTER — Ambulatory Visit: Payer: Self-pay | Admitting: Cardiology

## 2013-06-14 ENCOUNTER — Encounter: Payer: Self-pay | Admitting: Cardiology

## 2013-06-14 ENCOUNTER — Ambulatory Visit (INDEPENDENT_AMBULATORY_CARE_PROVIDER_SITE_OTHER): Payer: Medicare Other | Admitting: Cardiology

## 2013-06-14 VITALS — BP 140/92 | HR 66 | Ht 70.0 in | Wt 218.2 lb

## 2013-06-14 DIAGNOSIS — I10 Essential (primary) hypertension: Secondary | ICD-10-CM

## 2013-06-14 DIAGNOSIS — I2510 Atherosclerotic heart disease of native coronary artery without angina pectoris: Secondary | ICD-10-CM | POA: Diagnosis not present

## 2013-06-14 DIAGNOSIS — E119 Type 2 diabetes mellitus without complications: Secondary | ICD-10-CM

## 2013-06-14 MED ORDER — ATORVASTATIN CALCIUM 80 MG PO TABS: 80.0000 mg | ORAL_TABLET | Freq: Every day | ORAL | Status: DC

## 2013-06-14 MED ORDER — FLUTICASONE PROPIONATE 50 MCG/ACT NA SUSP: 1.0000 | NASAL | Status: DC | PRN

## 2013-06-14 MED ORDER — CARVEDILOL 25 MG PO TABS: 25.0000 mg | ORAL_TABLET | Freq: Two times a day (BID) | ORAL | Status: DC

## 2013-06-14 MED ORDER — LOSARTAN POTASSIUM-HCTZ 50-12.5 MG PO TABS
1.0000 | ORAL_TABLET | Freq: Every day | ORAL | Status: DC
Start: 2013-06-14 — End: 2013-06-25
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
## 2013-06-14 NOTE — Patient Instructions (Signed)
Stop metoprolol  Start carvedilol 25 mg twice a day  Continue your other therapy.  You need to increase your aerobic activity  Keep a diary of your blood pressure readings and bring your monitor with you next visit.  I will see you in 2 months and check fasting lab work.

## 2013-06-24 ENCOUNTER — Telehealth: Payer: Self-pay | Admitting: Cardiology

## 2013-06-24 NOTE — Telephone Encounter (Signed)
New message     Pt woke up this am with "heartburn".  B/p is 170/101.  Heartburn is better but he says he has not been comfortable all day---face is flushed.  Pt has had a cabg in the past

## 2013-06-24 NOTE — Telephone Encounter (Signed)
SEE NOTE

## 2013-06-24 NOTE — Telephone Encounter (Signed)
Follow up  ° ° °Patient returning call back to nurse  °

## 2013-06-24 NOTE — Telephone Encounter (Addendum)
Spoke with Dr.Nishan (DOD) and he recommended that the patient be added on to the DOD tomorrow and if he has another episode of "heartburn" he needs to proceed to the ER immediately. Called Mr.Capriotti back and he is aware of above recommendation. Will see Dr.McAlhany on 2/13 at 10 am.

## 2013-06-24 NOTE — Telephone Encounter (Addendum)
Spoke with patient who states that he had heartburn at 630 am took 1 NTG without relief. Took 5 tums with relief of heartburn. BP this am was 171/101. Drank a coke which gave him more heartburn, took a few more tums and then felt better. Currently BP 149/104. States he has not been drinking any alcohol in the past 2 days. When he had hid MI and stent his symptom was SOB. He is not complaining of any SOB. Advised will discuss with Dr.Nishan (DOD) and call him back.

## 2013-06-25 ENCOUNTER — Ambulatory Visit (INDEPENDENT_AMBULATORY_CARE_PROVIDER_SITE_OTHER): Payer: Medicare Other | Admitting: Cardiovascular Disease

## 2013-06-25 ENCOUNTER — Encounter: Payer: Self-pay | Admitting: Cardiovascular Disease

## 2013-06-25 VITALS — BP 133/85 | HR 68 | Ht 70.0 in | Wt 218.8 lb

## 2013-06-25 DIAGNOSIS — R079 Chest pain, unspecified: Secondary | ICD-10-CM | POA: Diagnosis not present

## 2013-06-25 DIAGNOSIS — E785 Hyperlipidemia, unspecified: Secondary | ICD-10-CM

## 2013-06-25 DIAGNOSIS — I10 Essential (primary) hypertension: Secondary | ICD-10-CM

## 2013-06-25 DIAGNOSIS — I2510 Atherosclerotic heart disease of native coronary artery without angina pectoris: Secondary | ICD-10-CM

## 2013-06-25 MED ORDER — LOSARTAN POTASSIUM-HCTZ 100-25 MG PO TABS: 1.0000 | ORAL_TABLET | Freq: Every day | ORAL | Status: DC

## 2013-06-25 NOTE — Patient Instructions (Addendum)
Keep scheduled appointment with Dr. [name] on April 6.  Your physician has requested that you have an exercise stress myoview. For further information please visit [email or web address]. Please follow instruction sheet, as given.  Your physician has recommended you make the following change in your medication Increase Hyzaar to 100/25 mg by mouth daily
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
## 2013-06-27 ENCOUNTER — Encounter: Payer: Self-pay | Admitting: Family Medicine

## 2013-06-30 ENCOUNTER — Ambulatory Visit (INDEPENDENT_AMBULATORY_CARE_PROVIDER_SITE_OTHER): Payer: Medicare Other | Admitting: Family Medicine

## 2013-06-30 ENCOUNTER — Encounter: Payer: Self-pay | Admitting: Family Medicine

## 2013-06-30 VITALS — BP 132/86 | HR 112 | Temp 99.1°F | Wt 213.0 lb

## 2013-06-30 DIAGNOSIS — I2510 Atherosclerotic heart disease of native coronary artery without angina pectoris: Secondary | ICD-10-CM | POA: Diagnosis not present

## 2013-06-30 DIAGNOSIS — R1013 Epigastric pain: Secondary | ICD-10-CM | POA: Insufficient documentation

## 2013-06-30 DIAGNOSIS — K3189 Other diseases of stomach and duodenum: Secondary | ICD-10-CM

## 2013-06-30 MED ORDER — OMEPRAZOLE 40 MG PO CPDR: 40.0000 mg | DELAYED_RELEASE_CAPSULE | Freq: Every day | ORAL | Status: DC

## 2013-06-30 NOTE — Assessment & Plan Note (Signed)
Describes dyspepsia - anticipate GERD or PUD related.  Today feeling better. Will start omeprazole 40mg  3wk course then use prn. Discussed use of zantac at night. Discussed if worsening would recommend he f/u with GI. Red flags to return discussed. Reviewed GERD diet and instructions.

## 2013-06-30 NOTE — Progress Notes (Signed)
   BP 132/86  Pulse 112  Temp(Src) 99.1 F (37.3 C) (Oral)  Wt 213 lb (96.616 kg)   CC: abd pain  Subjective:    Patient ID: Jesse [name], male    DOB: 04-21-1946, 68 y.o.   MRN: [id number]  HPI: Jesse [name] is a 68 y.o. male presenting on 06/30/2013 with Abdominal Pain  Presents with son today.  Recently seen by [facility] Dr. Angelena Form for burning chest pain thought not cardiac in etiology. Hyzaar was increased to 100/25, and coreg 25mg  bid was continued. Has not eaten anything in 2 days, did not take bp meds in last 2 days.  Over the last week noticing worsening heartburn/indigestion that started after eating hot dogs with spicy hot sauce.  Vomited x1 Monday night, NBNB.  Describes burning pain mid chest.  Burping up acid.  No EtOH in 1 week.  Usually drinks 3-4 beers 2-3 x/wk. Denies NSAID use. Chest pain did not improve with nitro. So far has tried tums which may have helped.  Pepto may have helped as well. No early satiety, no dysphagia.  No stool in last few days. Down 5 lbs in last week.  Relevant past medical, surgical, family and social history reviewed and updated. Allergies and medications reviewed and updated. Current Outpatient Prescriptions on File Prior to Visit  Medication Sig  . aspirin 81 MG chewable tablet Chew 1 tablet (81 mg total) by mouth daily.  Marland Kitchen atorvastatin (LIPITOR) 80 MG tablet Take 1 tablet (80 mg total) by mouth daily.  . carvedilol (COREG) 25 MG tablet Take 1 tablet (25 mg total) by mouth 2 (two) times daily.  . fluticasone (FLONASE) 50 MCG/ACT nasal spray Place 1 spray into both nostrils as needed for allergies.  Marland Kitchen losartan-hydrochlorothiazide (HYZAAR) 100-25 MG per tablet Take 1 tablet by mouth daily.  . nitroGLYCERIN (NITROSTAT) 0.4 MG SL tablet Place 1 tablet (0.4 mg total) under the tongue every 5 (five) minutes as needed for chest pain.   No current facility-administered medications on file prior to visit.    Review of Systems Per HPI  unless specifically indicated above    Objective:    BP 132/86  Pulse 112  Temp(Src) 99.1 F (37.3 C) (Oral)  Wt 213 lb (96.616 kg)  Physical Exam  Nursing note and vitals reviewed. Constitutional: He appears well-developed and well-nourished. No distress.  obese  HENT:  Mouth/Throat: Oropharynx is clear and moist. No oropharyngeal exudate.  Cardiovascular: Regular rhythm, normal heart sounds and intact distal pulses.  Tachycardia present.   No murmur heard. Pulmonary/Chest: Effort normal and breath sounds normal. No respiratory distress. He has no wheezes. He has no rales.  Abdominal: Soft. Bowel sounds are normal. He exhibits no distension and no mass. There is no tenderness. There is no rebound and no guarding.  Skin: Skin is warm and dry. No rash noted.  Psychiatric: He has a normal mood and affect.       Assessment & Plan:   Problem List Items Addressed This Visit   Dyspepsia - Primary     Describes dyspepsia - anticipate GERD or PUD related.  Today feeling better. Will start omeprazole 40mg  3wk course then use prn. Discussed use of zantac at night. Discussed if worsening would recommend he f/u with GI. Red flags to return discussed. Reviewed GERD diet and instructions.        Follow up plan: Return if symptoms worsen or fail to improve.

## 2013-06-30 NOTE — Patient Instructions (Addendum)
I think you have dyspepsia or inflammation of your stomach lining and possible ulcer. Treat with avoiding aggravating foods like spicy foods, citruses and tomatoes, alcohol, and caffeine. Also avoid ibuprofen/aleve.  Tylenol is ok. Take omeprazole 40mg  once daily (30 min before a meal) for next 3 weeks then as needed. May use zantac over the counter as needed as well. If symptoms persist or worsening, please let us know to refer you to stomach doctor for evaluation of stomach lining. I don't think this is the gallbladder.  Peptic Ulcer A peptic ulcer is a sore in the lining of in your esophagus (esophageal ulcer), stomach (gastric ulcer), or in the first part of your small intestine (duodenal ulcer). The ulcer causes erosion into the deeper tissue. CAUSES  Normally, the lining of the stomach and the small intestine protects itself from the acid that digests food. The protective lining can be damaged by:  An infection caused by a bacterium called Helicobacter pylori (H. pylori).  Regular use of nonsteroidal anti-inflammatory drugs (NSAIDs), such as ibuprofen or aspirin.  Smoking tobacco. Other risk factors include being older than 52, drinking alcohol excessively, and having a family history of ulcer disease.  SYMPTOMS   Burning pain or gnawing in the area between the chest and the belly button.  Heartburn.  Nausea and vomiting.  Bloating. The pain can be worse on an empty stomach and at night. If the ulcer results in bleeding, it can cause:  Black, tarry stools.  Vomiting of bright red blood.  Vomiting of coffee ground looking materials. DIAGNOSIS  A diagnosis is usually made based upon your history and an exam. Other tests and procedures may be performed to find the cause of the ulcer. Finding a cause will help determine the best treatment. Tests and procedures may include:  Blood tests, stool tests, or breath tests to check for the bacterium H. pylori.  An upper  gastrointestinal (GI) series of the esophagus, stomach, and small intestine.  An endoscopy to examine the esophagus, stomach, and small intestine.  A biopsy. TREATMENT  Treatment may include:  Eliminating the cause of the ulcer, such as smoking, NSAIDs, or alcohol.  Medicines to reduce the amount of acid in your digestive tract.  Antibiotic medicines if the ulcer is caused by the H. pylori bacterium.  An upper endoscopy to treat a bleeding ulcer.  Surgery if the bleeding is severe or if the ulcer created a hole somewhere in the digestive system. HOME CARE INSTRUCTIONS   Avoid tobacco, alcohol, and caffeine. Smoking can increase the acid in the stomach, and continued smoking will impair the healing of ulcers.  Avoid foods and drinks that seem to cause discomfort or aggravate your ulcer.  Only take medicines as directed by your caregiver. Do not substitute over-the-counter medicines for prescription medicines without talking to your caregiver.  Keep any follow-up appointments and tests as directed. SEEK MEDICAL CARE IF:   Your do not improve within 7 days of starting treatment.  You have ongoing indigestion or heartburn. SEEK IMMEDIATE MEDICAL CARE IF:   You have sudden, sharp, or persistent abdominal pain.  You have bloody or dark black, tarry stools.  You vomit blood or vomit that looks like coffee grounds.  You become light headed, weak, or feel faint.  You become sweaty or clammy. MAKE SURE YOU:   Understand these instructions.  Will watch your condition.  Will get help right away if you are not doing well or get worse. Document Released: 04/26/2000 Document  Revised: 01/22/2012 Document Reviewed: 11/27/2011 [facility] Patient Information 2014 [place], [place].

## 2013-06-30 NOTE — Progress Notes (Signed)
Pre-visit discussion using our clinic review tool. No additional management support is needed unless otherwise documented below in the visit note.  

## 2013-07-05 ENCOUNTER — Telehealth: Payer: Self-pay | Admitting: Cardiology

## 2013-07-05 NOTE — Telephone Encounter (Signed)
Follow Up:  Pt's sister is calling c/o pt having BP concerns. She states she believes her brother had a heart attack. Would like a call back soon.

## 2013-07-05 NOTE — Telephone Encounter (Signed)
F/u   Pt's wife calling back again.

## 2013-07-05 NOTE — Telephone Encounter (Signed)
Returned call to patient's wife she stated husband saw Dr.McAlhany 06/25/13.Stated Hyzaar increased to 100/25 mg daily.Stated his B/P has been low since increase averaging 80/40 pulse 70's.Stated patient dizzy.Stated she made him appointment tomorrow 07/06/13 with Dr.Jordan.Stated he already took all medication this morning. Advised to hold pm dose of carvedilol today.Advised to bring diary of B/P readings to appointment.

## 2013-07-05 NOTE — Telephone Encounter (Signed)
New Message  Pt wife called states that his BP med's were changed recently and his BP is really low(no readings) no energy, stomach and chest pains. Requests a call back for a same day appt

## 2013-07-06 ENCOUNTER — Ambulatory Visit (INDEPENDENT_AMBULATORY_CARE_PROVIDER_SITE_OTHER): Payer: Medicare Other | Admitting: Cardiology

## 2013-07-06 ENCOUNTER — Telehealth: Payer: Self-pay | Admitting: Nurse Practitioner

## 2013-07-06 ENCOUNTER — Encounter: Payer: Self-pay | Admitting: Cardiology

## 2013-07-06 VITALS — BP 128/70 | HR 81 | Ht 70.0 in | Wt 207.0 lb

## 2013-07-06 DIAGNOSIS — I10 Essential (primary) hypertension: Secondary | ICD-10-CM

## 2013-07-06 DIAGNOSIS — I2510 Atherosclerotic heart disease of native coronary artery without angina pectoris: Secondary | ICD-10-CM

## 2013-07-06 DIAGNOSIS — R1013 Epigastric pain: Secondary | ICD-10-CM | POA: Diagnosis not present

## 2013-07-06 DIAGNOSIS — E785 Hyperlipidemia, unspecified: Secondary | ICD-10-CM | POA: Diagnosis not present

## 2013-07-06 DIAGNOSIS — K3189 Other diseases of stomach and duodenum: Secondary | ICD-10-CM | POA: Diagnosis not present

## 2013-07-06 LAB — CBC WITH DIFFERENTIAL/PLATELET
BASOS ABS: 0.1 10*3/uL (ref 0.0–0.1)
Basophils Relative: 1 % (ref 0–1)
EOS PCT: 2 % (ref 0–5)
Eosinophils Absolute: 0.3 10*3/uL (ref 0.0–0.7)
HEMATOCRIT: 40 % (ref 39.0–52.0)
Hemoglobin: 14.4 g/dL (ref 13.0–17.0)
Lymphocytes Relative: 11 % — ABNORMAL LOW (ref 12–46)
Lymphs Abs: 1.4 10*3/uL (ref 0.7–4.0)
MCH: 34.9 pg — ABNORMAL HIGH (ref 26.0–34.0)
MCHC: 36 g/dL (ref 30.0–36.0)
MCV: 96.9 fL (ref 78.0–100.0)
Monocytes Absolute: 1.3 10*3/uL — ABNORMAL HIGH (ref 0.1–1.0)
Monocytes Relative: 10 % (ref 3–12)
NEUTROS ABS: 9.6 10*3/uL — AB (ref 1.7–7.7)
Neutrophils Relative %: 76 % (ref 43–77)
Platelets: 344 10*3/uL (ref 150–400)
RBC: 4.13 MIL/uL — ABNORMAL LOW (ref 4.22–5.81)
RDW: 13.2 % (ref 11.5–15.5)
WBC: 12.6 10*3/uL — ABNORMAL HIGH (ref 4.0–10.5)

## 2013-07-06 LAB — HEPATIC FUNCTION PANEL
ALT: 39 U/L (ref 0–53)
AST: 28 U/L (ref 0–37)
Albumin: 3.3 g/dL — ABNORMAL LOW (ref 3.5–5.2)
Alkaline Phosphatase: 76 U/L (ref 39–117)
BILIRUBIN DIRECT: 0.1 mg/dL (ref 0.0–0.3)
Indirect Bilirubin: 0.4 mg/dL (ref 0.2–1.2)
Total Bilirubin: 0.5 mg/dL (ref 0.2–1.2)
Total Protein: 6.8 g/dL (ref 6.0–8.3)

## 2013-07-06 LAB — TROPONIN I: TROPONIN I: 0.03 ng/mL (ref <0.06)

## 2013-07-06 LAB — BASIC METABOLIC PANEL
BUN: 47 mg/dL — ABNORMAL HIGH (ref 6–23)
CALCIUM: 8.9 mg/dL (ref 8.4–10.5)
CO2: 27 mEq/L (ref 19–32)
CREATININE: 2.95 mg/dL — AB (ref 0.50–1.35)
Chloride: 97 mEq/L (ref 96–112)
Glucose, Bld: 123 mg/dL — ABNORMAL HIGH (ref 70–99)
POTASSIUM: 3.5 meq/L (ref 3.5–5.3)
Sodium: 136 mEq/L (ref 135–145)

## 2013-07-06 LAB — AMYLASE: AMYLASE: 62 U/L (ref 0–105)

## 2013-07-06 NOTE — Patient Instructions (Signed)
Reduce the Hyzaar back to 50/12.5 mg daily   Continue coreg and omeprazole.  We will check lab work today.  Keep your appointment for your nuclear stress test

## 2013-07-06 NOTE — Telephone Encounter (Signed)
Lab Results  Component Value Date   CREATININE 2.95* 07/06/2013   BUN 47* 07/06/2013   NA 136 07/06/2013   K 3.5 07/06/2013   CL 97 07/06/2013   CO2 27 07/06/2013    Lab Results  Component Value Date   WBC 12.6* 07/06/2013   HGB 14.4 07/06/2013   HCT 40.0 07/06/2013   MCV 96.9 07/06/2013   PLT 344 07/06/2013   Lab Results  Component Value Date   ALT 39 07/06/2013   AST 28 07/06/2013   ALKPHOS 76 07/06/2013   BILITOT 0.5 07/06/2013   Lab Results  Component Value Date   TROPONINI 0.03 07/06/2013    I received a call from Jesse [name] labs re: Jesse [name].  He was seen in the office earlier today w/ complaints of malaise, n/v/anorexia/orthostasis.  Labs reveal acute renal failure with a BUN of 47 and Creat of 2.95.  He is on Hyzaar 100-25.  This dose was only recently increased.  I rec that he hold his hyzaar and drink plenty of PO H2O.  I advised that our office will be in touch to arrange for f/u labs later this week.  He verbalized understanding.
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
## 2013-07-07 ENCOUNTER — Other Ambulatory Visit: Payer: Self-pay

## 2013-07-07 DIAGNOSIS — I10 Essential (primary) hypertension: Secondary | ICD-10-CM
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
## 2013-07-07 NOTE — Telephone Encounter (Signed)
Patient called no answer.LMTC. 

## 2013-07-09 ENCOUNTER — Other Ambulatory Visit (INDEPENDENT_AMBULATORY_CARE_PROVIDER_SITE_OTHER): Payer: Medicare Other | Admitting: *Deleted

## 2013-07-09 DIAGNOSIS — I10 Essential (primary) hypertension: Secondary | ICD-10-CM | POA: Diagnosis not present

## 2013-07-09 LAB — BASIC METABOLIC PANEL
BUN: 19 mg/dL (ref 6–23)
CO2: 29 mEq/L (ref 19–32)
Calcium: 9 mg/dL (ref 8.4–10.5)
Chloride: 102 mEq/L (ref 96–112)
Creatinine, Ser: 1.8 mg/dL — ABNORMAL HIGH (ref 0.4–1.5)
GFR: 39.84 mL/min — AB (ref >60.00)
GLUCOSE: 120 mg/dL — AB (ref 70–99)
Potassium: 3.3 mEq/L — ABNORMAL LOW (ref 3.5–5.1)
Sodium: 138 mEq/L (ref 135–145)

## 2013-07-14 ENCOUNTER — Encounter: Payer: Self-pay | Admitting: Internal Medicine

## 2013-07-14 ENCOUNTER — Ambulatory Visit (HOSPITAL_COMMUNITY): Payer: Medicare Other | Attending: Internal Medicine | Admitting: Radiology

## 2013-07-14 VITALS — BP 169/80 | HR 91 | Ht 70.0 in | Wt 210.0 lb

## 2013-07-14 DIAGNOSIS — I2510 Atherosclerotic heart disease of native coronary artery without angina pectoris: Secondary | ICD-10-CM | POA: Diagnosis not present

## 2013-07-14 DIAGNOSIS — Z87891 Personal history of nicotine dependence: Secondary | ICD-10-CM | POA: Insufficient documentation

## 2013-07-14 DIAGNOSIS — R079 Chest pain, unspecified: Secondary | ICD-10-CM | POA: Diagnosis not present

## 2013-07-14 DIAGNOSIS — I252 Old myocardial infarction: Secondary | ICD-10-CM | POA: Diagnosis not present

## 2013-07-14 DIAGNOSIS — I10 Essential (primary) hypertension: Secondary | ICD-10-CM | POA: Insufficient documentation

## 2013-07-14 DIAGNOSIS — E785 Hyperlipidemia, unspecified: Secondary | ICD-10-CM | POA: Insufficient documentation

## 2013-07-14 MED ORDER — TECHNETIUM TC 99M SESTAMIBI GENERIC - CARDIOLITE
11.0000 | Freq: Once | INTRAVENOUS | Status: AC | PRN
  Administered 2013-07-14: 11 via INTRAVENOUS

## 2013-07-14 MED ORDER — TECHNETIUM TC 99M SESTAMIBI GENERIC - CARDIOLITE
33.0000 | Freq: Once | INTRAVENOUS | Status: AC | PRN
  Administered 2013-07-14: 33 via INTRAVENOUS

## 2013-07-14 NOTE — Progress Notes (Signed)
[name], [place] [address] [phone]    Cardiology Nuclear Med Study  Jesse [name] is a 68 y.o. male     MRN : [id number]     DOB: [birth year]  Procedure Date: 07/14/2013  Nuclear Med Background Indication for Stress Test:  Evaluation for Ischemia and Stent Patency History:  CAD, MI, Cath (residual LAD stenosis), Stent (RCA), Echo 2012 EF 55-60% Cardiac Risk Factors: History of Smoking, Hypertension and Lipids  Symptoms:  Chest Pain   Nuclear Pre-Procedure Caffeine/Decaff Intake:  None NPO After: 8:00pm   Lungs:  clear O2 Sat: 91% on room air. IV 0.9% NS with Angio Cath:  22g  IV Site: R Hand  IV Started by:  Crissie Figures, RN  Chest Size (in):  42 Cup Size: n/a  Height: 5\' 10"  (1.778 m)  Weight:  210 lb (95.255 kg)  BMI:  Body mass index is 30.13 kg/(m^2). Tech Comments:  n/a    Nuclear Med Study 1 or 2 day study: 1 day  Stress Test Type:  Stress  Reading MD: N/A  Order Authorizing Provider:  Lauree Chandler, MD  Resting Radionuclide: Technetium 72m Sestamibi  Resting Radionuclide Dose: 11.0 mCi   Stress Radionuclide:  Technetium 36m Sestamibi  Stress Radionuclide Dose: 33.0 mCi           Stress Protocol Rest HR:  94 Stress HR: 155  Rest BP: 169/80 Stress BP: 203/60  Exercise Time (min): 6:00 METS: 7.0           Dose of Adenosine (mg):  n/a Dose of Lexiscan: n/a mg  Dose of Atropine (mg): n/a Dose of Dobutamine: n/a mcg/kg/min (at max HR)  Stress Test Technologist: Glade Lloyd, BS-ES  Nuclear Technologist:  Charlton Amor, CNMT     Rest Procedure:  Myocardial perfusion imaging was performed at rest 45 minutes following the intravenous administration of Technetium 101m Sestamibi. Rest ECG: Normal sinus rhythm. Decreasing anterior R wave progression.  Stress Procedure:  The patient exercised on the treadmill utilizing the Bruce Protocol for 6:00 minutes. The patient stopped due to fatigue and  denied any chest pain.  Technetium 62m Sestamibi was injected at peak exercise and myocardial perfusion imaging was performed after a brief delay. Stress ECG: No significant change from baseline ECG  QPS Raw Data Images:  Patient motion noted; appropriate software correction applied. Stress Images:  Normal homogeneous uptake in all areas of the myocardium. Rest Images:  Normal homogeneous uptake in all areas of the myocardium. Subtraction (SDS):  No evidence of ischemia. Transient Ischemic Dilatation (Normal <1.22):  0.85 Lung/Heart Ratio (Normal <0.45):  0.32  Quantitative Gated Spect Images QGS EDV:  104 ml QGS ESV:  42 ml  Impression Exercise Capacity:  Fair exercise capacity. BP Response:  Normal blood pressure response. Clinical Symptoms:  Fatigue ECG Impression:  No significant ST segment change suggestive of ischemia. Comparison with Prior Nuclear Study: No images to compare  Overall Impression:  Normal stress nuclear study.  there is no definite scar or ischemia. This is a low risk scan.  LV Ejection Fraction: 59%.  LV Wall Motion:  Normal Wall Motion.  Dola Argyle, MD

## 2013-07-15 ENCOUNTER — Telehealth: Payer: Self-pay | Admitting: Cardiology

## 2013-07-15 NOTE — Telephone Encounter (Signed)
Follow up     Returned nurses call

## 2013-07-15 NOTE — Telephone Encounter (Signed)
New message ° ° °Patient returning call back to nurse.  °

## 2013-07-15 NOTE — Telephone Encounter (Signed)
Returned call to patient lab and myoview results given.Appointment scheduled with Dr.Jordan 09/10/13.

## 2013-08-16 ENCOUNTER — Ambulatory Visit: Payer: Self-pay | Admitting: Cardiology

## 2013-08-16 ENCOUNTER — Other Ambulatory Visit: Payer: Self-pay

## 2013-09-10 ENCOUNTER — Ambulatory Visit (INDEPENDENT_AMBULATORY_CARE_PROVIDER_SITE_OTHER): Payer: Medicare Other | Admitting: Cardiology

## 2013-09-10 ENCOUNTER — Encounter: Payer: Self-pay | Admitting: Cardiology

## 2013-09-10 VITALS — BP 150/80 | HR 60 | Ht 70.0 in | Wt 212.0 lb

## 2013-09-10 DIAGNOSIS — I10 Essential (primary) hypertension: Secondary | ICD-10-CM | POA: Diagnosis not present

## 2013-09-10 DIAGNOSIS — E785 Hyperlipidemia, unspecified: Secondary | ICD-10-CM | POA: Diagnosis not present

## 2013-09-10 DIAGNOSIS — E119 Type 2 diabetes mellitus without complications: Secondary | ICD-10-CM | POA: Diagnosis not present

## 2013-09-10 DIAGNOSIS — I2510 Atherosclerotic heart disease of native coronary artery without angina pectoris: Secondary | ICD-10-CM

## 2013-09-10 MED ORDER — AMLODIPINE BESYLATE 2.5 MG PO TABS: 2.5000 mg | ORAL_TABLET | Freq: Every day | ORAL | Status: DC

## 2013-09-10 NOTE — Patient Instructions (Signed)
Continue your current therapy  We will add amlodipine 2.5 mg daily for blood pressure.  I will follow up in 6 months with lab work
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
## 2013-09-21 ENCOUNTER — Telehealth: Payer: Self-pay | Admitting: *Deleted

## 2013-09-21 DIAGNOSIS — R7303 Prediabetes: Secondary | ICD-10-CM

## 2013-09-21 MED ORDER — GLUCOSE BLOOD VI STRP: ORAL_STRIP | Status: DC

## 2013-09-21 NOTE — Telephone Encounter (Signed)
Lab Results  Component Value Date   HGBA1C 6.0 08/04/2012    He has dx prediabetes.  Ok to send in but with test QD PRN sig, #30 RF 3, ICD9 code = 790.29

## 2013-09-21 NOTE — Telephone Encounter (Signed)
Received request for One Touch Ultra Test strips test 1-4 times daily from [facility]. I don't see these on med list or even see a diagnosis of diabetes for patient. Do you want to deny this?

## 2014-03-17 DIAGNOSIS — Z23 Encounter for immunization: Secondary | ICD-10-CM | POA: Diagnosis not present

## 2014-04-20 ENCOUNTER — Encounter (HOSPITAL_COMMUNITY): Payer: Self-pay | Admitting: Cardiovascular Disease

## 2014-05-27 ENCOUNTER — Encounter: Payer: Self-pay | Admitting: Cardiology

## 2014-05-27 ENCOUNTER — Ambulatory Visit (INDEPENDENT_AMBULATORY_CARE_PROVIDER_SITE_OTHER): Payer: Medicare Other | Admitting: Cardiology

## 2014-05-27 VITALS — BP 120/80 | HR 63 | Ht 70.0 in | Wt 223.6 lb

## 2014-05-27 DIAGNOSIS — I2510 Atherosclerotic heart disease of native coronary artery without angina pectoris: Secondary | ICD-10-CM

## 2014-05-27 DIAGNOSIS — E785 Hyperlipidemia, unspecified: Secondary | ICD-10-CM | POA: Diagnosis not present

## 2014-05-27 DIAGNOSIS — I10 Essential (primary) hypertension: Secondary | ICD-10-CM | POA: Diagnosis not present

## 2014-05-27 LAB — BASIC METABOLIC PANEL
BUN: 16 mg/dL (ref 6–23)
CHLORIDE: 101 meq/L (ref 96–112)
CO2: 27 mEq/L (ref 19–32)
Calcium: 9.6 mg/dL (ref 8.4–10.5)
Creat: 1.51 mg/dL — ABNORMAL HIGH (ref 0.50–1.35)
GLUCOSE: 103 mg/dL — AB (ref 70–99)
Potassium: 4.4 mEq/L (ref 3.5–5.3)
Sodium: 138 mEq/L (ref 135–145)

## 2014-05-27 LAB — LIPID PANEL
Cholesterol: 125 mg/dL (ref 0–200)
HDL: 42 mg/dL (ref >39)
LDL Cholesterol: 64 mg/dL (ref 0–99)
Total CHOL/HDL Ratio: 3 Ratio
Triglycerides: 93 mg/dL (ref <150)
VLDL: 19 mg/dL (ref 0–40)

## 2014-05-27 LAB — HEPATIC FUNCTION PANEL
ALK PHOS: 65 U/L (ref 39–117)
ALT: 52 U/L (ref 0–53)
AST: 39 U/L — ABNORMAL HIGH (ref 0–37)
Albumin: 4.1 g/dL (ref 3.5–5.2)
BILIRUBIN INDIRECT: 0.7 mg/dL (ref 0.2–1.2)
BILIRUBIN TOTAL: 0.9 mg/dL (ref 0.2–1.2)
Bilirubin, Direct: 0.2 mg/dL (ref 0.0–0.3)
Total Protein: 7.1 g/dL (ref 6.0–8.3)

## 2014-05-27 MED ORDER — CARVEDILOL 25 MG PO TABS: 25.0000 mg | ORAL_TABLET | Freq: Two times a day (BID) | ORAL | Status: DC

## 2014-05-27 MED ORDER — ATORVASTATIN CALCIUM 80 MG PO TABS: 80.0000 mg | ORAL_TABLET | Freq: Every day | ORAL | Status: DC

## 2014-05-27 MED ORDER — AMLODIPINE BESYLATE 2.5 MG PO TABS: 2.5000 mg | ORAL_TABLET | Freq: Every day | ORAL | Status: DC

## 2014-05-27 MED ORDER — NITROGLYCERIN 0.4 MG SL SUBL: 0.4000 mg | SUBLINGUAL_TABLET | SUBLINGUAL | Status: DC | PRN
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
## 2014-05-27 NOTE — Patient Instructions (Signed)
We will check lab work today  I will see you in one year

## 2014-06-16 ENCOUNTER — Other Ambulatory Visit: Payer: Self-pay

## 2014-06-16 ENCOUNTER — Telehealth: Payer: Self-pay | Admitting: Cardiology

## 2014-06-16 MED ORDER — CARVEDILOL 25 MG PO TABS: 25.0000 mg | ORAL_TABLET | Freq: Two times a day (BID) | ORAL | Status: DC

## 2014-06-16 NOTE — Telephone Encounter (Signed)
Pt need a new prescription for Carvedilol #180 and refills please. Please call this to [facility].

## 2014-09-08 ENCOUNTER — Other Ambulatory Visit: Payer: Self-pay | Admitting: Cardiology

## 2014-09-08 MED ORDER — CARVEDILOL 25 MG PO TABS: 25.0000 mg | ORAL_TABLET | Freq: Two times a day (BID) | ORAL | Status: DC

## 2014-09-08 MED ORDER — AMLODIPINE BESYLATE 2.5 MG PO TABS: 2.5000 mg | ORAL_TABLET | Freq: Every day | ORAL | Status: DC

## 2014-09-08 NOTE — Telephone Encounter (Signed)
Received call from patient he stated he needed 90 day refills on medications.Refills sent to pharmacy.

## 2014-09-08 NOTE — Telephone Encounter (Signed)
New Message    Patient is calling again about his medication. Please give patient a call back.

## 2015-01-06 ENCOUNTER — Telehealth: Payer: Self-pay | Admitting: Cardiology

## 2015-01-06 ENCOUNTER — Other Ambulatory Visit: Payer: Self-pay | Admitting: Cardiology

## 2015-01-06 MED ORDER — CARVEDILOL 25 MG PO TABS: 25.0000 mg | ORAL_TABLET | Freq: Two times a day (BID) | ORAL | Status: DC

## 2015-01-06 NOTE — Telephone Encounter (Signed)
Returned call to patient 90 day refill for coreg sent to pharmacy.

## 2015-01-06 NOTE — Telephone Encounter (Signed)
Please call 90 day supply with 3 refills of Cardvedilol 25 mg ---1 BID---patient has one day of medication left.  Let him know when this is complete so he can pick it up.

## 2015-03-27 ENCOUNTER — Telehealth: Payer: Self-pay | Admitting: Cardiology

## 2015-03-27 DIAGNOSIS — E785 Hyperlipidemia, unspecified: Secondary | ICD-10-CM

## 2015-03-27 DIAGNOSIS — Z79899 Other long term (current) drug therapy: Secondary | ICD-10-CM

## 2015-03-27 NOTE — Telephone Encounter (Signed)
Lab slips mailed to patient.

## 2015-03-27 NOTE — Telephone Encounter (Signed)
Jesse [name] is needing to have a lab order sent to him before his appt on 06/27/14 .Marland Kitchen Thanks

## 2015-03-27 NOTE — Telephone Encounter (Signed)
LM requesting callback to confirm mailing address.  Ordered hep, lipids, and BMET as repeat of last year's labwork.

## 2015-06-26 DIAGNOSIS — Z79899 Other long term (current) drug therapy: Secondary | ICD-10-CM | POA: Diagnosis not present

## 2015-06-26 DIAGNOSIS — E785 Hyperlipidemia, unspecified: Secondary | ICD-10-CM | POA: Diagnosis not present

## 2015-06-27 LAB — LIPID PANEL
Cholesterol: 122 mg/dL — ABNORMAL LOW (ref 125–200)
HDL: 42 mg/dL (ref >=40)
LDL Cholesterol: 63 mg/dL (ref <130)
Total CHOL/HDL Ratio: 2.9 Ratio (ref <=5.0)
Triglycerides: 84 mg/dL (ref <150)
VLDL: 17 mg/dL (ref <30)

## 2015-06-27 LAB — BASIC METABOLIC PANEL
BUN: 18 mg/dL (ref 7–25)
CHLORIDE: 103 mmol/L (ref 98–110)
CO2: 27 mmol/L (ref 20–31)
Calcium: 9.3 mg/dL (ref 8.6–10.3)
Creat: 1.71 mg/dL — ABNORMAL HIGH (ref 0.70–1.25)
GLUCOSE: 96 mg/dL (ref 65–99)
POTASSIUM: 3.9 mmol/L (ref 3.5–5.3)
SODIUM: 142 mmol/L (ref 135–146)

## 2015-06-27 LAB — HEPATIC FUNCTION PANEL
ALBUMIN: 3.7 g/dL (ref 3.6–5.1)
ALK PHOS: 69 U/L (ref 40–115)
ALT: 27 U/L (ref 9–46)
AST: 21 U/L (ref 10–35)
BILIRUBIN DIRECT: 0.2 mg/dL (ref <=0.2)
BILIRUBIN INDIRECT: 0.7 mg/dL (ref 0.2–1.2)
BILIRUBIN TOTAL: 0.9 mg/dL (ref 0.2–1.2)
TOTAL PROTEIN: 6.9 g/dL (ref 6.1–8.1)

## 2015-06-28 ENCOUNTER — Encounter: Payer: Self-pay | Admitting: Cardiology

## 2015-06-28 ENCOUNTER — Ambulatory Visit (INDEPENDENT_AMBULATORY_CARE_PROVIDER_SITE_OTHER): Payer: Medicare Other | Admitting: Cardiology

## 2015-06-28 VITALS — BP 140/88 | HR 81 | Ht 70.0 in | Wt 234.3 lb

## 2015-06-28 DIAGNOSIS — I2510 Atherosclerotic heart disease of native coronary artery without angina pectoris: Secondary | ICD-10-CM

## 2015-06-28 DIAGNOSIS — R1013 Epigastric pain: Secondary | ICD-10-CM | POA: Diagnosis not present

## 2015-06-28 DIAGNOSIS — E785 Hyperlipidemia, unspecified: Secondary | ICD-10-CM | POA: Diagnosis not present

## 2015-06-28 MED ORDER — ATORVASTATIN CALCIUM 80 MG PO TABS: 80.0000 mg | ORAL_TABLET | Freq: Every day | ORAL | Status: DC

## 2015-06-28 MED ORDER — AMLODIPINE BESYLATE 2.5 MG PO TABS: 2.5000 mg | ORAL_TABLET | Freq: Every day | ORAL | Status: DC

## 2015-06-28 MED ORDER — CARVEDILOL 25 MG PO TABS: 25.0000 mg | ORAL_TABLET | Freq: Two times a day (BID) | ORAL | Status: DC
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
## 2015-06-28 NOTE — Addendum Note (Signed)
Addended by: Kathyrn Lass on: 06/28/2015 12:12 PM   Modules accepted: Orders

## 2015-06-28 NOTE — Patient Instructions (Signed)
Continue your current therapy  You need to lose weight- cut out sodas, sweets and simple carbohydrates,  I will see you in one year.

## 2015-10-19 ENCOUNTER — Telehealth: Payer: Self-pay | Admitting: Family Medicine

## 2015-10-19 NOTE — Telephone Encounter (Signed)
Per [facility]. Pt last seen 06/2013. Offered to schedule CPE, pt refused. Pt stated he will call back to schedule something with Dr. Darnell Level

## 2015-12-18 ENCOUNTER — Ambulatory Visit (INDEPENDENT_AMBULATORY_CARE_PROVIDER_SITE_OTHER): Payer: Medicare Other | Admitting: Nurse Practitioner

## 2015-12-18 ENCOUNTER — Encounter: Payer: Self-pay | Admitting: Nurse Practitioner

## 2015-12-18 VITALS — HR 73 | Ht 70.0 in | Wt 233.0 lb

## 2015-12-18 DIAGNOSIS — I10 Essential (primary) hypertension: Secondary | ICD-10-CM

## 2015-12-18 DIAGNOSIS — E785 Hyperlipidemia, unspecified: Secondary | ICD-10-CM

## 2015-12-18 DIAGNOSIS — Z79899 Other long term (current) drug therapy: Secondary | ICD-10-CM

## 2015-12-18 DIAGNOSIS — I2510 Atherosclerotic heart disease of native coronary artery without angina pectoris: Secondary | ICD-10-CM

## 2015-12-18 MED ORDER — AMLODIPINE BESYLATE 5 MG PO TABS: 5.0000 mg | ORAL_TABLET | Freq: Every day | ORAL | 3 refills | Status: DC

## 2015-12-18 MED ORDER — CARVEDILOL 25 MG PO TABS: 12.5000 mg | ORAL_TABLET | Freq: Two times a day (BID) | ORAL | 3 refills | Status: DC

## 2015-12-18 NOTE — Patient Instructions (Addendum)
We will be checking the following labs today - NONE   Medication Instructions:    Continue with your current medicines. BUT  I am going to cut the Coreg back to 12.5 mg twice a day  I am increasing the Norvasc to 5 mg a day - take 2 of your 2.5 mg tablets to = this dose    Testing/Procedures To Be Arranged:  We will consider stress testing on return  Follow-Up:   See me in 3 weeks - we will see how you are doing and consider sending you for a stress test    Other Special Instructions:   Start limiting your salt  Try to start walking 5 to 10 minutes each day.    If you need a refill on your cardiac medications before your next appointment, please call your pharmacy.   Call the [place] office at [phone] if you have any questions, problems or concerns.
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
## 2016-01-10 ENCOUNTER — Ambulatory Visit (INDEPENDENT_AMBULATORY_CARE_PROVIDER_SITE_OTHER): Payer: Medicare Other | Admitting: Nurse Practitioner

## 2016-01-10 ENCOUNTER — Encounter: Payer: Self-pay | Admitting: Nurse Practitioner

## 2016-01-10 ENCOUNTER — Telehealth (HOSPITAL_COMMUNITY): Payer: Self-pay | Admitting: *Deleted

## 2016-01-10 VITALS — BP 140/86 | HR 68 | Ht 70.0 in | Wt 232.8 lb

## 2016-01-10 DIAGNOSIS — I10 Essential (primary) hypertension: Secondary | ICD-10-CM | POA: Diagnosis not present

## 2016-01-10 DIAGNOSIS — I2510 Atherosclerotic heart disease of native coronary artery without angina pectoris: Secondary | ICD-10-CM | POA: Diagnosis not present

## 2016-01-10 DIAGNOSIS — E785 Hyperlipidemia, unspecified: Secondary | ICD-10-CM | POA: Diagnosis not present

## 2016-01-10 MED ORDER — CARVEDILOL 12.5 MG PO TABS: 12.5000 mg | ORAL_TABLET | Freq: Two times a day (BID) | ORAL | 3 refills | Status: DC

## 2016-01-10 MED ORDER — AMLODIPINE BESYLATE 5 MG PO TABS: 5.0000 mg | ORAL_TABLET | Freq: Every day | ORAL | 3 refills | Status: DC

## 2016-01-10 NOTE — Patient Instructions (Addendum)
We will be checking the following labs today - NONE   Medication Instructions:    Continue with your current medicines. BUT  Change the Amlodipine to taking at night  I have sent in new prescriptions for the 5 mg tablet of Amlodipine and the 12.5 mg tablet of Coreg to your drugstore    Testing/Procedures To Be Arranged:  Stress Myoview  Follow-Up:   See me in 3 months    Other Special Instructions:   Continue to monitor your blood pressure at home - goal is around 135/85 most of the time  Try to stop the diet drinks    If you need a refill on your cardiac medications before your next appointment, please call your pharmacy.   Call the [place] office at [phone] if you have any questions, problems or concerns.
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
## 2016-01-10 NOTE — Telephone Encounter (Signed)
Attempted to call patient regarding upcoming nuclear appointment- no answer.  Jesse [name] Jacqueline  

## 2016-01-16 ENCOUNTER — Telehealth: Payer: Self-pay | Admitting: Nurse Practitioner

## 2016-01-16 ENCOUNTER — Ambulatory Visit (HOSPITAL_COMMUNITY): Payer: Medicare Other | Attending: Cardiovascular Disease

## 2016-01-16 DIAGNOSIS — R9439 Abnormal result of other cardiovascular function study: Secondary | ICD-10-CM | POA: Insufficient documentation

## 2016-01-16 DIAGNOSIS — I2510 Atherosclerotic heart disease of native coronary artery without angina pectoris: Secondary | ICD-10-CM

## 2016-01-16 DIAGNOSIS — I10 Essential (primary) hypertension: Secondary | ICD-10-CM | POA: Diagnosis not present

## 2016-01-16 DIAGNOSIS — R5383 Other fatigue: Secondary | ICD-10-CM | POA: Insufficient documentation

## 2016-01-16 DIAGNOSIS — E785 Hyperlipidemia, unspecified: Secondary | ICD-10-CM | POA: Diagnosis not present

## 2016-01-16 LAB — MYOCARDIAL PERFUSION IMAGING
Estimated workload: 7 METS
Exercise duration (min): 5 min
Exercise duration (sec): 15 s
LV dias vol: 109 mL (ref 62–150)
LV sys vol: 49 mL
MPHR: 150 {beats}/min
Peak HR: 142 {beats}/min
Percent HR: 94 %
RATE: 0.33
Rest HR: 78 {beats}/min
SDS: 2
SRS: 4
SSS: 6
TID: 0.85

## 2016-01-16 MED ORDER — TECHNETIUM TC 99M TETROFOSMIN IV KIT
10.3000 | PACK | Freq: Once | INTRAVENOUS | Status: AC | PRN
  Administered 2016-01-16: 10 via INTRAVENOUS
  Filled 2016-01-16: qty 10

## 2016-01-16 MED ORDER — TECHNETIUM TC 99M TETROFOSMIN IV KIT
32.7000 | PACK | Freq: Once | INTRAVENOUS | Status: AC | PRN
  Administered 2016-01-16: 32.7 via INTRAVENOUS
  Filled 2016-01-16: qty 33

## 2016-01-16 NOTE — Telephone Encounter (Signed)
Jesse [name] is returning a call about test results

## 2016-01-23 ENCOUNTER — Telehealth: Payer: Self-pay | Admitting: Nurse Practitioner

## 2016-01-23 ENCOUNTER — Encounter: Payer: Self-pay | Admitting: Nurse Practitioner

## 2016-01-23 ENCOUNTER — Other Ambulatory Visit: Payer: Self-pay | Admitting: *Deleted

## 2016-01-23 MED ORDER — AMLODIPINE BESYLATE 5 MG PO TABS: 5.0000 mg | ORAL_TABLET | Freq: Every day | ORAL | 3 refills | Status: DC

## 2016-01-23 NOTE — Telephone Encounter (Signed)
S/w  Pt sent in new script for Amlodipine ( 5 mg ) daily.  Pharmacy did not receive last script.

## 2016-01-23 NOTE — Telephone Encounter (Signed)
New message  Pt call requesting to speak with RN. Pt states one of his meds was not refilled. Pt did not disclose any information about med. Pt asked to speak with only Andee Poles or Cecille Rubin. Please call back to discuss

## 2016-03-06 ENCOUNTER — Telehealth: Payer: Self-pay | Admitting: *Deleted

## 2016-03-06 NOTE — Telephone Encounter (Signed)
S/W pt did not call in today. This was already taken care of.

## 2016-03-12 ENCOUNTER — Encounter: Payer: Self-pay | Admitting: Nurse Practitioner

## 2016-03-13 ENCOUNTER — Telehealth: Payer: Self-pay | Admitting: Family Medicine

## 2016-03-13 NOTE — Telephone Encounter (Signed)
Pt last seen 06/2013.   Need to schedule pt for 2017 AWV + labs with Lindell Noe and CPE with Dr. Danise Mina  LVM on home phone to call back.

## 2016-03-27 ENCOUNTER — Encounter: Payer: Self-pay | Admitting: Nurse Practitioner

## 2016-03-27 ENCOUNTER — Ambulatory Visit (INDEPENDENT_AMBULATORY_CARE_PROVIDER_SITE_OTHER): Payer: Medicare Other | Admitting: Nurse Practitioner

## 2016-03-27 VITALS — BP 118/70 | HR 72 | Ht 70.0 in | Wt 230.4 lb

## 2016-03-27 DIAGNOSIS — I2510 Atherosclerotic heart disease of native coronary artery without angina pectoris: Secondary | ICD-10-CM

## 2016-03-27 DIAGNOSIS — E7800 Pure hypercholesterolemia, unspecified: Secondary | ICD-10-CM

## 2016-03-27 LAB — HEPATIC FUNCTION PANEL
ALT: 34 U/L (ref 9–46)
AST: 36 U/L — ABNORMAL HIGH (ref 10–35)
Albumin: 4 g/dL (ref 3.6–5.1)
Alkaline Phosphatase: 54 U/L (ref 40–115)
Bilirubin, Direct: 0.2 mg/dL (ref <=0.2)
Indirect Bilirubin: 0.7 mg/dL (ref 0.2–1.2)
Total Bilirubin: 0.9 mg/dL (ref 0.2–1.2)
Total Protein: 6.7 g/dL (ref 6.1–8.1)

## 2016-03-27 LAB — LIPID PANEL
Cholesterol: 131 mg/dL (ref <200)
HDL: 50 mg/dL (ref >40)
LDL Cholesterol: 58 mg/dL (ref <100)
Total CHOL/HDL Ratio: 2.6 Ratio (ref <5.0)
Triglycerides: 114 mg/dL (ref <150)
VLDL: 23 mg/dL (ref <30)

## 2016-03-27 LAB — BASIC METABOLIC PANEL
BUN: 19 mg/dL (ref 7–25)
CO2: 25 mmol/L (ref 20–31)
Calcium: 9 mg/dL (ref 8.6–10.3)
Chloride: 104 mmol/L (ref 98–110)
Creat: 1.42 mg/dL — ABNORMAL HIGH (ref 0.70–1.18)
Glucose, Bld: 139 mg/dL — ABNORMAL HIGH (ref 65–99)
Potassium: 4 mmol/L (ref 3.5–5.3)
Sodium: 138 mmol/L (ref 135–146)
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
## 2016-03-27 NOTE — Patient Instructions (Addendum)
We will be checking the following labs today - BMET, Lipids and HPF   Medication Instructions:    Continue with your current medicines.     Testing/Procedures To Be Arranged:  N/A  Follow-Up:   See Dr. [name] in February    Other Special Instructions:   N/A    If you need a refill on your cardiac medications before your next appointment, please call your pharmacy.   Call the [place] office at [phone] if you have any questions, problems or concerns.

## 2016-04-01 ENCOUNTER — Encounter: Payer: Self-pay | Admitting: Family Medicine

## 2016-04-01 ENCOUNTER — Ambulatory Visit (INDEPENDENT_AMBULATORY_CARE_PROVIDER_SITE_OTHER): Payer: Medicare Other | Admitting: Family Medicine

## 2016-04-01 VITALS — BP 132/80 | HR 72 | Temp 98.4°F | Wt 230.2 lb

## 2016-04-01 DIAGNOSIS — I10 Essential (primary) hypertension: Secondary | ICD-10-CM

## 2016-04-01 DIAGNOSIS — R7303 Prediabetes: Secondary | ICD-10-CM | POA: Diagnosis not present

## 2016-04-01 DIAGNOSIS — E785 Hyperlipidemia, unspecified: Secondary | ICD-10-CM

## 2016-04-01 DIAGNOSIS — E559 Vitamin D deficiency, unspecified: Secondary | ICD-10-CM

## 2016-04-01 DIAGNOSIS — N289 Disorder of kidney and ureter, unspecified: Secondary | ICD-10-CM

## 2016-04-01 DIAGNOSIS — Z23 Encounter for immunization: Secondary | ICD-10-CM | POA: Diagnosis not present

## 2016-04-01 DIAGNOSIS — Z1211 Encounter for screening for malignant neoplasm of colon: Secondary | ICD-10-CM

## 2016-04-01 DIAGNOSIS — I2510 Atherosclerotic heart disease of native coronary artery without angina pectoris: Secondary | ICD-10-CM | POA: Diagnosis not present

## 2016-04-01 DIAGNOSIS — Z125 Encounter for screening for malignant neoplasm of prostate: Secondary | ICD-10-CM | POA: Diagnosis not present

## 2016-04-01 DIAGNOSIS — Z1159 Encounter for screening for other viral diseases: Secondary | ICD-10-CM

## 2016-04-01 LAB — CBC WITH DIFFERENTIAL/PLATELET
BASOS ABS: 0.1 10*3/uL (ref 0.0–0.1)
Basophils Relative: 0.7 % (ref 0.0–3.0)
Eosinophils Absolute: 0.1 10*3/uL (ref 0.0–0.7)
Eosinophils Relative: 1.6 % (ref 0.0–5.0)
HEMATOCRIT: 41.3 % (ref 39.0–52.0)
Hemoglobin: 14.2 g/dL (ref 13.0–17.0)
LYMPHS PCT: 15.4 % (ref 12.0–46.0)
Lymphs Abs: 1.3 10*3/uL (ref 0.7–4.0)
MCHC: 34.5 g/dL (ref 30.0–36.0)
MCV: 100.2 fl — AB (ref 78.0–100.0)
MONOS PCT: 7.6 % (ref 3.0–12.0)
Monocytes Absolute: 0.6 10*3/uL (ref 0.1–1.0)
Neutro Abs: 6.1 10*3/uL (ref 1.4–7.7)
Neutrophils Relative %: 74.7 % (ref 43.0–77.0)
Platelets: 245 10*3/uL (ref 150.0–400.0)
RBC: 4.12 Mil/uL — AB (ref 4.22–5.81)
RDW: 14.4 % (ref 11.5–15.5)
WBC: 8.2 10*3/uL (ref 4.0–10.5)

## 2016-04-01 LAB — VITAMIN D 25 HYDROXY (VIT D DEFICIENCY, FRACTURES): VITD: 19.33 ng/mL — ABNORMAL LOW (ref 30.00–100.00)

## 2016-04-01 LAB — PSA, MEDICARE: PSA: 1.19 ng/ml (ref 0.10–4.00)

## 2016-04-01 LAB — HEMOGLOBIN A1C: Hgb A1c MFr Bld: 5.9 % (ref 4.6–6.5)

## 2016-04-01 LAB — MICROALBUMIN / CREATININE URINE RATIO
CREATININE, U: 384.9 mg/dL
MICROALB UR: 27.4 mg/dL — AB (ref 0.0–1.9)
Microalb Creat Ratio: 7.1 mg/g (ref 0.0–30.0)

## 2016-04-01 NOTE — Assessment & Plan Note (Signed)
Appreciate cards care of patient.  

## 2016-04-01 NOTE — Addendum Note (Signed)
Addended by: Royann Shivers A on: 04/01/2016 11:21 AM   Modules accepted: Orders

## 2016-04-01 NOTE — Patient Instructions (Addendum)
prevnar today.  Return to see me in 1 year for next visit.  We will set you up for colonoscopy.  Prostate exam today.  Labs today.  Get flu shot at [facility] and let us know to update your chart.

## 2016-04-01 NOTE — Progress Notes (Signed)
BP 132/80   Pulse 72   Temp 98.4 F (36.9 C) (Oral)   Wt 230 lb 4 oz (104.4 kg)   BMI 33.04 kg/m    CC: f/u visit - requested by cardiology Subjective:    Patient ID: Jesse [name], male    DOB: [birth year], 70 y.o.   MRN: [id number]  HPI: Jesse [name] is a 70 y.o. male presenting on 04/01/2016 for Follow-up (from cards)   Last seen here 06/2013.  CAD - s/p DES stent to RCA 04/2011 followed by cardiology. Recent myoview this year looked ok. On aspirin and lipitor. Dizziness improved with lower coreg dose and taking meds at night time. Not fasting at recent labs.   Preventative: Colon cancer screening - discussed, would like colonoscopy - referred Prostate cancer screening - discussed, would like screening at this time Lung cancer screening - quit remotely (1980)  Flu shot - will get at  Td ~2000 prevnar - today. Pneumovax 2012 Shingles shot - not covered Seat belt use discussed.  Sunscreen use discussed, no changing moles on skin. H/o malignant melanoma 1990.   Relevant past medical, surgical, family and social history reviewed and updated as indicated. Interim medical history since our last visit reviewed. Allergies and medications reviewed and updated. Current Outpatient Prescriptions on File Prior to Visit  Medication Sig  . amLODipine (NORVASC) 5 MG tablet Take 1 tablet (5 mg total) by mouth daily.  Marland Kitchen aspirin 81 MG chewable tablet Chew 1 tablet (81 mg total) by mouth daily.  Marland Kitchen atorvastatin (LIPITOR) 80 MG tablet Take 1 tablet (80 mg total) by mouth daily.  . carvedilol (COREG) 12.5 MG tablet Take 1 tablet (12.5 mg total) by mouth 2 (two) times daily.  Marland Kitchen glucose blood test strip Use to test sugar daily AS NEEDED (Patient not taking: Reported on 04/01/2016)  . nitroGLYCERIN (NITROSTAT) 0.4 MG SL tablet Place 1 tablet (0.4 mg total) under the tongue every 5 (five) minutes as needed for chest pain. (Patient not taking: Reported on 04/01/2016)   No current  facility-administered medications on file prior to visit.     Review of Systems Per HPI unless specifically indicated in ROS section     Objective:    BP 132/80   Pulse 72   Temp 98.4 F (36.9 C) (Oral)   Wt 230 lb 4 oz (104.4 kg)   BMI 33.04 kg/m   Wt Readings from Last 3 Encounters:  04/01/16 230 lb 4 oz (104.4 kg)  03/27/16 230 lb 6.4 oz (104.5 kg)  01/10/16 232 lb 12.8 oz (105.6 kg)    Physical Exam  Constitutional: He appears well-developed and well-nourished. No distress.  HENT:  Mouth/Throat: Oropharynx is clear and moist. No oropharyngeal exudate.  Eyes: Conjunctivae are normal. Pupils are equal, round, and reactive to light.  Neck: No thyromegaly present.  Cardiovascular: Normal rate, regular rhythm, normal heart sounds and intact distal pulses.   No murmur heard. Pulmonary/Chest: Effort normal and breath sounds normal. No respiratory distress. He has no wheezes. He has no rales.  Genitourinary: Rectum normal and prostate normal. Rectal exam shows no external hemorrhoid, no internal hemorrhoid, no fissure, no mass, no tenderness and anal tone normal. Prostate is not enlarged (15gm) and not tender.  Musculoskeletal: He exhibits no edema.  Skin: Skin is warm and dry. No rash noted.  Psychiatric: He has a normal mood and affect.  Nursing note and vitals reviewed.  Results for orders placed or performed in visit on 03/27/16  Basic metabolic panel  Result Value Ref Range   Sodium 138 135 - 146 mmol/L   Potassium 4.0 3.5 - 5.3 mmol/L   Chloride 104 98 - 110 mmol/L   CO2 25 20 - 31 mmol/L   Glucose, Bld 139 (H) 65 - 99 mg/dL   BUN 19 7 - 25 mg/dL   Creat 1.42 (H) 0.70 - 1.18 mg/dL   Calcium 9.0 8.6 - 10.3 mg/dL  Hepatic function panel  Result Value Ref Range   Total Bilirubin 0.9 0.2 - 1.2 mg/dL   Bilirubin, Direct 0.2 <=0.2 mg/dL   Indirect Bilirubin 0.7 0.2 - 1.2 mg/dL   Alkaline Phosphatase 54 40 - 115 U/L   AST 36 (H) 10 - 35 U/L   ALT 34 9 - 46 U/L    Total Protein 6.7 6.1 - 8.1 g/dL   Albumin 4.0 3.6 - 5.1 g/dL  Lipid panel  Result Value Ref Range   Cholesterol 131 <200 mg/dL   Triglycerides 114 <150 mg/dL   HDL 50 >40 mg/dL   Total CHOL/HDL Ratio 2.6 <5.0 Ratio   VLDL 23 <30 mg/dL   LDL Cholesterol 58 <100 mg/dL      Assessment & Plan:   Problem List Items Addressed This Visit    CAD (coronary artery disease)    Appreciate cards care of patient.      Essential hypertension    Chronic, stable. Continue current regimen.      Relevant Orders   Microalbumin / creatinine urine ratio   Hyperlipidemia - Primary    Chronic, stable. Continue high dose lipitor nightly.      Prediabetes    Update A1c      Relevant Orders   Hemoglobin A1c   Renal insufficiency    Cr peaked at 2.95 due to hyzaar. Seems stable at this time. Check CBC/vit D.       Relevant Orders   CBC with Differential/Platelet   VITAMIN D 25 Hydroxy (Vit-D Deficiency, Fractures)   Vitamin D deficiency   Relevant Orders   VITAMIN D 25 Hydroxy (Vit-D Deficiency, Fractures)    Other Visit Diagnoses    Special screening for malignant neoplasms, colon       Relevant Orders   Ambulatory referral to Gastroenterology   Special screening for malignant neoplasm of prostate       Relevant Orders   PSA, Medicare   Need for hepatitis C screening test       Relevant Orders   Hepatitis C antibody       Follow up plan: Return in about 1 year (around 04/01/2017) for follow up visit.  Jesse Bush, MD

## 2016-04-01 NOTE — Assessment & Plan Note (Signed)
Cr peaked at 2.95 due to hyzaar. Seems stable at this time. Check CBC/vit D.

## 2016-04-01 NOTE — Assessment & Plan Note (Signed)
Chronic, stable. Continue current regimen. 

## 2016-04-01 NOTE — Assessment & Plan Note (Signed)
Chronic, stable. Continue high dose lipitor nightly.

## 2016-04-01 NOTE — Progress Notes (Signed)
Pre visit review using our clinic review tool, if applicable. No additional management support is needed unless otherwise documented below in the visit note. 

## 2016-04-01 NOTE — Assessment & Plan Note (Signed)
Update A1c ?

## 2016-04-02 LAB — HEPATITIS C ANTIBODY: HCV AB: NEGATIVE

## 2016-04-03 ENCOUNTER — Ambulatory Visit (INDEPENDENT_AMBULATORY_CARE_PROVIDER_SITE_OTHER): Payer: Medicare Other

## 2016-04-03 VITALS — BP 140/80 | HR 74 | Temp 98.7°F | Ht 69.0 in | Wt 230.8 lb

## 2016-04-03 DIAGNOSIS — Z23 Encounter for immunization: Secondary | ICD-10-CM

## 2016-04-03 DIAGNOSIS — Z0000 Encounter for general adult medical examination without abnormal findings: Secondary | ICD-10-CM | POA: Diagnosis not present

## 2016-04-03 NOTE — Progress Notes (Signed)
Pre visit review using our clinic review tool, if applicable. No additional management support is needed unless otherwise documented below in the visit note. 

## 2016-04-03 NOTE — Progress Notes (Signed)
PCP notes:   Health maintenance:  Foot exam - if necessary, pt will complete at next PCP appt Eye exam - pt will schedule future appt Tetanus - pt will contact insurance Colon cancer screening - appt is being scheduled Flu vaccine - administered  Abnormal screenings:   None  Patient concerns:   None  Nurse concerns:  None  Next PCP appt:   N/A; pt recently had OV30 with PCP

## 2016-04-03 NOTE — Patient Instructions (Signed)
Jesse [name] , Thank you for taking time to come for your Medicare Wellness Visit. I appreciate your ongoing commitment to your health goals. Please review the following plan we discussed and let me know if I can assist you in the future.   These are the goals we discussed: Goals    . Increase physical activity          Starting 04/03/2016, I will continue to walk at least 30 min 5 days per week.        This is a list of the screening recommended for you and due dates:  Health Maintenance  Topic Date Due  . Complete foot exam   04/03/2017*  . Eye exam for diabetics  04/03/2017*  . DTaP/Tdap/Td vaccine (1 - Tdap) 05/13/2018*  . Tetanus Vaccine  05/13/2018*  . Colon Cancer Screening  04/03/2026*  . Hemoglobin A1C  09/29/2016  . Urine Protein Check  04/01/2017  . Flu Shot  Completed  . Shingles Vaccine  Addressed  .  Hepatitis C: One time screening is recommended by Center for Disease Control  (CDC) for  adults born from [birth year] through 1965.   Completed  . Pneumonia vaccines  Completed  *Topic was postponed. The date shown is not the original due date.   Preventive Care for Adults  A healthy lifestyle and preventive care can promote health and wellness. Preventive health guidelines for adults include the following key practices.  . A routine yearly physical is a good way to check with your health care provider about your health and preventive screening. It is a chance to share any concerns and updates on your health and to receive a thorough exam.  . Visit your dentist for a routine exam and preventive care every 6 months. Brush your teeth twice a day and floss once a day. Good oral hygiene prevents tooth decay and gum disease.  . The frequency of eye exams is based on your age, health, family medical history, use  of contact lenses, and other factors. Follow your health care provider's ecommendations for frequency of eye exams.  . Eat a healthy diet. Foods like vegetables, fruits,  whole grains, low-fat dairy products, and lean protein foods contain the nutrients you need without too many calories. Decrease your intake of foods high in solid fats, added sugars, and salt. Eat the right amount of calories for you. Get information about a proper diet from your health care provider, if necessary.  . Regular physical exercise is one of the most important things you can do for your health. Most adults should get at least 150 minutes of moderate-intensity exercise (any activity that increases your heart rate and causes you to sweat) each week. In addition, most adults need muscle-strengthening exercises on 2 or more days a week.  Silver Sneakers may be a benefit available to you. To determine eligibility, you may visit the website: www.silversneakers.com or contact program at 320-226-9365 Mon-Fri between 8AM-8PM.   . Maintain a healthy weight. The body mass index (BMI) is a screening tool to identify possible weight problems. It provides an estimate of body fat based on height and weight. Your health care provider can find your BMI and can help you achieve or maintain a healthy weight.   For adults 20 years and older: ? A BMI below 18.5 is considered underweight. ? A BMI of 18.5 to 24.9 is normal. ? A BMI of 25 to 29.9 is considered overweight. ? A BMI of 30 and above is  considered obese.   . Maintain normal blood lipids and cholesterol levels by exercising and minimizing your intake of saturated fat. Eat a balanced diet with plenty of fruit and vegetables. Blood tests for lipids and cholesterol should begin at age 51 and be repeated every 5 years. If your lipid or cholesterol levels are high, you are over 50, or you are at high risk for heart disease, you may need your cholesterol levels checked more frequently. Ongoing high lipid and cholesterol levels should be treated with medicines if diet and exercise are not working.  . If you smoke, find out from your health care provider  how to quit. If you do not use tobacco, please do not start.  . If you choose to drink alcohol, please do not consume more than 2 drinks per day. One drink is considered to be 12 ounces (355 mL) of beer, 5 ounces (148 mL) of wine, or 1.5 ounces (44 mL) of liquor.  . If you are 60-43 years old, ask your health care provider if you should take aspirin to prevent strokes.  . Use sunscreen. Apply sunscreen liberally and repeatedly throughout the day. You should seek shade when your shadow is shorter than you. Protect yourself by wearing long sleeves, pants, a wide-brimmed hat, and sunglasses year round, whenever you are outdoors.  . Once a month, do a whole body skin exam, using a mirror to look at the skin on your back. Tell your health care provider of new moles, moles that have irregular borders, moles that are larger than a pencil eraser, or moles that have changed in shape or color.
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
## 2016-04-06 ENCOUNTER — Other Ambulatory Visit: Payer: Self-pay | Admitting: Family Medicine

## 2016-04-06 MED ORDER — VITAMIN D3 25 MCG (1000 UT) PO CAPS: 1.0000 | ORAL_CAPSULE | Freq: Every day | ORAL | Status: AC

## 2016-04-06 NOTE — Progress Notes (Signed)
I reviewed health advisor's note, was available for consultation, and agree with documentation and plan.  

## 2016-05-10 ENCOUNTER — Encounter: Payer: Self-pay | Admitting: Family Medicine

## 2016-05-13 ENCOUNTER — Encounter: Payer: Self-pay | Admitting: Family Medicine

## 2016-05-13 DIAGNOSIS — Z1211 Encounter for screening for malignant neoplasm of colon: Secondary | ICD-10-CM | POA: Insufficient documentation

## 2016-05-15 ENCOUNTER — Encounter: Payer: Medicare Other | Admitting: Family Medicine
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
## 2016-07-05 ENCOUNTER — Encounter: Payer: Self-pay | Admitting: Cardiology

## 2016-07-05 ENCOUNTER — Ambulatory Visit (INDEPENDENT_AMBULATORY_CARE_PROVIDER_SITE_OTHER): Payer: Medicare Other | Admitting: Cardiology

## 2016-07-05 VITALS — BP 128/82 | HR 74 | Ht 70.0 in | Wt 229.0 lb

## 2016-07-05 DIAGNOSIS — I10 Essential (primary) hypertension: Secondary | ICD-10-CM | POA: Diagnosis not present

## 2016-07-05 DIAGNOSIS — I2510 Atherosclerotic heart disease of native coronary artery without angina pectoris: Secondary | ICD-10-CM

## 2016-07-05 DIAGNOSIS — E7800 Pure hypercholesterolemia, unspecified: Secondary | ICD-10-CM | POA: Diagnosis not present

## 2016-07-05 NOTE — Patient Instructions (Signed)
Continue your current therapy  I will see you in 6 months.   

## 2016-09-17 ENCOUNTER — Other Ambulatory Visit: Payer: Self-pay | Admitting: Cardiology

## 2016-10-08 ENCOUNTER — Ambulatory Visit (INDEPENDENT_AMBULATORY_CARE_PROVIDER_SITE_OTHER): Payer: Medicare Other | Admitting: Family Medicine

## 2016-10-08 ENCOUNTER — Encounter: Payer: Self-pay | Admitting: Family Medicine

## 2016-10-08 VITALS — BP 142/90 | HR 83 | Temp 98.2°F | Wt 228.2 lb

## 2016-10-08 DIAGNOSIS — S2341XA Sprain of ribs, initial encounter: Secondary | ICD-10-CM | POA: Insufficient documentation

## 2016-10-08 DIAGNOSIS — I2510 Atherosclerotic heart disease of native coronary artery without angina pectoris: Secondary | ICD-10-CM | POA: Diagnosis not present

## 2016-10-08 MED ORDER — METHOCARBAMOL 500 MG PO TABS: 500.0000 mg | ORAL_TABLET | Freq: Two times a day (BID) | ORAL | 0 refills | Status: DC | PRN

## 2016-10-08 MED ORDER — TRAMADOL HCL 50 MG PO TABS: 50.0000 mg | ORAL_TABLET | Freq: Two times a day (BID) | ORAL | 0 refills | Status: DC | PRN

## 2016-10-08 NOTE — Patient Instructions (Addendum)
I think you have a ribcage strain. Treat with gentle stretching, heating pad, and may use robaxin (methocarbamol) muscle relaxant as needed.  Let us know if not improving with treatment.   Chest Wall Pain Chest wall pain is pain in or around the bones and muscles of your chest. Sometimes, an injury causes this pain. Sometimes, the cause may not be known. This pain may take several weeks or longer to get better. Follow these instructions at home: Pay attention to any changes in your symptoms. Take these actions to help with your pain:  Rest as told by your health care provider.  Avoid activities that cause pain. These include any activities that use your chest muscles or your abdominal and side muscles to lift heavy items.  If directed, apply ice to the painful area:  Put ice in a plastic bag.  Place a towel between your skin and the bag.  Leave the ice on for 20 minutes, 2-3 times per day.  Take over-the-counter and prescription medicines only as told by your health care provider.  Do not use tobacco products, including cigarettes, chewing tobacco, and e-cigarettes. If you need help quitting, ask your health care provider.  Keep all follow-up visits as told by your health care provider. This is important. Contact a health care provider if:  You have a fever.  Your chest pain becomes worse.  You have new symptoms. Get help right away if:  You have nausea or vomiting.  You feel sweaty or light-headed.  You have a cough with phlegm (sputum) or you cough up blood.  You develop shortness of breath. This information is not intended to replace advice given to you by your health care provider. Make sure you discuss any questions you have with your health care provider. Document Released: 04/29/2005 Document Revised: 09/07/2015 Document Reviewed: 07/25/2014 Elsevier Interactive Patient Education  2017 [employer].

## 2016-10-08 NOTE — Assessment & Plan Note (Signed)
Anticipate posterior ribcage strain - possibly after mowing lawn - supportive care reviewed - rec gentle stretching, heating pad, and robaxin, with tramadol PRN breakthrough pain. Update if not improving with treatment. Pt and wife agree.

## 2016-10-08 NOTE — Progress Notes (Signed)
BP (!) 142/90   Pulse 83   Temp 98.2 F (36.8 C) (Oral)   Wt 228 lb 4 oz (103.5 kg)   SpO2 95%   BMI 32.75 kg/m    CC: back pain Subjective:    Patient ID: Jesse [name], male    DOB: 09/29/1945, 71 y.o.   MRN: [id number]  HPI: Jesse [name] is a 71 y.o. male presenting on 10/08/2016 for Back Pain   R sided back pain "muscles are sore to the touch". 2d h/o R flank pain. Last night pain traveled to the mid back to left. Laying on couch or recliner helps. Laying in bed aggravates pain. Going up steps worsens pain. Mild constipation. No inciting trauma/injury. Most noticeable when getting up from seated position.   No fevers/chills, shooting pain down legs, rash, numbness/weakness of legs. Denies urinary symptoms, hematuria. Denies bowel/bladder accidents.   Similar episode happened several months ago, did improve over time. This last time was worse.   Has tried tylenol for this. Has also tried daughter's muscle relaxant as well as some expired flexeril.   Relevant past medical, surgical, family and social history reviewed and updated as indicated. Interim medical history since our last visit reviewed. Allergies and medications reviewed and updated. Outpatient Medications Prior to Visit  Medication Sig Dispense Refill  . aspirin 81 MG chewable tablet Chew 1 tablet (81 mg total) by mouth daily.    Marland Kitchen atorvastatin (LIPITOR) 80 MG tablet TAKE 1 TABLET BY MOUTH DAILY 90 tablet 2  . Cholecalciferol (VITAMIN D3) 1000 units CAPS Take 1 capsule (1,000 Units total) by mouth daily. 30 capsule   . glucose blood test strip Use to test sugar daily AS NEEDED 30 each 3  . nitroGLYCERIN (NITROSTAT) 0.4 MG SL tablet Place 1 tablet (0.4 mg total) under the tongue every 5 (five) minutes as needed for chest pain. 25 tablet 11  . amLODipine (NORVASC) 5 MG tablet Take 1 tablet (5 mg total) by mouth daily. 90 tablet 3  . carvedilol (COREG) 12.5 MG tablet Take 1 tablet (12.5 mg total) by mouth 2 (two)  times daily. 180 tablet 3   No facility-administered medications prior to visit.      Per HPI unless specifically indicated in ROS section below Review of Systems     Objective:    BP (!) 142/90   Pulse 83   Temp 98.2 F (36.8 C) (Oral)   Wt 228 lb 4 oz (103.5 kg)   SpO2 95%   BMI 32.75 kg/m   Wt Readings from Last 3 Encounters:  10/08/16 228 lb 4 oz (103.5 kg)  07/05/16 229 lb (103.9 kg)  04/03/16 230 lb 12 oz (104.7 kg)    Physical Exam  Constitutional: He appears well-developed and well-nourished. No distress.  Pulmonary/Chest: He exhibits tenderness.  Tender to palpation posterior inferior right rib cage  Musculoskeletal:  No pain midline spine No paraspinous mm tenderness Neg SLR bilaterally. No pain with int/ext rotation at hip. Neg FABER. No pain at SIJ, GTB or sciatic notch bilaterally.   Nursing note and vitals reviewed.     Assessment & Plan:   Problem List Items Addressed This Visit    Rib sprain, initial encounter - Primary    Anticipate posterior ribcage strain - possibly after mowing lawn - supportive care reviewed - rec gentle stretching, heating pad, and robaxin, with tramadol PRN breakthrough pain. Update if not improving with treatment. Pt and wife agree.  Follow up plan: Return if symptoms worsen or fail to improve.  Ria Bush, MD

## 2016-12-26 ENCOUNTER — Telehealth: Payer: Self-pay | Admitting: Cardiology

## 2016-12-26 NOTE — Telephone Encounter (Signed)
Closed Encounter  °
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
## 2017-01-01 ENCOUNTER — Other Ambulatory Visit: Payer: Self-pay

## 2017-01-01 ENCOUNTER — Encounter: Payer: Self-pay | Admitting: Cardiology

## 2017-01-01 ENCOUNTER — Ambulatory Visit (INDEPENDENT_AMBULATORY_CARE_PROVIDER_SITE_OTHER): Payer: Medicare Other | Admitting: Cardiology

## 2017-01-01 VITALS — BP 136/82 | HR 72 | Ht 70.0 in | Wt 226.0 lb

## 2017-01-01 DIAGNOSIS — E785 Hyperlipidemia, unspecified: Secondary | ICD-10-CM

## 2017-01-01 DIAGNOSIS — I2510 Atherosclerotic heart disease of native coronary artery without angina pectoris: Secondary | ICD-10-CM | POA: Diagnosis not present

## 2017-01-01 DIAGNOSIS — I10 Essential (primary) hypertension: Secondary | ICD-10-CM | POA: Diagnosis not present

## 2017-01-01 MED ORDER — ATORVASTATIN CALCIUM 80 MG PO TABS: 80.0000 mg | ORAL_TABLET | Freq: Every day | ORAL | 3 refills | Status: DC

## 2017-01-01 MED ORDER — NITROGLYCERIN 0.4 MG SL SUBL
0.4000 mg | SUBLINGUAL_TABLET | SUBLINGUAL | 11 refills | Status: DC | PRN
Start: 2017-01-01 — End: 2022-10-31

## 2017-01-01 MED ORDER — AMLODIPINE BESYLATE 5 MG PO TABS: 5.0000 mg | ORAL_TABLET | Freq: Every day | ORAL | 3 refills | Status: DC

## 2017-01-01 MED ORDER — CARVEDILOL 12.5 MG PO TABS: 12.5000 mg | ORAL_TABLET | Freq: Two times a day (BID) | ORAL | 3 refills | Status: DC

## 2017-01-01 NOTE — Patient Instructions (Signed)
Continue your current therapy  We will check blood work today  Watch "Rotten" series on Netflix- show about bees.

## 2017-01-02 LAB — BASIC METABOLIC PANEL
BUN / CREAT RATIO: 17 (ref 10–24)
BUN: 23 mg/dL (ref 8–27)
CHLORIDE: 104 mmol/L (ref 96–106)
CO2: 23 mmol/L (ref 20–29)
CREATININE: 1.36 mg/dL — AB (ref 0.76–1.27)
Calcium: 9.7 mg/dL (ref 8.6–10.2)
GFR calc Af Amer: 60 mL/min/{1.73_m2} (ref >59)
GFR calc non Af Amer: 52 mL/min/{1.73_m2} — ABNORMAL LOW (ref >59)
GLUCOSE: 117 mg/dL — AB (ref 65–99)
POTASSIUM: 4.5 mmol/L (ref 3.5–5.2)
SODIUM: 144 mmol/L (ref 134–144)

## 2017-01-02 LAB — LIPID PANEL W/O CHOL/HDL RATIO
CHOLESTEROL TOTAL: 138 mg/dL (ref 100–199)
HDL: 60 mg/dL (ref >39)
LDL CALC: 61 mg/dL (ref 0–99)
TRIGLYCERIDES: 85 mg/dL (ref 0–149)
VLDL Cholesterol Cal: 17 mg/dL (ref 5–40)

## 2017-01-02 LAB — HEPATIC FUNCTION PANEL
ALK PHOS: 70 IU/L (ref 39–117)
ALT: 25 IU/L (ref 0–44)
AST: 23 IU/L (ref 0–40)
Albumin: 4.3 g/dL (ref 3.5–4.8)
BILIRUBIN TOTAL: 0.7 mg/dL (ref 0.0–1.2)
Bilirubin, Direct: 0.17 mg/dL (ref 0.00–0.40)
Total Protein: 7.1 g/dL (ref 6.0–8.5)

## 2017-02-11 ENCOUNTER — Ambulatory Visit: Payer: Self-pay | Admitting: Adult Health

## 2017-02-11 ENCOUNTER — Telehealth: Payer: Self-pay | Admitting: Family Medicine

## 2017-02-11 NOTE — Telephone Encounter (Signed)
Pt has appt with Dr Darnell Level on 02/13/17 at 9 AM.

## 2017-02-11 NOTE — Telephone Encounter (Signed)
Patient Name: Jesse [name] DOB: 1946/02/23 Initial Comment Caller states husband is having signs of dementia; has appt Thurs 9am; he doesn't really know why she is bringing him in and she needs to know how to hand it; forgetful, stumbling a lot; came in with the cup to get the dog food with and asked where the cup was; Today he got out of the car with grandson and standing there stunned looking and didn't respond when asked if he was ok. A lot of little things; Nurse Assessment Nurse: Ronnald Ramp, RN, Miranda Date/Time (Eastern Time): 02/11/2017 3:00:43 PM Confirm and document reason for call. If symptomatic, describe symptoms. ---Caller states her husband seemed confused and disoriented today. He had gotten out of the car and was just standing there. When asked if something was wrong, he did not respond. He has been stumbling alot and forgetful recently for the last couple of months. Does the patient have any new or worsening symptoms? ---Yes Will a triage be completed? ---Yes Related visit to physician within the last 2 weeks? ---No Does the PT have any chronic conditions? (i.e. diabetes, asthma, etc.) ---Yes List chronic conditions. ---HTN, Diabetes, High Cholesterol Is this a behavioral health or substance abuse call? ---No Guidelines Guideline Title Affirmed Question Affirmed Notes Confusion - Delirium [1] Acting confused (e.g., disoriented, slurred speech) AND [2] brief (now gone) Final Disposition User See Physician within 4 Hours (or PCP triage) Ronnald Ramp, RN, Miranda Comments NO appt available with PCP within recommended time frame. Caller could not make the only appt available at primary office. Appt scheduled for 5:30 at the [place] location with [facility]. Referrals GO TO FACILITY OTHER - SPECIFY Caller Disagree/Comply Comply Caller Understands Yes PreDisposition Did not know what to do

## 2017-02-11 NOTE — Telephone Encounter (Signed)
Will see then. 

## 2017-02-13 ENCOUNTER — Ambulatory Visit: Payer: Medicare Other | Admitting: Family Medicine

## 2017-05-15 DIAGNOSIS — Z23 Encounter for immunization: Secondary | ICD-10-CM | POA: Diagnosis not present

## 2017-06-16 ENCOUNTER — Telehealth: Payer: Self-pay | Admitting: Cardiology

## 2017-06-16 NOTE — Telephone Encounter (Signed)
FYI//Patient wife does not want a callback  Patient wife [name]) calling,  States that she believes that patient has dementia and she is concerned. Patient wife states that patient fell and she is concerned about patient mental health condition.   Becky just wanted to make Dr. [name] aware.

## 2017-06-16 NOTE — Telephone Encounter (Signed)
OK I am aware. Needs to be evaluated by primary care  Lundy Cozart [name] MD, [facility]
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
## 2017-06-18 ENCOUNTER — Telehealth: Payer: Self-pay | Admitting: Cardiology

## 2017-06-18 ENCOUNTER — Ambulatory Visit: Payer: Medicare Other | Admitting: Cardiology
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
## 2017-07-09 ENCOUNTER — Ambulatory Visit (INDEPENDENT_AMBULATORY_CARE_PROVIDER_SITE_OTHER): Payer: Medicare Other | Admitting: Cardiology

## 2017-07-09 ENCOUNTER — Encounter: Payer: Self-pay | Admitting: Cardiology

## 2017-07-09 VITALS — BP 134/82 | HR 66 | Ht 70.0 in | Wt 227.2 lb

## 2017-07-09 DIAGNOSIS — I2510 Atherosclerotic heart disease of native coronary artery without angina pectoris: Secondary | ICD-10-CM

## 2017-07-09 DIAGNOSIS — E7800 Pure hypercholesterolemia, unspecified: Secondary | ICD-10-CM

## 2017-07-09 DIAGNOSIS — I10 Essential (primary) hypertension: Secondary | ICD-10-CM

## 2017-07-09 MED ORDER — CARVEDILOL 12.5 MG PO TABS: 12.5000 mg | ORAL_TABLET | Freq: Two times a day (BID) | ORAL | 3 refills | Status: DC

## 2017-07-09 MED ORDER — AMLODIPINE BESYLATE 5 MG PO TABS: 5.0000 mg | ORAL_TABLET | Freq: Every day | ORAL | 3 refills | Status: DC

## 2017-07-09 MED ORDER — ATORVASTATIN CALCIUM 80 MG PO TABS: 80.0000 mg | ORAL_TABLET | Freq: Every day | ORAL | 3 refills | Status: DC

## 2017-07-09 NOTE — Patient Instructions (Signed)
Continue your current therapy  We will check lab work today  Follow up in 6 months

## 2017-07-09 NOTE — Telephone Encounter (Signed)
Close encounter 

## 2017-07-10 LAB — HEPATIC FUNCTION PANEL
ALBUMIN: 4.2 g/dL (ref 3.5–4.8)
ALK PHOS: 71 IU/L (ref 39–117)
ALT: 26 IU/L (ref 0–44)
AST: 21 IU/L (ref 0–40)
Bilirubin Total: 0.7 mg/dL (ref 0.0–1.2)
Bilirubin, Direct: 0.18 mg/dL (ref 0.00–0.40)
TOTAL PROTEIN: 6.9 g/dL (ref 6.0–8.5)

## 2017-07-10 LAB — LIPID PANEL W/O CHOL/HDL RATIO
Cholesterol, Total: 124 mg/dL (ref 100–199)
HDL: 50 mg/dL (ref >39)
LDL CALC: 60 mg/dL (ref 0–99)
Triglycerides: 69 mg/dL (ref 0–149)
VLDL Cholesterol Cal: 14 mg/dL (ref 5–40)

## 2017-07-10 LAB — BASIC METABOLIC PANEL
BUN/Creatinine Ratio: 11 (ref 10–24)
BUN: 17 mg/dL (ref 8–27)
CO2: 23 mmol/L (ref 20–29)
CREATININE: 1.6 mg/dL — AB (ref 0.76–1.27)
Calcium: 9.2 mg/dL (ref 8.6–10.2)
Chloride: 101 mmol/L (ref 96–106)
GFR, EST AFRICAN AMERICAN: 49 mL/min/{1.73_m2} — AB (ref >59)
GFR, EST NON AFRICAN AMERICAN: 43 mL/min/{1.73_m2} — AB (ref >59)
Glucose: 113 mg/dL — ABNORMAL HIGH (ref 65–99)
POTASSIUM: 4 mmol/L (ref 3.5–5.2)
SODIUM: 140 mmol/L (ref 134–144)

## 2017-07-10 LAB — CBC WITH DIFFERENTIAL/PLATELET
BASOS: 0 %
Basophils Absolute: 0 10*3/uL (ref 0.0–0.2)
EOS (ABSOLUTE): 0.1 10*3/uL (ref 0.0–0.4)
EOS: 1 %
HEMATOCRIT: 42.8 % (ref 37.5–51.0)
HEMOGLOBIN: 14.4 g/dL (ref 13.0–17.7)
IMMATURE GRANS (ABS): 0 10*3/uL (ref 0.0–0.1)
IMMATURE GRANULOCYTES: 0 %
Lymphocytes Absolute: 1.2 10*3/uL (ref 0.7–3.1)
Lymphs: 16 %
MCH: 33.3 pg — ABNORMAL HIGH (ref 26.6–33.0)
MCHC: 33.6 g/dL (ref 31.5–35.7)
MCV: 99 fL — ABNORMAL HIGH (ref 79–97)
MONOCYTES: 10 %
MONOS ABS: 0.7 10*3/uL (ref 0.1–0.9)
NEUTROS PCT: 73 %
Neutrophils Absolute: 5.2 10*3/uL (ref 1.4–7.0)
Platelets: 198 10*3/uL (ref 150–379)
RBC: 4.32 x10E6/uL (ref 4.14–5.80)
RDW: 13.8 % (ref 12.3–15.4)
WBC: 7.1 10*3/uL (ref 3.4–10.8)

## 2017-07-10 LAB — HEMOGLOBIN A1C
ESTIMATED AVERAGE GLUCOSE: 131 mg/dL
Hgb A1c MFr Bld: 6.2 % — ABNORMAL HIGH (ref 4.8–5.6)

## 2017-09-10 ENCOUNTER — Encounter: Payer: Self-pay | Admitting: Family Medicine

## 2017-09-10 ENCOUNTER — Ambulatory Visit (INDEPENDENT_AMBULATORY_CARE_PROVIDER_SITE_OTHER): Payer: Medicare Other | Admitting: Family Medicine

## 2017-09-10 ENCOUNTER — Encounter: Payer: Self-pay | Admitting: Gastroenterology

## 2017-09-10 VITALS — BP 136/78 | HR 69 | Temp 98.4°F | Ht 70.0 in | Wt 227.5 lb

## 2017-09-10 DIAGNOSIS — E559 Vitamin D deficiency, unspecified: Secondary | ICD-10-CM | POA: Diagnosis not present

## 2017-09-10 DIAGNOSIS — I2510 Atherosclerotic heart disease of native coronary artery without angina pectoris: Secondary | ICD-10-CM

## 2017-09-10 DIAGNOSIS — I10 Essential (primary) hypertension: Secondary | ICD-10-CM | POA: Diagnosis not present

## 2017-09-10 DIAGNOSIS — Z1211 Encounter for screening for malignant neoplasm of colon: Secondary | ICD-10-CM | POA: Diagnosis not present

## 2017-09-10 DIAGNOSIS — J310 Chronic rhinitis: Secondary | ICD-10-CM | POA: Diagnosis not present

## 2017-09-10 DIAGNOSIS — Z7289 Other problems related to lifestyle: Secondary | ICD-10-CM

## 2017-09-10 DIAGNOSIS — R2689 Other abnormalities of gait and mobility: Secondary | ICD-10-CM

## 2017-09-10 DIAGNOSIS — R7303 Prediabetes: Secondary | ICD-10-CM | POA: Diagnosis not present

## 2017-09-10 DIAGNOSIS — R5383 Other fatigue: Secondary | ICD-10-CM | POA: Insufficient documentation

## 2017-09-10 DIAGNOSIS — D7589 Other specified diseases of blood and blood-forming organs: Secondary | ICD-10-CM

## 2017-09-10 DIAGNOSIS — F1090 Alcohol use, unspecified, uncomplicated: Secondary | ICD-10-CM

## 2017-09-10 DIAGNOSIS — N289 Disorder of kidney and ureter, unspecified: Secondary | ICD-10-CM | POA: Diagnosis not present

## 2017-09-10 LAB — VITAMIN D 25 HYDROXY (VIT D DEFICIENCY, FRACTURES): VITD: 55.47 ng/mL (ref 30.00–100.00)

## 2017-09-10 LAB — BASIC METABOLIC PANEL
BUN: 14 mg/dL (ref 6–23)
CHLORIDE: 100 meq/L (ref 96–112)
CO2: 29 meq/L (ref 19–32)
Calcium: 9.7 mg/dL (ref 8.4–10.5)
Creatinine, Ser: 1.51 mg/dL — ABNORMAL HIGH (ref 0.40–1.50)
GFR: 48.52 mL/min — ABNORMAL LOW (ref >60.00)
GLUCOSE: 132 mg/dL — AB (ref 70–99)
POTASSIUM: 4.4 meq/L (ref 3.5–5.1)
SODIUM: 137 meq/L (ref 135–145)

## 2017-09-10 LAB — TSH: TSH: 1.49 u[IU]/mL (ref 0.35–4.50)

## 2017-09-10 LAB — VITAMIN B12: Vitamin B-12: 631 pg/mL (ref 211–911)

## 2017-09-10 MED ORDER — ASPIRIN 81 MG PO CHEW: 81.0000 mg | CHEWABLE_TABLET | Freq: Every day | ORAL | Status: DC

## 2017-09-10 NOTE — Assessment & Plan Note (Signed)
Reviewed with patient, encouraged avoid added sugars in diet.  

## 2017-09-10 NOTE — Patient Instructions (Addendum)
Back off alcohol some. Watch sugar levels - as you are prediabetic.  Labs today.  Return for a physical at your convenience as you're due  See referral coordinator for colonoscopy

## 2017-09-10 NOTE — Progress Notes (Signed)
BP 136/78 (BP Location: Left Arm, Patient Position: Sitting, Cuff Size: Normal)   Pulse 69   Temp 98.4 F (36.9 C) (Oral)   Ht 5\' 10"  (1.778 m)   Wt 227 lb 8 oz (103.2 kg)   SpO2 96%   BMI 32.64 kg/m    CC: several concerns today  Subjective:    Patient ID: Jesse [name], male    DOB: [birth year], 72 y.o.   MRN: [id number]  HPI: YADIER BRAMHALL is a 72 y.o. male presenting on 09/10/2017 for Unsteady on feet (C/o fatigue and unsteady on feet for about 3 yrs, worsened in last 6 mo. ); Bleeding/Bruising (Easily bruises. Started about 4 yrs ago, worsened. ); and Medication requests (Pt requests EpiPen)   Here with wife today. "I didn't make this appointment".  Enjoys working with bees. Asks about epi pen - no h/o anaphylactic reaction to bee sting.   Noticing increasing unsteadiness over last 3-5 yrs, worse the past few months. Wife notes he holds on to wall, furniture to prevent falls when at home. Denies significant dizziness, vertigo, presyncope or LOC. Drinks several beers a day. Low energy. Denies paresthesias.  He doesn't feel he has trouble with memory. Wife does have concerns about short term memory. Long term memory is fine.  Overall mood stable, denies significant depression, anxiety  No fevers, abd pain, headache, palpitations, dyspnea.   Worried about increased easy bruising/bleeding, he takes aspirin daily.  Requests colonoscopy referral again.   Relevant past medical, surgical, family and social history reviewed and updated as indicated. Interim medical history since our last visit reviewed. Allergies and medications reviewed and updated. Outpatient Medications Prior to Visit  Medication Sig Dispense Refill  . amLODipine (NORVASC) 5 MG tablet Take 1 tablet (5 mg total) by mouth daily. 90 tablet 3  . atorvastatin (LIPITOR) 80 MG tablet Take 1 tablet (80 mg total) by mouth daily. 90 tablet 3  . carvedilol (COREG) 12.5 MG tablet Take 1 tablet (12.5 mg total) by mouth 2  (two) times daily. 180 tablet 3  . Cholecalciferol (VITAMIN D3) 1000 units CAPS Take 1 capsule (1,000 Units total) by mouth daily. 30 capsule   . glucose blood test strip Use to test sugar daily AS NEEDED 30 each 3  . methocarbamol (ROBAXIN) 500 MG tablet Take 1-2 tablets (500-1,000 mg total) by mouth 2 (two) times daily as needed for muscle spasms. 30 tablet 0  . nitroGLYCERIN (NITROSTAT) 0.4 MG SL tablet Place 1 tablet (0.4 mg total) under the tongue every 5 (five) minutes as needed for chest pain. 25 tablet 11  . traMADol (ULTRAM) 50 MG tablet Take 1 tablet (50 mg total) by mouth 2 (two) times daily as needed. 10 tablet 0  . triamcinolone (NASACORT) 55 MCG/ACT AERO nasal inhaler Place 2 sprays into the nose daily.    . vitamin B-12 (CYANOCOBALAMIN) 1000 MCG tablet Take 1,000 mcg by mouth daily.    Marland Kitchen aspirin 81 MG chewable tablet Chew 1 tablet (81 mg total) by mouth daily.     No facility-administered medications prior to visit.      Per HPI unless specifically indicated in ROS section below Review of Systems     Objective:    BP 136/78 (BP Location: Left Arm, Patient Position: Sitting, Cuff Size: Normal)   Pulse 69   Temp 98.4 F (36.9 C) (Oral)   Ht 5\' 10"  (1.778 m)   Wt 227 lb 8 oz (103.2 kg)   SpO2 96%  BMI 32.64 kg/m   Wt Readings from Last 3 Encounters:  09/10/17 227 lb 8 oz (103.2 kg)  07/09/17 227 lb 3.2 oz (103.1 kg)  01/01/17 226 lb (102.5 kg)    Physical Exam  Constitutional: He appears well-developed and well-nourished. No distress.  HENT:  Mouth/Throat: Oropharynx is clear and moist. No oropharyngeal exudate.  Cardiovascular: Normal rate, regular rhythm and normal heart sounds.  No murmur heard. Pulmonary/Chest: Effort normal and breath sounds normal. No respiratory distress. He has no wheezes. He has no rales.  Musculoskeletal: He exhibits no edema.  Neurological: He is alert. He has normal strength. No cranial nerve deficit or sensory deficit. He displays a  negative Romberg sign. Coordination and gait normal.  CN 2-12 intact Station and gait intact FTN intact EOMI No pronator drift  Psychiatric: He has a normal mood and affect.  Nursing note and vitals reviewed.  Results for orders placed or performed in visit on 42/35/36  Basic metabolic panel  Result Value Ref Range   Glucose 113 (H) 65 - 99 mg/dL   BUN 17 8 - 27 mg/dL   Creatinine, Ser 1.60 (H) 0.76 - 1.27 mg/dL   GFR calc non Af Amer 43 (L) >59 mL/min/1.73   GFR calc Af Amer 49 (L) >59 mL/min/1.73   BUN/Creatinine Ratio 11 10 - 24   Sodium 140 134 - 144 mmol/L   Potassium 4.0 3.5 - 5.2 mmol/L   Chloride 101 96 - 106 mmol/L   CO2 23 20 - 29 mmol/L   Calcium 9.2 8.6 - 10.2 mg/dL  Lipid Panel w/o Chol/HDL Ratio  Result Value Ref Range   Cholesterol, Total 124 100 - 199 mg/dL   Triglycerides 69 0 - 149 mg/dL   HDL 50 >39 mg/dL   VLDL Cholesterol Cal 14 5 - 40 mg/dL   LDL Calculated 60 0 - 99 mg/dL  Hepatic function panel  Result Value Ref Range   Total Protein 6.9 6.0 - 8.5 g/dL   Albumin 4.2 3.5 - 4.8 g/dL   Bilirubin Total 0.7 0.0 - 1.2 mg/dL   Bilirubin, Direct 0.18 0.00 - 0.40 mg/dL   Alkaline Phosphatase 71 39 - 117 IU/L   AST 21 0 - 40 IU/L   ALT 26 0 - 44 IU/L  CBC w/Diff/Platelet  Result Value Ref Range   WBC 7.1 3.4 - 10.8 x10E3/uL   RBC 4.32 4.14 - 5.80 x10E6/uL   Hemoglobin 14.4 13.0 - 17.7 g/dL   Hematocrit 42.8 37.5 - 51.0 %   MCV 99 (H) 79 - 97 fL   MCH 33.3 (H) 26.6 - 33.0 pg   MCHC 33.6 31.5 - 35.7 g/dL   RDW 13.8 12.3 - 15.4 %   Platelets 198 150 - 379 x10E3/uL   Neutrophils 73 Not Estab. %   Lymphs 16 Not Estab. %   Monocytes 10 Not Estab. %   Eos 1 Not Estab. %   Basos 0 Not Estab. %   Neutrophils Absolute 5.2 1.4 - 7.0 x10E3/uL   Lymphocytes Absolute 1.2 0.7 - 3.1 x10E3/uL   Monocytes Absolute 0.7 0.1 - 0.9 x10E3/uL   EOS (ABSOLUTE) 0.1 0.0 - 0.4 x10E3/uL   Basophils Absolute 0.0 0.0 - 0.2 x10E3/uL   Immature Granulocytes 0 Not Estab. %    Immature Grans (Abs) 0.0 0.0 - 0.1 x10E3/uL  HgB A1c  Result Value Ref Range   Hgb A1c MFr Bld 6.2 (H) 4.8 - 5.6 %   Est. average glucose Bld gHb Est-mCnc  131 mg/dL      Assessment & Plan:  Encouraged he return for wellness visit as overdue Problem List Items Addressed This Visit    CAD (coronary artery disease)   Relevant Medications   aspirin 81 MG chewable tablet   Essential hypertension    Chronic, stable on current regimen, followed by cardiology.       Relevant Medications   aspirin 81 MG chewable tablet   Fatigue - Primary    Ongoing over months to a year. Some concern with memory, unsteadiness. Benign neurological exam today.  Check labs today for reversible causes of fatigue (TSH, B12, D).   Consider OSA cause and further eval if above unrevealing.       Relevant Orders   Basic metabolic panel   Vitamin L27   TSH   Imbalance    Of unclear cause. Denies periph neuropathy, presyncope, vertigo. Encouraged decreased alcohol consumption (regularly drinks). Check labs today. Non-focal neurological exam today.       Prediabetes    Reviewed with patient, encouraged avoid added sugars in diet.       Renal insufficiency    Acute bump in Cr on latest check - repeat today.       Relevant Orders   Basic metabolic panel   RHINITIS, CHRONIC    Nasal steroid helps.       RESOLVED: Special screening for malignant neoplasms, colon   Relevant Orders   Ambulatory referral to Gastroenterology   Vitamin D deficiency    Update level today. Taking vit D 1000 IU daily.       Relevant Orders   VITAMIN D 25 Hydroxy (Vit-D Deficiency, Fractures)    Other Visit Diagnoses    Macrocytosis without anemia       Relevant Orders   Vitamin B12   Habitual alcohol use       Relevant Orders   Vitamin B12       Meds ordered this encounter  Medications  . aspirin 81 MG chewable tablet    Sig: Chew 1 tablet (81 mg total) by mouth daily.   Orders Placed This Encounter    Procedures  . Basic metabolic panel  . Vitamin B12  . VITAMIN D 25 Hydroxy (Vit-D Deficiency, Fractures)  . TSH  . Ambulatory referral to Gastroenterology    Referral Priority:   Routine    Referral Type:   Consultation    Referral Reason:   Specialty Services Required    Number of Visits Requested:   1    Follow up plan: No follow-ups on file.  Ria Bush, MD

## 2017-09-10 NOTE — Assessment & Plan Note (Signed)
Acute bump in Cr on latest check - repeat today.

## 2017-09-10 NOTE — Assessment & Plan Note (Signed)
Of unclear cause. Denies periph neuropathy, presyncope, vertigo. Encouraged decreased alcohol consumption (regularly drinks). Check labs today. Non-focal neurological exam today.

## 2017-09-10 NOTE — Assessment & Plan Note (Signed)
Chronic, stable on current regimen, followed by cardiology.

## 2017-09-10 NOTE — Assessment & Plan Note (Signed)
Ongoing over months to a year. Some concern with memory, unsteadiness. Benign neurological exam today.  Check labs today for reversible causes of fatigue (TSH, B12, D).   Consider OSA cause and further eval if above unrevealing.

## 2017-09-10 NOTE — Assessment & Plan Note (Signed)
Nasal steroid helps.

## 2017-09-10 NOTE — Assessment & Plan Note (Signed)
Update level today. Taking vit D 1000 IU daily.

## 2017-11-03 ENCOUNTER — Ambulatory Visit (AMBULATORY_SURGERY_CENTER): Payer: Self-pay | Admitting: *Deleted

## 2017-11-03 ENCOUNTER — Other Ambulatory Visit: Payer: Self-pay

## 2017-11-03 VITALS — Ht 70.0 in | Wt 227.6 lb

## 2017-11-03 DIAGNOSIS — Z1211 Encounter for screening for malignant neoplasm of colon: Secondary | ICD-10-CM

## 2017-11-03 NOTE — Progress Notes (Signed)
Denies allergies to eggs or soy products. Denies complications with sedation or anesthesia. Denies O2 use. Denies use of diet or weight loss medications.  Emmi instructions given for colonoscopy.  

## 2017-11-10 HISTORY — PX: COLONOSCOPY: SHX174

## 2017-11-17 ENCOUNTER — Encounter: Payer: Self-pay | Admitting: Gastroenterology

## 2017-11-17 ENCOUNTER — Ambulatory Visit (AMBULATORY_SURGERY_CENTER): Payer: Medicare Other | Admitting: Gastroenterology

## 2017-11-17 ENCOUNTER — Other Ambulatory Visit: Payer: Self-pay

## 2017-11-17 VITALS — BP 106/70 | HR 66 | Temp 98.9°F | Resp 17 | Ht 70.0 in | Wt 227.0 lb

## 2017-11-17 DIAGNOSIS — Z1211 Encounter for screening for malignant neoplasm of colon: Secondary | ICD-10-CM

## 2017-11-17 DIAGNOSIS — D123 Benign neoplasm of transverse colon: Secondary | ICD-10-CM | POA: Diagnosis not present

## 2017-11-17 MED ORDER — SODIUM CHLORIDE 0.9 % IV SOLN: 500.0000 mL | Freq: Once | INTRAVENOUS | Status: DC

## 2017-11-17 NOTE — Op Note (Signed)
[facility] Patient Name: Xadrian Craighead Procedure Date: 11/17/2017 11:15 AM MRN: [id number] Endoscopist: Remo Lipps P. Havery Moros , MD Age: 72 Referring MD:  Date of Birth: 19-Jan-1946 Gender: Male Account #: [id number] Procedure:                Colonoscopy Indications:              Screening for colorectal malignant neoplasm Medicines:                Monitored Anesthesia Care Procedure:                Pre-Anesthesia Assessment:                           - Prior to the procedure, a History and Physical                            was performed, and patient medications and                            allergies were reviewed. The patient's tolerance of                            previous anesthesia was also reviewed. The risks                            and benefits of the procedure and the sedation                            options and risks were discussed with the patient.                            All questions were answered, and informed consent                            was obtained. Prior Anticoagulants: The patient has                            taken no previous anticoagulant or antiplatelet                            agents. ASA Grade Assessment: III - A patient with                            severe systemic disease. After reviewing the risks                            and benefits, the patient was deemed in                            satisfactory condition to undergo the procedure.                           After obtaining informed consent, the colonoscope  was passed under direct vision. Throughout the                            procedure, the patient's blood pressure, pulse, and                            oxygen saturations were monitored continuously. The                            Model CF-HQ190L [id number]) scope was introduced                            through the anus and advanced to the the cecum,                            identified  by appendiceal orifice and ileocecal                            valve. The colonoscopy was performed without                            difficulty. The patient tolerated the procedure                            well. The quality of the bowel preparation was                            good. The ileocecal valve, appendiceal orifice, and                            rectum were photographed. Scope In: 11:20:08 AM Scope Out: 11:39:13 AM Scope Withdrawal Time: 0 hours 11 minutes 21 seconds  Total Procedure Duration: 0 hours 19 minutes 5 seconds  Findings:                 The perianal and digital rectal examinations were                            normal.                           Two sessile polyps were found in the transverse                            colon. The polyps were 4 to 5 mm in size. These                            polyps were removed with a cold snare. Resection                            and retrieval were complete.                           Multiple medium-mouthed diverticula were found in  the sigmoid colon and transverse colon.                           Internal hemorrhoids were found during retroflexion.                           The exam was otherwise without abnormality. Complications:            No immediate complications. Estimated blood loss:                            Minimal. Estimated Blood Loss:     Estimated blood loss was minimal. Impression:               - Two 4 to 5 mm polyps in the transverse colon,                            removed with a cold snare. Resected and retrieved.                           - Diverticulosis in the sigmoid colon and in the                            transverse colon.                           - Internal hemorrhoids.                           - The examination was otherwise normal. Recommendation:           - Patient has a contact number available for                            emergencies. The signs and  symptoms of potential                            delayed complications were discussed with the                            patient. Return to normal activities tomorrow.                            Written discharge instructions were provided to the                            patient.                           - Resume previous diet.                           - Continue present medications.                           - Await pathology results.                           -  Repeat colonoscopy for surveillance based on                            pathology results. Remo Lipps P. Armbruster, MD 11/17/2017 11:42:53 AM This report has been signed electronically.

## 2017-11-17 NOTE — Progress Notes (Signed)
Called to room to assist during endoscopic procedure.  Patient ID and intended procedure confirmed with present staff. Received instructions for my participation in the procedure from the performing physician.  

## 2017-11-17 NOTE — Progress Notes (Signed)
Pt's states no medical or surgical changes since previsit or office visit. 

## 2017-11-17 NOTE — Progress Notes (Signed)
A/ox3 pleased with MAC, report to RN 

## 2017-11-18 ENCOUNTER — Telehealth: Payer: Self-pay

## 2017-11-18 NOTE — Telephone Encounter (Signed)
  Follow up Call-  Call back number 11/17/2017  Post procedure Call Back phone  # [phone]  Permission to leave phone message Yes  Some recent data might be hidden     No answer and no ID on VM Angela/2nd call-back [place]

## 2017-11-18 NOTE — Telephone Encounter (Signed)
Attempted to reach patient for post-procedure f/u call. No answer. Left message that we will make another attempt to reach him again later today and for him to please not hesitate to call us if he has any questions/concerns regarding his care. 

## 2017-11-20 ENCOUNTER — Encounter: Payer: Self-pay | Admitting: Gastroenterology

## 2017-11-24 ENCOUNTER — Encounter: Payer: Self-pay | Admitting: Family Medicine

## 2017-12-13 DIAGNOSIS — J209 Acute bronchitis, unspecified: Secondary | ICD-10-CM | POA: Diagnosis not present

## 2017-12-13 DIAGNOSIS — J069 Acute upper respiratory infection, unspecified: Secondary | ICD-10-CM | POA: Diagnosis not present
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
## 2018-01-29 ENCOUNTER — Ambulatory Visit (INDEPENDENT_AMBULATORY_CARE_PROVIDER_SITE_OTHER): Payer: Medicare Other | Admitting: Cardiology

## 2018-01-29 ENCOUNTER — Encounter: Payer: Self-pay | Admitting: Cardiology

## 2018-01-29 VITALS — BP 142/72 | HR 84 | Ht 70.0 in | Wt 222.2 lb

## 2018-01-29 DIAGNOSIS — R011 Cardiac murmur, unspecified: Secondary | ICD-10-CM

## 2018-01-29 DIAGNOSIS — I10 Essential (primary) hypertension: Secondary | ICD-10-CM | POA: Diagnosis not present

## 2018-01-29 DIAGNOSIS — I2510 Atherosclerotic heart disease of native coronary artery without angina pectoris: Secondary | ICD-10-CM | POA: Diagnosis not present

## 2018-01-29 DIAGNOSIS — E7800 Pure hypercholesterolemia, unspecified: Secondary | ICD-10-CM

## 2018-01-29 NOTE — Patient Instructions (Signed)
We will schedule you for an Echocardiogram. Will call with results  Continue your current therapy

## 2018-02-02 ENCOUNTER — Other Ambulatory Visit: Payer: Self-pay

## 2018-02-02 ENCOUNTER — Ambulatory Visit (HOSPITAL_COMMUNITY): Payer: Medicare Other | Attending: Cardiovascular Disease

## 2018-02-02 DIAGNOSIS — E7800 Pure hypercholesterolemia, unspecified: Secondary | ICD-10-CM

## 2018-02-02 DIAGNOSIS — R011 Cardiac murmur, unspecified: Secondary | ICD-10-CM

## 2018-02-02 DIAGNOSIS — I2510 Atherosclerotic heart disease of native coronary artery without angina pectoris: Secondary | ICD-10-CM | POA: Diagnosis not present

## 2018-02-02 DIAGNOSIS — I10 Essential (primary) hypertension: Secondary | ICD-10-CM | POA: Insufficient documentation

## 2018-02-02 DIAGNOSIS — I252 Old myocardial infarction: Secondary | ICD-10-CM | POA: Diagnosis not present

## 2018-04-20 DIAGNOSIS — Z23 Encounter for immunization: Secondary | ICD-10-CM | POA: Diagnosis not present

## 2018-04-24 ENCOUNTER — Telehealth: Payer: Self-pay | Admitting: Cardiology

## 2018-04-24 NOTE — Telephone Encounter (Signed)
Returned call to pt he states that he only wants to talk to [place] informed pt that cheryl/ Dr [name] are DOD today and can I help him. Pt states that he needs a 96M f/u appointment "just scheduled my follow up appt and send it to me in [facility] appt scheduled for 07-27-2018 @ 820am. This will be in Mychart for pt to view. Will forward message to [facility]

## 2018-04-24 NOTE — Telephone Encounter (Signed)
Per pt call he would like Jesse [name] to call him back please.   Pt would not illiterate.

## 2018-06-15 ENCOUNTER — Ambulatory Visit (INDEPENDENT_AMBULATORY_CARE_PROVIDER_SITE_OTHER): Payer: PPO | Admitting: Family Medicine

## 2018-06-15 ENCOUNTER — Encounter: Payer: Self-pay | Admitting: Family Medicine

## 2018-06-15 VITALS — BP 122/66 | HR 99 | Temp 98.6°F | Ht 70.0 in | Wt 225.5 lb

## 2018-06-15 DIAGNOSIS — M5416 Radiculopathy, lumbar region: Secondary | ICD-10-CM | POA: Insufficient documentation

## 2018-06-15 MED ORDER — TRAMADOL HCL 50 MG PO TABS: 50.0000 mg | ORAL_TABLET | Freq: Two times a day (BID) | ORAL | 0 refills | Status: DC | PRN

## 2018-06-15 NOTE — Patient Instructions (Addendum)
Symptoms sound like pinched nerve of spine - take aleve 1 pill twice daily with meals for next 5 days, use tramadol for breakthrough pain.  Gentle stretching of the back - exercises provided today.  Watch for fever, trouble controlling bowels/bladder, weakness of the let, let us know if any of this is happening.  If ongoing symptoms past several weeks let me know as well.

## 2018-06-15 NOTE — Assessment & Plan Note (Signed)
Anticipate R sciatica - supportive care reviewed. Treat conservatively given no neurological deficits on exam. Treat with OTC naprosyn 1 tab BID scheduled x5 days. Provided with exercises from [facility] pt advisor. Tramadol refilled for PRN breakthrough pain. Red flags to seek further care reviewed.

## 2018-06-15 NOTE — Progress Notes (Signed)
BP 122/66 (BP Location: Left Arm, Patient Position: Sitting, Cuff Size: Large)   Pulse 99   Temp 98.6 F (37 C) (Oral)   Ht 5\' 10"  (1.778 m)   Wt 225 lb 8 oz (102.3 kg)   SpO2 95%   BMI 32.36 kg/m    CC: back pain Subjective:    Patient ID: Jesse [name], male    DOB: 26-Jul-1945, 73 y.o.   MRN: [id number]  HPI: MEHDI GIRONDA is a 73 y.o. male presenting on 06/15/2018 for Back Pain (C/o right lower back pain radiating down posterior leg to the knee. Started 3-4 days ago. Was seen 1.5 yrs ago for same pain. Pt accompanied by his wife, [name]. )   Sudden onset lower back pain started 3-4 d ago at R buttock radiating down posterior leg to kneecap. Describes burning pain at kneecap coming from back as well as numbness down right anterior thigh. Some R leg weakness from pain.   Has been treating with tylenol and naprosyn. Tramadol helps more than these (had some left over from 2018).   No inciting trauma/injury.  No fevers/chills, bowel/bladder incontinence.      Relevant past medical, surgical, family and social history reviewed and updated as indicated. Interim medical history since our last visit reviewed. Allergies and medications reviewed and updated. Outpatient Medications Prior to Visit  Medication Sig Dispense Refill  . aspirin 81 MG chewable tablet Chew 1 tablet (81 mg total) by mouth daily.    Marland Kitchen atorvastatin (LIPITOR) 80 MG tablet Take 1 tablet (80 mg total) by mouth daily. 90 tablet 3  . Cholecalciferol (VITAMIN D3) 1000 units CAPS Take 1 capsule (1,000 Units total) by mouth daily. 30 capsule   . glucose blood test strip Use to test sugar daily AS NEEDED 30 each 3  . methocarbamol (ROBAXIN) 500 MG tablet Take 1-2 tablets (500-1,000 mg total) by mouth 2 (two) times daily as needed for muscle spasms. 30 tablet 0  . nitroGLYCERIN (NITROSTAT) 0.4 MG SL tablet Place 1 tablet (0.4 mg total) under the tongue every 5 (five) minutes as needed for chest pain. 25 tablet 11  .  triamcinolone (NASACORT) 55 MCG/ACT AERO nasal inhaler Place 2 sprays into the nose daily.    . vitamin B-12 (CYANOCOBALAMIN) 1000 MCG tablet Take 1,000 mcg by mouth daily.    . traMADol (ULTRAM) 50 MG tablet Take 1 tablet (50 mg total) by mouth 2 (two) times daily as needed. 10 tablet 0  . amLODipine (NORVASC) 5 MG tablet Take 1 tablet (5 mg total) by mouth daily. 90 tablet 3  . carvedilol (COREG) 12.5 MG tablet Take 1 tablet (12.5 mg total) by mouth 2 (two) times daily. 180 tablet 3   Facility-Administered Medications Prior to Visit  Medication Dose Route Frequency Provider Last Rate Last Dose  . 0.9 %  sodium chloride infusion  500 mL Intravenous Once Armbruster, Carlota Raspberry, MD         Per HPI unless specifically indicated in ROS section below Review of Systems Objective:    BP 122/66 (BP Location: Left Arm, Patient Position: Sitting, Cuff Size: Large)   Pulse 99   Temp 98.6 F (37 C) (Oral)   Ht 5\' 10"  (1.778 m)   Wt 225 lb 8 oz (102.3 kg)   SpO2 95%   BMI 32.36 kg/m   Wt Readings from Last 3 Encounters:  06/15/18 225 lb 8 oz (102.3 kg)  01/29/18 222 lb 3.2 oz (100.8 kg)  11/17/17  227 lb (103 kg)    Physical Exam Vitals signs and nursing note reviewed.  Constitutional:      Appearance: Normal appearance. He is not ill-appearing.  Musculoskeletal: Normal range of motion.        General: No tenderness.     Right lower leg: No edema.     Left lower leg: No edema.     Comments: No pain midline spine Tender R lower lateral lumbar paraspinous mm tenderness Neg SLR bilaterally. No pain with int/ext rotation at hip. Neg FABER. No pain at SIJ, GTB bilaterally. Some discomfort at R sciatic notch  Neurological:     General: No focal deficit present.     Mental Status: He is alert.     Sensory: Sensation is intact.     Motor: Motor function is intact.     Coordination: Coordination is intact.     Deep Tendon Reflexes:     Reflex Scores:      Patellar reflexes are 2+ on the  right side and 2+ on the left side.    Comments: 5/5 strength BLE Sensation inact bilaterally       Lab Results  Component Value Date   HGBA1C 6.2 (H) 07/09/2017    Assessment & Plan:  Encouraged he return for wellness visit.  Problem List Items Addressed This Visit    Right lumbar radiculopathy - Primary    Anticipate R sciatica - supportive care reviewed. Treat conservatively given no neurological deficits on exam. Treat with OTC naprosyn 1 tab BID scheduled x5 days. Provided with exercises from [facility] pt advisor. Tramadol refilled for PRN breakthrough pain. Red flags to seek further care reviewed.           Meds ordered this encounter  Medications  . traMADol (ULTRAM) 50 MG tablet    Sig: Take 1 tablet (50 mg total) by mouth 2 (two) times daily as needed.    Dispense:  10 tablet    Refill:  0   No orders of the defined types were placed in this encounter.   Follow up plan: No follow-ups on file.  Ria Bush, MD

## 2018-06-23 ENCOUNTER — Encounter: Payer: Self-pay | Admitting: Family Medicine

## 2018-06-23 ENCOUNTER — Ambulatory Visit (INDEPENDENT_AMBULATORY_CARE_PROVIDER_SITE_OTHER)
Admission: RE | Admit: 2018-06-23 | Discharge: 2018-06-23 | Disposition: A | Discharge status: Home / Self Care | Payer: PPO | Source: Ambulatory Visit | Attending: Family Medicine | Admitting: Family Medicine

## 2018-06-23 ENCOUNTER — Ambulatory Visit (INDEPENDENT_AMBULATORY_CARE_PROVIDER_SITE_OTHER): Payer: PPO | Admitting: Family Medicine

## 2018-06-23 VITALS — BP 122/80 | HR 87 | Temp 98.5°F | Ht 70.0 in

## 2018-06-23 DIAGNOSIS — M47816 Spondylosis without myelopathy or radiculopathy, lumbar region: Secondary | ICD-10-CM | POA: Diagnosis not present

## 2018-06-23 DIAGNOSIS — M5431 Sciatica, right side: Secondary | ICD-10-CM | POA: Diagnosis not present

## 2018-06-23 DIAGNOSIS — M5136 Other intervertebral disc degeneration, lumbar region: Secondary | ICD-10-CM | POA: Diagnosis not present

## 2018-06-23 MED ORDER — PREDNISONE 20 MG PO TABS: ORAL_TABLET | ORAL | 0 refills | Status: DC

## 2018-06-23 MED ORDER — TRAMADOL HCL 50 MG PO TABS: 50.0000 mg | ORAL_TABLET | Freq: Two times a day (BID) | ORAL | 0 refills | Status: DC | PRN

## 2018-06-23 NOTE — Patient Instructions (Signed)
Take tramadol for pain.  Prednisone with food.  Go to the lab on the way out.  We'll contact you with your xray report. Take care.  Glad to see you.

## 2018-06-23 NOTE — Progress Notes (Signed)
Back pain.  Started near the end of 05/2018.  Recently seen for R lower back pain by PCP.  R lower back pain, radiates down the R leg.  Used tramadol recently.  Sleeping in recliner recently for comfort.  Burning pain.  Local paresthesia on R thigh.  No L sided sx.  No B/B sx.  No FCNAVD.  No trauma, no trigger.  He wasn't better in the meantime, so he came in for recheck.  Meds, vitals, and allergies reviewed.   ROS: Per HPI unless specifically indicated in ROS section   nad ncat Neck supple, no LA ctab rrr SEM noted-old finding.  R SLR neg Distally S/S intact R leg.  Back not ttp No rash, no bruising No cva pain Abdomen soft nontender.   Skin well perfused.

## 2018-06-24 DIAGNOSIS — M5431 Sciatica, right side: Secondary | ICD-10-CM | POA: Insufficient documentation

## 2018-06-24 NOTE — Assessment & Plan Note (Signed)
Take tramadol for pain.  Routine cautions given Prednisone with food.  Routine cautions given  See notes on imaging. He may end up needing MRI if he does not have response to the prednisone.  Rationale for treatment and work-up discussed with patient and he understood.

## 2018-06-26 ENCOUNTER — Telehealth: Payer: Self-pay

## 2018-06-26 NOTE — Telephone Encounter (Signed)
Jesse [name] - Client Nonclinical Telephone Record [name] - Client Client Site [name] Physician Renford Dills - MD Contact Type Call Who Is Calling Patient / Member / Family / Caregiver Caller Name Jesse [name] Phone Number [phone] Call Type Message Only Information Provided Reason for Call Returning a Call from the Office Initial [place] states he is returning a call Additional Comment Call Closed By: [employer] Transaction Date/Time: 06/25/2018 5:53:55 PM (ET)
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
## 2018-07-27 ENCOUNTER — Encounter: Payer: Self-pay | Admitting: Cardiology

## 2018-07-27 ENCOUNTER — Other Ambulatory Visit: Payer: Self-pay

## 2018-07-27 ENCOUNTER — Ambulatory Visit: Payer: Medicare Other | Admitting: Cardiology

## 2018-07-27 VITALS — BP 115/67 | HR 72 | Ht 70.0 in | Wt 222.0 lb

## 2018-07-27 DIAGNOSIS — R7303 Prediabetes: Secondary | ICD-10-CM

## 2018-07-27 DIAGNOSIS — I340 Nonrheumatic mitral (valve) insufficiency: Secondary | ICD-10-CM | POA: Diagnosis not present

## 2018-07-27 DIAGNOSIS — I2510 Atherosclerotic heart disease of native coronary artery without angina pectoris: Secondary | ICD-10-CM | POA: Diagnosis not present

## 2018-07-27 DIAGNOSIS — I341 Nonrheumatic mitral (valve) prolapse: Secondary | ICD-10-CM | POA: Diagnosis not present

## 2018-07-27 DIAGNOSIS — I10 Essential (primary) hypertension: Secondary | ICD-10-CM | POA: Diagnosis not present

## 2018-07-27 DIAGNOSIS — E785 Hyperlipidemia, unspecified: Secondary | ICD-10-CM | POA: Diagnosis not present

## 2018-07-28 LAB — CBC WITH DIFFERENTIAL/PLATELET
BASOS: 1 %
Basophils Absolute: 0.1 10*3/uL (ref 0.0–0.2)
EOS (ABSOLUTE): 0.2 10*3/uL (ref 0.0–0.4)
Eos: 2 %
Hematocrit: 38.6 % (ref 37.5–51.0)
Hemoglobin: 13.6 g/dL (ref 13.0–17.7)
Immature Grans (Abs): 0 10*3/uL (ref 0.0–0.1)
Immature Granulocytes: 0 %
LYMPHS ABS: 1.3 10*3/uL (ref 0.7–3.1)
Lymphs: 20 %
MCH: 33.7 pg — AB (ref 26.6–33.0)
MCHC: 35.2 g/dL (ref 31.5–35.7)
MCV: 96 fL (ref 79–97)
MONOS ABS: 0.6 10*3/uL (ref 0.1–0.9)
Monocytes: 9 %
NEUTROS ABS: 4.5 10*3/uL (ref 1.4–7.0)
Neutrophils: 68 %
PLATELETS: 199 10*3/uL (ref 150–450)
RBC: 4.03 x10E6/uL — ABNORMAL LOW (ref 4.14–5.80)
RDW: 12.7 % (ref 11.6–15.4)
WBC: 6.7 10*3/uL (ref 3.4–10.8)

## 2018-07-28 LAB — BASIC METABOLIC PANEL
BUN/Creatinine Ratio: 14 (ref 10–24)
BUN: 18 mg/dL (ref 8–27)
CO2: 23 mmol/L (ref 20–29)
Calcium: 9.7 mg/dL (ref 8.6–10.2)
Chloride: 103 mmol/L (ref 96–106)
Creatinine, Ser: 1.33 mg/dL — ABNORMAL HIGH (ref 0.76–1.27)
GFR calc Af Amer: 61 mL/min/{1.73_m2} (ref >59)
GFR calc non Af Amer: 53 mL/min/{1.73_m2} — ABNORMAL LOW (ref >59)
Glucose: 118 mg/dL — ABNORMAL HIGH (ref 65–99)
POTASSIUM: 4 mmol/L (ref 3.5–5.2)
Sodium: 140 mmol/L (ref 134–144)

## 2018-07-28 LAB — LIPID PANEL
Chol/HDL Ratio: 2.7 ratio (ref 0.0–5.0)
Cholesterol, Total: 125 mg/dL (ref 100–199)
HDL: 47 mg/dL (ref >39)
LDL Calculated: 61 mg/dL (ref 0–99)
Triglycerides: 84 mg/dL (ref 0–149)
VLDL Cholesterol Cal: 17 mg/dL (ref 5–40)

## 2018-07-28 LAB — HEPATIC FUNCTION PANEL
ALT: 28 IU/L (ref 0–44)
AST: 27 IU/L (ref 0–40)
Albumin: 4.1 g/dL (ref 3.7–4.7)
Alkaline Phosphatase: 75 IU/L (ref 39–117)
Bilirubin Total: 0.8 mg/dL (ref 0.0–1.2)
Bilirubin, Direct: 0.22 mg/dL (ref 0.00–0.40)
Total Protein: 6.5 g/dL (ref 6.0–8.5)

## 2018-07-28 LAB — HEMOGLOBIN A1C
Est. average glucose Bld gHb Est-mCnc: 128 mg/dL
Hgb A1c MFr Bld: 6.1 % — ABNORMAL HIGH (ref 4.8–5.6)

## 2018-09-26 ENCOUNTER — Other Ambulatory Visit: Payer: Self-pay | Admitting: Cardiology

## 2018-09-28 NOTE — Telephone Encounter (Signed)
Atorvastatin and amlodipine refilled.

## 2018-10-19 ENCOUNTER — Telehealth: Payer: Self-pay | Admitting: Cardiology

## 2018-10-19 ENCOUNTER — Other Ambulatory Visit: Payer: Self-pay | Admitting: Cardiology

## 2018-10-19 NOTE — Telephone Encounter (Signed)
New message   Patient states that he would like a call from [place] he did not state what the call is about.

## 2018-10-19 NOTE — Telephone Encounter (Signed)
Follow Up:; ° ° °Returning your call. °

## 2018-10-20 NOTE — Telephone Encounter (Signed)
Returned call to patient this morning.He stated he needed a refill on one of his medications.Stated pharmacy had a mix up.Spoke to patient's pharmacy [facility] on [address] in [place] was told patient had 2 prescriptions ready for pick up,Atorvastatin and Amlodipine.He has refills on all medications, no refills needed at this time.Stated he also needs to schedule appointment with Dr.Jordan in Aug or Sept.Advised no openings.Advised I will put him on my list to schedule a Oct appointment.Advised I will call him back when Oct schedule opens.Advised to call sooner if needed.

## 2019-02-02 ENCOUNTER — Ambulatory Visit (HOSPITAL_COMMUNITY): Payer: PPO | Attending: Cardiology

## 2019-02-02 ENCOUNTER — Other Ambulatory Visit: Payer: Self-pay

## 2019-02-02 DIAGNOSIS — R011 Cardiac murmur, unspecified: Secondary | ICD-10-CM | POA: Insufficient documentation

## 2019-02-02 DIAGNOSIS — I2510 Atherosclerotic heart disease of native coronary artery without angina pectoris: Secondary | ICD-10-CM | POA: Insufficient documentation

## 2019-02-03 ENCOUNTER — Encounter: Payer: Self-pay | Admitting: Family Medicine

## 2019-02-03 DIAGNOSIS — I341 Nonrheumatic mitral (valve) prolapse: Secondary | ICD-10-CM | POA: Insufficient documentation
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
## 2019-02-11 ENCOUNTER — Encounter: Payer: Self-pay | Admitting: Cardiology

## 2019-02-11 ENCOUNTER — Ambulatory Visit (INDEPENDENT_AMBULATORY_CARE_PROVIDER_SITE_OTHER): Payer: PPO | Admitting: Cardiology

## 2019-02-11 ENCOUNTER — Other Ambulatory Visit: Payer: Self-pay

## 2019-02-11 VITALS — BP 125/76 | HR 62 | Temp 97.3°F | Ht 70.0 in | Wt 225.6 lb

## 2019-02-11 DIAGNOSIS — R7303 Prediabetes: Secondary | ICD-10-CM | POA: Diagnosis not present

## 2019-02-11 DIAGNOSIS — I341 Nonrheumatic mitral (valve) prolapse: Secondary | ICD-10-CM | POA: Diagnosis not present

## 2019-02-11 DIAGNOSIS — I340 Nonrheumatic mitral (valve) insufficiency: Secondary | ICD-10-CM

## 2019-02-11 DIAGNOSIS — I2510 Atherosclerotic heart disease of native coronary artery without angina pectoris: Secondary | ICD-10-CM | POA: Diagnosis not present

## 2019-02-11 DIAGNOSIS — E785 Hyperlipidemia, unspecified: Secondary | ICD-10-CM

## 2019-02-11 DIAGNOSIS — Z23 Encounter for immunization: Secondary | ICD-10-CM

## 2019-02-11 NOTE — Patient Instructions (Signed)
Medication Instructions:  Continue same medications If you need a refill on your cardiac medications before your next appointment, please call your pharmacy.   Lab work: Fasting lipid,hepatic panels,bmet,a1c before appointment in April   Testing/Procedures: None ordered  Follow-Up: At [facility], you and your health needs are our priority.  As part of our continuing mission to provide you with exceptional heart care, we have created designated Provider Care Teams.  These Care Teams include your primary Cardiologist (physician) and Advanced Practice Providers (APPs -  Physician Assistants and Nurse Practitioners) who all work together to provide you with the care you need, when you need it. . Schedule follow up appointment in 6 months    Call in Jan to schedule a April appointment

## 2019-03-29 DIAGNOSIS — S61002A Unspecified open wound of left thumb without damage to nail, initial encounter: Secondary | ICD-10-CM | POA: Diagnosis not present

## 2019-04-06 DIAGNOSIS — S61002D Unspecified open wound of left thumb without damage to nail, subsequent encounter: Secondary | ICD-10-CM | POA: Diagnosis not present

## 2019-07-10 ENCOUNTER — Ambulatory Visit: Payer: PPO | Attending: Internal Medicine

## 2019-07-10 DIAGNOSIS — Z23 Encounter for immunization: Secondary | ICD-10-CM | POA: Insufficient documentation

## 2019-07-10 NOTE — Progress Notes (Signed)
   Covid-19 Vaccination Clinic  Name:  Jesse [name]    MRN: [id number] DOB: 10/27/1945  07/10/2019  [name] was observed post Covid-19 immunization for 15 minutes without incidence. He was provided with Vaccine Information Sheet and instruction to access the V-Safe system.   [name] was instructed to call 911 with any severe reactions post vaccine: Marland Kitchen Difficulty breathing  . Swelling of your face and throat  . A fast heartbeat  . A bad rash all over your body  . Dizziness and weakness    Immunizations Administered    Name Date Dose VIS Date Route   Moderna COVID-19 Vaccine 07/10/2019 12:08 PM 0.5 mL 04/13/2019 Intramuscular   Manufacturer: Moderna   Lot: [id number]   [id number]

## 2019-08-07 ENCOUNTER — Ambulatory Visit: Payer: PPO | Attending: Internal Medicine

## 2019-08-07 DIAGNOSIS — Z23 Encounter for immunization: Secondary | ICD-10-CM

## 2019-08-07 NOTE — Progress Notes (Signed)
   Covid-19 Vaccination Clinic  Name:  BETZALEL DOHNER    MRN: [id number] DOB: 13-Jul-1945  08/07/2019  Mr. Ohlsen was observed post Covid-19 immunization for 15 minutes without incident. He was provided with Vaccine Information Sheet and instruction to access the V-Safe system.   Mr. Nicoloff was instructed to call 911 with any severe reactions post vaccine: Marland Kitchen Difficulty breathing  . Swelling of face and throat  . A fast heartbeat  . A bad rash all over body  . Dizziness and weakness   Immunizations Administered    Name Date Dose VIS Date Route   Moderna COVID-19 Vaccine 08/07/2019  9:25 AM 0.5 mL 04/13/2019 Intramuscular   Manufacturer: Moderna   Lot: [id number]   [id number]

## 2019-08-12 DIAGNOSIS — I341 Nonrheumatic mitral (valve) prolapse: Secondary | ICD-10-CM | POA: Diagnosis not present

## 2019-08-12 DIAGNOSIS — R7303 Prediabetes: Secondary | ICD-10-CM | POA: Diagnosis not present

## 2019-08-12 DIAGNOSIS — E785 Hyperlipidemia, unspecified: Secondary | ICD-10-CM | POA: Diagnosis not present

## 2019-08-12 DIAGNOSIS — I2510 Atherosclerotic heart disease of native coronary artery without angina pectoris: Secondary | ICD-10-CM | POA: Diagnosis not present

## 2019-08-13 LAB — BASIC METABOLIC PANEL
BUN/Creatinine Ratio: 14 (ref 10–24)
BUN: 19 mg/dL (ref 8–27)
CO2: 23 mmol/L (ref 20–29)
Calcium: 9.4 mg/dL (ref 8.6–10.2)
Chloride: 99 mmol/L (ref 96–106)
Creatinine, Ser: 1.37 mg/dL — ABNORMAL HIGH (ref 0.76–1.27)
GFR calc Af Amer: 59 mL/min/{1.73_m2} — ABNORMAL LOW (ref >59)
GFR calc non Af Amer: 51 mL/min/{1.73_m2} — ABNORMAL LOW (ref >59)
Glucose: 110 mg/dL — ABNORMAL HIGH (ref 65–99)
Potassium: 3.9 mmol/L (ref 3.5–5.2)
Sodium: 137 mmol/L (ref 134–144)

## 2019-08-13 LAB — LIPID PANEL
Chol/HDL Ratio: 3 ratio (ref 0.0–5.0)
Cholesterol, Total: 125 mg/dL (ref 100–199)
HDL: 41 mg/dL (ref >39)
LDL Chol Calc (NIH): 66 mg/dL (ref 0–99)
Triglycerides: 97 mg/dL (ref 0–149)
VLDL Cholesterol Cal: 18 mg/dL (ref 5–40)

## 2019-08-13 LAB — HEPATIC FUNCTION PANEL
ALT: 25 IU/L (ref 0–44)
AST: 21 IU/L (ref 0–40)
Albumin: 4.1 g/dL (ref 3.7–4.7)
Alkaline Phosphatase: 79 IU/L (ref 39–117)
Bilirubin Total: 0.7 mg/dL (ref 0.0–1.2)
Bilirubin, Direct: 0.19 mg/dL (ref 0.00–0.40)
Total Protein: 6.8 g/dL (ref 6.0–8.5)

## 2019-08-13 LAB — HEMOGLOBIN A1C
Est. average glucose Bld gHb Est-mCnc: 123 mg/dL
Hgb A1c MFr Bld: 5.9 % — ABNORMAL HIGH (ref 4.8–5.6)
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
## 2019-08-17 ENCOUNTER — Other Ambulatory Visit: Payer: Self-pay

## 2019-08-17 ENCOUNTER — Encounter: Payer: Self-pay | Admitting: Cardiology

## 2019-08-17 ENCOUNTER — Ambulatory Visit (INDEPENDENT_AMBULATORY_CARE_PROVIDER_SITE_OTHER): Payer: PPO | Admitting: Cardiology

## 2019-08-17 VITALS — BP 144/82 | HR 74 | Ht 70.0 in | Wt 224.6 lb

## 2019-08-17 DIAGNOSIS — I2510 Atherosclerotic heart disease of native coronary artery without angina pectoris: Secondary | ICD-10-CM | POA: Diagnosis not present

## 2019-08-17 DIAGNOSIS — I340 Nonrheumatic mitral (valve) insufficiency: Secondary | ICD-10-CM

## 2019-08-17 DIAGNOSIS — N1831 Chronic kidney disease, stage 3a: Secondary | ICD-10-CM

## 2019-08-17 DIAGNOSIS — E7800 Pure hypercholesterolemia, unspecified: Secondary | ICD-10-CM

## 2019-08-17 DIAGNOSIS — E785 Hyperlipidemia, unspecified: Secondary | ICD-10-CM

## 2019-08-17 DIAGNOSIS — I341 Nonrheumatic mitral (valve) prolapse: Secondary | ICD-10-CM | POA: Diagnosis not present

## 2019-08-17 MED ORDER — ATORVASTATIN CALCIUM 80 MG PO TABS: 80.0000 mg | ORAL_TABLET | Freq: Every day | ORAL | 3 refills | Status: DC

## 2019-08-17 MED ORDER — AMLODIPINE BESYLATE 5 MG PO TABS: 5.0000 mg | ORAL_TABLET | Freq: Every day | ORAL | 3 refills | Status: DC

## 2019-08-17 MED ORDER — CARVEDILOL 12.5 MG PO TABS: 12.5000 mg | ORAL_TABLET | Freq: Two times a day (BID) | ORAL | 3 refills | Status: DC

## 2019-08-17 NOTE — Patient Instructions (Signed)
Medication Instructions:  Continue same medications *If you need a refill on your cardiac medications before your next appointment, please call your pharmacy*   Lab Work: None ordered    Testing/Procedures: Schedule Echo in October   Follow-Up: At [facility], you and your health needs are our priority.  As part of our continuing mission to provide you with exceptional heart care, we have created designated Provider Care Teams.  These Care Teams include your primary Cardiologist (physician) and Advanced Practice Providers (APPs -  Physician Assistants and Nurse Practitioners) who all work together to provide you with the care you need, when you need it.  We recommend signing up for the patient portal called "MyChart".  Sign up information is provided on this After Visit Summary.  MyChart is used to connect with patients for Virtual Visits (Telemedicine).  Patients are able to view lab/test results, encounter notes, upcoming appointments, etc.  Non-urgent messages can be sent to your provider as well.   To learn more about what you can do with MyChart, go to [email or web address].    Your next appointment:  Call in July to schedule Echo and follow up appointment with Dr.Jordan     The format for your next appointment: Office    Provider:  Dr.Jordan

## 2019-10-26 ENCOUNTER — Telehealth: Payer: Self-pay

## 2019-10-26 NOTE — Telephone Encounter (Signed)
Received fax note from access nurse that was not in access nurse portal. Call was cancelled by Becky pts wife(DPR signed) due to pt not wanting to triage with nurse. I called and spoke with [facility] and she said last night pt was having back pain and difficulty getting out of chair; yesterday pt picked up gas can and twisted back. Pt said when she called earlier this morning pt came in room and pt is doing a lot better this morning. I spoke with [facility] and she said pt seems to be doing OK now;he is resting but does not think needs to be seen. Becky will cb if pt needs appt. FYI to Dr Danise Mina.  Sent access nurse note for scanning into pts chart and put copy in Dr Gutierrez's in box just for review.

## 2019-10-26 NOTE — Telephone Encounter (Signed)
Noted. Thanks.

## 2020-02-15 ENCOUNTER — Other Ambulatory Visit: Payer: Self-pay

## 2020-02-15 ENCOUNTER — Ambulatory Visit (HOSPITAL_COMMUNITY): Payer: PPO | Attending: Cardiology

## 2020-02-15 DIAGNOSIS — E7800 Pure hypercholesterolemia, unspecified: Secondary | ICD-10-CM | POA: Diagnosis not present

## 2020-02-15 DIAGNOSIS — N1831 Chronic kidney disease, stage 3a: Secondary | ICD-10-CM | POA: Diagnosis not present

## 2020-02-15 DIAGNOSIS — I340 Nonrheumatic mitral (valve) insufficiency: Secondary | ICD-10-CM | POA: Insufficient documentation

## 2020-02-15 DIAGNOSIS — E785 Hyperlipidemia, unspecified: Secondary | ICD-10-CM

## 2020-02-15 DIAGNOSIS — I341 Nonrheumatic mitral (valve) prolapse: Secondary | ICD-10-CM | POA: Insufficient documentation

## 2020-02-15 DIAGNOSIS — I2510 Atherosclerotic heart disease of native coronary artery without angina pectoris: Secondary | ICD-10-CM | POA: Diagnosis not present

## 2020-02-15 LAB — ECHOCARDIOGRAM COMPLETE
Area-P 1/2: 4.31 cm2
S' Lateral: 3.4 cm
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
## 2020-02-21 ENCOUNTER — Ambulatory Visit: Payer: PPO | Admitting: Cardiology

## 2020-02-21 ENCOUNTER — Other Ambulatory Visit: Payer: Self-pay

## 2020-02-21 ENCOUNTER — Encounter: Payer: Self-pay | Admitting: Cardiology

## 2020-02-21 VITALS — BP 148/84 | HR 72 | Ht 70.0 in | Wt 215.0 lb

## 2020-02-21 DIAGNOSIS — I2510 Atherosclerotic heart disease of native coronary artery without angina pectoris: Secondary | ICD-10-CM

## 2020-02-21 DIAGNOSIS — E7800 Pure hypercholesterolemia, unspecified: Secondary | ICD-10-CM

## 2020-02-21 DIAGNOSIS — I10 Essential (primary) hypertension: Secondary | ICD-10-CM | POA: Diagnosis not present

## 2020-02-21 DIAGNOSIS — I341 Nonrheumatic mitral (valve) prolapse: Secondary | ICD-10-CM | POA: Diagnosis not present

## 2020-02-21 DIAGNOSIS — I340 Nonrheumatic mitral (valve) insufficiency: Secondary | ICD-10-CM

## 2020-02-21 DIAGNOSIS — N1831 Chronic kidney disease, stage 3a: Secondary | ICD-10-CM

## 2020-02-21 MED ORDER — ATORVASTATIN CALCIUM 80 MG PO TABS
80.0000 mg | ORAL_TABLET | Freq: Every day | ORAL | 3 refills | Status: DC
Start: 2020-02-21 — End: 2021-03-07

## 2020-02-21 MED ORDER — CARVEDILOL 12.5 MG PO TABS: 12.5000 mg | ORAL_TABLET | Freq: Two times a day (BID) | ORAL | 3 refills | Status: DC

## 2020-02-21 MED ORDER — AMLODIPINE BESYLATE 5 MG PO TABS: 5.0000 mg | ORAL_TABLET | Freq: Every day | ORAL | 3 refills | Status: DC

## 2020-02-21 NOTE — Patient Instructions (Signed)
Medication Instructions:  Continue same medications *If you need a refill on your cardiac medications before your next appointment, please call your pharmacy*   Lab Work: Have fasting bmet,lipid and hepatic panels done 1 week before April appointment    Testing/Procedures: Echo- 08/2020   Follow-Up: At [facility], you and your health needs are our priority.  As part of our continuing mission to provide you with exceptional heart care, we have created designated Provider Care Teams.  These Care Teams include your primary Cardiologist (physician) and Advanced Practice Providers (APPs -  Physician Assistants and Nurse Practitioners) who all work together to provide you with the care you need, when you need it.  We recommend signing up for the patient portal called "MyChart".  Sign up information is provided on this After Visit Summary.  MyChart is used to connect with patients for Virtual Visits (Telemedicine).  Patients are able to view lab/test results, encounter notes, upcoming appointments, etc.  Non-urgent messages can be sent to your provider as well.   To learn more about what you can do with MyChart, go to [email or web address].    Your next appointment: 6 months   Call in Feb to schedule April appointment     The format for your next appointment: Office   Provider:  Dr.Jordan

## 2020-03-01 ENCOUNTER — Telehealth: Payer: Self-pay | Admitting: Cardiology

## 2020-03-01 NOTE — Telephone Encounter (Signed)
Spoke to patient he stated he wanted to let Dr.Jordan know he is ready to have heart valve replaced.Advised I will speak to [place] tomorrow and call you back.

## 2020-03-01 NOTE — Telephone Encounter (Signed)
Patient calling back to request to speak with Malachy Mood, says she doesn't have to call right now, anytime is fine for him. Patient states it will take just a minute, does not want to pass along any messages, just requests a call from [place].   Thank you!

## 2020-03-01 NOTE — Telephone Encounter (Signed)
Tried to call pt he will not provide message. He only wants to talk to cheryl

## 2020-03-01 NOTE — Telephone Encounter (Signed)
Patient is requesting to speak with Jesse [name]. No context provided. He states he will further discuss when he speaks with her. Please call.

## 2020-03-03 ENCOUNTER — Other Ambulatory Visit: Payer: Self-pay | Admitting: Cardiology

## 2020-03-03 ENCOUNTER — Telehealth: Payer: Self-pay

## 2020-03-03 DIAGNOSIS — I10 Essential (primary) hypertension: Secondary | ICD-10-CM

## 2020-03-03 DIAGNOSIS — I340 Nonrheumatic mitral (valve) insufficiency: Secondary | ICD-10-CM

## 2020-03-03 DIAGNOSIS — I2510 Atherosclerotic heart disease of native coronary artery without angina pectoris: Secondary | ICD-10-CM

## 2020-03-03 DIAGNOSIS — Z01812 Encounter for preprocedural laboratory examination: Secondary | ICD-10-CM

## 2020-03-03 MED ORDER — SODIUM CHLORIDE 0.9% FLUSH: 3.0000 mL | Freq: Two times a day (BID) | INTRAVENOUS | Status: DC

## 2020-03-03 NOTE — Telephone Encounter (Signed)
Spoke to patient Dr.Jordan advised he will need a right and left cardiac cath.Cath scheduled with Dr.Jordan 03/20/20.Advised he will need to arrive at [facility] hospital at 6:30 am for IV hydration.He will come to office to see me and go over instructions and have pre cath labs on 03/13/20.

## 2020-03-03 NOTE — Telephone Encounter (Signed)
    [place] [address] Dept: [phone] Loc: [place]  03/03/2020  You are scheduled for a Cardiac Cath on Monday 03/20/20 with Dr.Jordan.  1. Please arrive at the [facility] (Main Entrance A) at [facility]: [address] [place], [place] [address] at 6:30 am for IV Hydration.    Special note: Every effort is made to have your procedure done on time. Please understand that emergencies sometimes delay scheduled procedures.  2. Diet: Do not eat solid foods after midnight.  The patient may have clear liquids until 5am upon the day of the procedure.  3. Labs: You will need to have blood drawn on Monday 03/13/20. You do not need to be fasting.  Covid Test at [facility] Friday 03/17/20 at 11:35 am.You will need to [facility] until after cath.  4. Medication instructions in preparation for your procedure:      On the morning of your procedure, take your Aspirin 81 mg. and any morning medicines NOT listed above.  You may use sips of water.  5. Plan for one night stay--bring personal belongings. 6. Bring a current list of your medications and current insurance cards. 7. You MUST have a responsible person to drive you home. 8. Someone MUST be with you the first 24 hours after you arrive home or your discharge will be delayed. 9. Please wear clothes that are easy to get on and off and wear slip-on shoes.  Thank you for allowing [place] to care for you!   -- [facility]    Follow Up Appointment scheduled with Dr.Jordan Wednesday 04/05/20 at 9:00 am.

## 2020-03-04 ENCOUNTER — Emergency Department (HOSPITAL_COMMUNITY): Payer: PPO

## 2020-03-04 ENCOUNTER — Emergency Department (HOSPITAL_COMMUNITY)
Admission: EM | Admit: 2020-03-04 | Discharge: 2020-03-04 | Disposition: A | Discharge status: Home / Self Care | Payer: PPO | Attending: Emergency Medicine | Admitting: Emergency Medicine

## 2020-03-04 ENCOUNTER — Encounter (HOSPITAL_COMMUNITY): Payer: Self-pay | Admitting: Emergency Medicine

## 2020-03-04 ENCOUNTER — Other Ambulatory Visit: Payer: Self-pay

## 2020-03-04 DIAGNOSIS — Z7982 Long term (current) use of aspirin: Secondary | ICD-10-CM | POA: Diagnosis not present

## 2020-03-04 DIAGNOSIS — R4781 Slurred speech: Secondary | ICD-10-CM | POA: Diagnosis not present

## 2020-03-04 DIAGNOSIS — R42 Dizziness and giddiness: Secondary | ICD-10-CM | POA: Diagnosis not present

## 2020-03-04 DIAGNOSIS — S51012A Laceration without foreign body of left elbow, initial encounter: Secondary | ICD-10-CM | POA: Diagnosis not present

## 2020-03-04 DIAGNOSIS — Z86006 Personal history of melanoma in-situ: Secondary | ICD-10-CM | POA: Insufficient documentation

## 2020-03-04 DIAGNOSIS — R531 Weakness: Secondary | ICD-10-CM | POA: Diagnosis not present

## 2020-03-04 DIAGNOSIS — I2510 Atherosclerotic heart disease of native coronary artery without angina pectoris: Secondary | ICD-10-CM | POA: Diagnosis not present

## 2020-03-04 DIAGNOSIS — R4182 Altered mental status, unspecified: Secondary | ICD-10-CM | POA: Diagnosis not present

## 2020-03-04 DIAGNOSIS — W19XXXA Unspecified fall, initial encounter: Secondary | ICD-10-CM | POA: Diagnosis not present

## 2020-03-04 DIAGNOSIS — I10 Essential (primary) hypertension: Secondary | ICD-10-CM | POA: Diagnosis not present

## 2020-03-04 DIAGNOSIS — S59902A Unspecified injury of left elbow, initial encounter: Secondary | ICD-10-CM | POA: Diagnosis present

## 2020-03-04 DIAGNOSIS — S0081XA Abrasion of other part of head, initial encounter: Secondary | ICD-10-CM | POA: Insufficient documentation

## 2020-03-04 DIAGNOSIS — Y92009 Unspecified place in unspecified non-institutional (private) residence as the place of occurrence of the external cause: Secondary | ICD-10-CM | POA: Diagnosis not present

## 2020-03-04 DIAGNOSIS — Z79899 Other long term (current) drug therapy: Secondary | ICD-10-CM | POA: Diagnosis not present

## 2020-03-04 DIAGNOSIS — Z87891 Personal history of nicotine dependence: Secondary | ICD-10-CM | POA: Insufficient documentation

## 2020-03-04 DIAGNOSIS — R0902 Hypoxemia: Secondary | ICD-10-CM | POA: Diagnosis not present

## 2020-03-04 DIAGNOSIS — R001 Bradycardia, unspecified: Secondary | ICD-10-CM | POA: Diagnosis not present

## 2020-03-04 DIAGNOSIS — R270 Ataxia, unspecified: Secondary | ICD-10-CM | POA: Diagnosis not present

## 2020-03-04 LAB — I-STAT CHEM 8, ED
BUN: 16 mg/dL (ref 8–23)
Calcium, Ion: 1.15 mmol/L (ref 1.15–1.40)
Chloride: 108 mmol/L (ref 98–111)
Creatinine, Ser: 1.9 mg/dL — ABNORMAL HIGH (ref 0.61–1.24)
Glucose, Bld: 96 mg/dL (ref 70–99)
HCT: 39 % (ref 39.0–52.0)
Hemoglobin: 13.3 g/dL (ref 13.0–17.0)
Potassium: 3.2 mmol/L — ABNORMAL LOW (ref 3.5–5.1)
Sodium: 148 mmol/L — ABNORMAL HIGH (ref 135–145)
TCO2: 23 mmol/L (ref 22–32)

## 2020-03-04 LAB — RAPID URINE DRUG SCREEN, HOSP PERFORMED
Amphetamines: NOT DETECTED
Barbiturates: NOT DETECTED
Benzodiazepines: NOT DETECTED
Cocaine: NOT DETECTED
Opiates: NOT DETECTED
Tetrahydrocannabinol: NOT DETECTED

## 2020-03-04 LAB — CBC
HCT: 39.3 % (ref 39.0–52.0)
Hemoglobin: 13.4 g/dL (ref 13.0–17.0)
MCH: 34.4 pg — ABNORMAL HIGH (ref 26.0–34.0)
MCHC: 34.1 g/dL (ref 30.0–36.0)
MCV: 100.8 fL — ABNORMAL HIGH (ref 80.0–100.0)
Platelets: 223 10*3/uL (ref 150–400)
RBC: 3.9 MIL/uL — ABNORMAL LOW (ref 4.22–5.81)
RDW: 13.8 % (ref 11.5–15.5)
WBC: 7.4 10*3/uL (ref 4.0–10.5)
nRBC: 0 % (ref 0.0–0.2)

## 2020-03-04 LAB — COMPREHENSIVE METABOLIC PANEL
ALT: 30 U/L (ref 0–44)
AST: 29 U/L (ref 15–41)
Albumin: 4.1 g/dL (ref 3.5–5.0)
Alkaline Phosphatase: 64 U/L (ref 38–126)
Anion gap: 12 (ref 5–15)
BUN: 15 mg/dL (ref 8–23)
CO2: 24 mmol/L (ref 22–32)
Calcium: 9.2 mg/dL (ref 8.9–10.3)
Chloride: 110 mmol/L (ref 98–111)
Creatinine, Ser: 1.57 mg/dL — ABNORMAL HIGH (ref 0.61–1.24)
GFR, Estimated: 46 mL/min — ABNORMAL LOW (ref >60)
Glucose, Bld: 99 mg/dL (ref 70–99)
Potassium: 3.2 mmol/L — ABNORMAL LOW (ref 3.5–5.1)
Sodium: 146 mmol/L — ABNORMAL HIGH (ref 135–145)
Total Bilirubin: 0.8 mg/dL (ref 0.3–1.2)
Total Protein: 7 g/dL (ref 6.5–8.1)

## 2020-03-04 LAB — PROTIME-INR
INR: 1 (ref 0.8–1.2)
Prothrombin Time: 12.5 seconds (ref 11.4–15.2)

## 2020-03-04 LAB — DIFFERENTIAL
Abs Immature Granulocytes: 0.04 10*3/uL (ref 0.00–0.07)
Basophils Absolute: 0.1 10*3/uL (ref 0.0–0.1)
Basophils Relative: 1 %
Eosinophils Absolute: 0.1 10*3/uL (ref 0.0–0.5)
Eosinophils Relative: 2 %
Immature Granulocytes: 1 %
Lymphocytes Relative: 29 %
Lymphs Abs: 2.2 10*3/uL (ref 0.7–4.0)
Monocytes Absolute: 0.4 10*3/uL (ref 0.1–1.0)
Monocytes Relative: 5 %
Neutro Abs: 4.6 10*3/uL (ref 1.7–7.7)
Neutrophils Relative %: 62 %

## 2020-03-04 LAB — URINALYSIS, ROUTINE W REFLEX MICROSCOPIC
Bilirubin Urine: NEGATIVE
Glucose, UA: NEGATIVE mg/dL
Ketones, ur: NEGATIVE mg/dL
Leukocytes,Ua: NEGATIVE
Nitrite: NEGATIVE
Protein, ur: NEGATIVE mg/dL
Specific Gravity, Urine: 1.006 (ref 1.005–1.030)
pH: 5 (ref 5.0–8.0)

## 2020-03-04 LAB — APTT: aPTT: 31 seconds (ref 24–36)

## 2020-03-04 MED ORDER — IOHEXOL 350 MG/ML SOLN
50.0000 mL | Freq: Once | INTRAVENOUS | Status: AC | PRN
  Administered 2020-03-04: 50 mL via INTRAVENOUS

## 2020-03-04 MED ORDER — DIAZEPAM 5 MG/ML IJ SOLN
2.5000 mg | Freq: Once | INTRAMUSCULAR | Status: AC
  Administered 2020-03-04: 2.5 mg via INTRAVENOUS
  Filled 2020-03-04: qty 2

## 2020-03-04 NOTE — ED Provider Notes (Addendum)
Signout note  74 year old male presenting to ER with concern for fall, vertigo, ataxia.  Labs ordered, grossly normal, creatinine elevated but no baseline.  CTA head neck ordered to further assess.  If negative, consider MRI.  7:22 AM received signout from [place]  Updated patient on results of CTA, agreeable to stay for MRI but does not want to stay any longer  10:00 AM reassessed patient, reviewed results, patient still having some difficulty walking though he reports vertigo sensation has resolved.  Given the persistent difficulty walking, concern for possible ataxia, recommended staying in ER for  PT consult, neuro consult.  Patient however adamant that he does not want any additional work-up or evaluation today and desires to go home.  He understands my concerns but feels this is similar to his baseline and wishes to pursue outpatient management this time.  Recommended he have close follow-up with his primary doctor, additionally recommended outpatient follow-up with neurology.  I reviewed from precautions with patient and son in detail at bedside.   Lucrezia Starch, MD 03/05/20 [phone]
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
## 2020-03-04 NOTE — ED Triage Notes (Signed)
Pt presents to ED BIB [facility] from home. Pt has no complaints. LSN - 2300 03/03/2020. Per family pt doesn't drink but appears intoxicated. Pt also had 2 falls this night, lac to R forehead, no blood thinners.

## 2020-03-04 NOTE — Discharge Instructions (Addendum)
Please schedule follow-up with your primary doctor, neurology.  Recommend following up with home health.  If you have additional falls, difficulty walking, any change in your speech, vision, chest pain or difficulty breathing, recommend return to ER for further evaluation.

## 2020-03-04 NOTE — ED Notes (Signed)
Patient transported to CT 

## 2020-03-06 ENCOUNTER — Telehealth: Payer: Self-pay | Admitting: Cardiology

## 2020-03-06 ENCOUNTER — Telehealth: Payer: Self-pay | Admitting: Family Medicine

## 2020-03-06 NOTE — Telephone Encounter (Signed)
May schedule at 12:30pm one day this week. Thanks.

## 2020-03-06 NOTE — Telephone Encounter (Signed)
Spoke to patient's wife Dr.Jordan's advice given.They will come to office as planned on Mon 11/1 to see me to pick up cath instructions and have pre cath labs.

## 2020-03-06 NOTE — Telephone Encounter (Signed)
Pt wife called in stated that pt had to go to the Er over the weekend and they told him that he needs to see Neuro.  She she would like to speak with cheryl to see if they need to hold off on the test that he was going to be having?       [place]

## 2020-03-06 NOTE — Telephone Encounter (Signed)
Spoke to patient's wife she stated husband fell over the weekend. Stated he was dizzy,he did not black out.Stated he went to [facility] ED and was advised to see Neurology.She wanted to know if he needed to see Neurology before he had heart cath already scheduled for 11/8.Advised to keep heart cath as scheduled since he does not have appointment with Neurology yet.I will make Dr.Jordan aware.

## 2020-03-06 NOTE — Telephone Encounter (Signed)
Disregard referral request.  Jesse [name] says she can use the one placed by ER doc.

## 2020-03-06 NOTE — Telephone Encounter (Signed)
[name] (spouse) called to schedule er follow.  Pt fell 10/23 and went to [place].  They wanted him to follow up ASAP.  Next appointment was 11/3 @ 8.  She wanted to know if pt could be seen sooner.  ER stated pt needs referral to neurology this week.  He was dx with ataxia/ Pt also has heart valve surgery coming.  Can pt be worked in sooner

## 2020-03-06 NOTE — Telephone Encounter (Signed)
Transfer pt to [place]

## 2020-03-06 NOTE — Telephone Encounter (Signed)
Patient's wife returning call. 

## 2020-03-06 NOTE — Telephone Encounter (Signed)
Yes we can still do cath this will be diagnostic only. Then Neuro eval.  Israel Wunder [name] MD, [facility]

## 2020-03-06 NOTE — Telephone Encounter (Signed)
Returned call to patient's wife no answer.LMTC. 

## 2020-03-06 NOTE — Telephone Encounter (Signed)
Spouse called back wanting to get pt neurology appointment

## 2020-03-06 NOTE — Telephone Encounter (Signed)
Discussed neuro referral in chart from [facility] ED Jesse [name].  She states referral was opened but it was not completed and missing info.    Dr. Darnell Level, would you be able to place referral to [place]?

## 2020-03-07 NOTE — Telephone Encounter (Signed)
Lvm asking pt to call back.  Per Dr. Darnell Level, pt can be added at 12:30 any day this wk (except Thurs) for ER f/u OV.

## 2020-03-07 NOTE — Telephone Encounter (Signed)
Best number for becky [phone]  [name] called she declined schedule appointment any sooner than 11/3

## 2020-03-07 NOTE — Telephone Encounter (Signed)
Noted.  FYI to Dr. G. 

## 2020-03-13 DIAGNOSIS — I2510 Atherosclerotic heart disease of native coronary artery without angina pectoris: Secondary | ICD-10-CM | POA: Diagnosis not present

## 2020-03-13 DIAGNOSIS — I10 Essential (primary) hypertension: Secondary | ICD-10-CM | POA: Diagnosis not present

## 2020-03-13 DIAGNOSIS — Z01812 Encounter for preprocedural laboratory examination: Secondary | ICD-10-CM | POA: Diagnosis not present

## 2020-03-13 LAB — BASIC METABOLIC PANEL
BUN/Creatinine Ratio: 9 — ABNORMAL LOW (ref 10–24)
BUN: 12 mg/dL (ref 8–27)
CO2: 22 mmol/L (ref 20–29)
Calcium: 9.5 mg/dL (ref 8.6–10.2)
Chloride: 106 mmol/L (ref 96–106)
Creatinine, Ser: 1.3 mg/dL — ABNORMAL HIGH (ref 0.76–1.27)
GFR calc Af Amer: 62 mL/min/{1.73_m2} (ref >59)
GFR calc non Af Amer: 54 mL/min/{1.73_m2} — ABNORMAL LOW (ref >59)
Glucose: 108 mg/dL — ABNORMAL HIGH (ref 65–99)
Potassium: 4.2 mmol/L (ref 3.5–5.2)
Sodium: 139 mmol/L (ref 134–144)

## 2020-03-13 LAB — CBC WITH DIFFERENTIAL/PLATELET
Basophils Absolute: 0.1 10*3/uL (ref 0.0–0.2)
Basos: 1 %
EOS (ABSOLUTE): 0.1 10*3/uL (ref 0.0–0.4)
Eos: 1 %
Hematocrit: 39.8 % (ref 37.5–51.0)
Hemoglobin: 13.6 g/dL (ref 13.0–17.7)
Immature Grans (Abs): 0 10*3/uL (ref 0.0–0.1)
Immature Granulocytes: 0 %
Lymphocytes Absolute: 1.4 10*3/uL (ref 0.7–3.1)
Lymphs: 22 %
MCH: 34.4 pg — ABNORMAL HIGH (ref 26.6–33.0)
MCHC: 34.2 g/dL (ref 31.5–35.7)
MCV: 101 fL — ABNORMAL HIGH (ref 79–97)
Monocytes Absolute: 0.7 10*3/uL (ref 0.1–0.9)
Monocytes: 11 %
Neutrophils Absolute: 4.2 10*3/uL (ref 1.4–7.0)
Neutrophils: 65 %
Platelets: 218 10*3/uL (ref 150–450)
RBC: 3.95 x10E6/uL — ABNORMAL LOW (ref 4.14–5.80)
RDW: 13 % (ref 11.6–15.4)
WBC: 6.5 10*3/uL (ref 3.4–10.8)

## 2020-03-13 LAB — PT AND PTT
INR: 1 (ref 0.9–1.2)
Prothrombin Time: 10.3 s (ref 9.1–12.0)
aPTT: 28 s (ref 24–33)

## 2020-03-15 ENCOUNTER — Ambulatory Visit (INDEPENDENT_AMBULATORY_CARE_PROVIDER_SITE_OTHER): Payer: PPO | Admitting: Family Medicine

## 2020-03-15 ENCOUNTER — Other Ambulatory Visit: Payer: Self-pay

## 2020-03-15 ENCOUNTER — Encounter: Payer: Self-pay | Admitting: Family Medicine

## 2020-03-15 VITALS — BP 116/64 | HR 68 | Temp 97.5°F | Ht 70.0 in | Wt 213.1 lb

## 2020-03-15 DIAGNOSIS — R2689 Other abnormalities of gait and mobility: Secondary | ICD-10-CM | POA: Diagnosis not present

## 2020-03-15 DIAGNOSIS — I680 Cerebral amyloid angiopathy: Secondary | ICD-10-CM | POA: Diagnosis not present

## 2020-03-15 DIAGNOSIS — I341 Nonrheumatic mitral (valve) prolapse: Secondary | ICD-10-CM

## 2020-03-15 DIAGNOSIS — E854 Organ-limited amyloidosis: Secondary | ICD-10-CM | POA: Diagnosis not present

## 2020-03-15 DIAGNOSIS — I2510 Atherosclerotic heart disease of native coronary artery without angina pectoris: Secondary | ICD-10-CM | POA: Diagnosis not present

## 2020-03-15 DIAGNOSIS — W19XXXA Unspecified fall, initial encounter: Secondary | ICD-10-CM

## 2020-03-15 NOTE — Assessment & Plan Note (Addendum)
Noted on recent MRI. Reviewed with patient as well as increased risk of dementia and brain bleed. Continue good BP and chol control, keep upcoming neuro appt.  MMSE today 28/30 (missed 2 orientation questions)

## 2020-03-15 NOTE — Progress Notes (Signed)
This visit was conducted in person.  BP 116/64 (BP Location: Left Arm, Patient Position: Sitting, Cuff Size: Large)   Pulse 68   Temp (!) 97.5 F (36.4 C) (Temporal)   Ht 5\' 10"  (1.778 m)   Wt 213 lb 1 oz (96.6 kg)   SpO2 98%   BMI 30.57 kg/m    CC: ER f/u visit  Subjective:    Patient ID: Jesse [name], male    DOB: Jan 08, 1946, 74 y.o.   MRN: [id number]  HPI: Jesse [name] is a 74 y.o. male presenting on 03/15/2020 for Hospitalization Follow-up (Seen on 03/04/20 at [facility] ED.  Pt accompanied by wife, Becky- temp 98.1.)   I last saw patient 06/2018.  Recent ER visit after falls at home with new onset vertigo and ataxia - records reviewed. Positive skew with bilateral nystagmus on neuro testing. Labwork largely ok, GFR 46, UA ok. CTA head/neck negative for stroke, showing cervical and intracranial ATH with up to 50% stenosis at dominant L vertebral origin. Brain MRI negative for stroke, showing amyloid angiopathy and moderate chronic small vessel ischemia.   Slurring speech, doesn't remember fall.  No alcohol intake that night.  Notes worsening balance over the past 2-3 yrs. Denies paresthesias.   Has had other falls/near falls.   Upcoming cardiac catheterization 03/20/2020 Upcoming neurology appt 05/23/2020  MMSE - 28/30. Missed 2 orientation questions.      Relevant past medical, surgical, family and social history reviewed and updated as indicated. Interim medical history since our last visit reviewed. Allergies and medications reviewed and updated. Outpatient Medications Prior to Visit  Medication Sig Dispense Refill  . amLODipine (NORVASC) 5 MG tablet Take 1 tablet (5 mg total) by mouth daily. 90 tablet 3  . aspirin 81 MG chewable tablet Chew 1 tablet (81 mg total) by mouth daily.    Marland Kitchen atorvastatin (LIPITOR) 80 MG tablet Take 1 tablet (80 mg total) by mouth daily. 90 tablet 3  . carvedilol (COREG) 12.5 MG tablet Take 1 tablet (12.5 mg total) by mouth 2 (two) times daily.  180 tablet 3  . Cholecalciferol (VITAMIN D3) 1000 units CAPS Take 1 capsule (1,000 Units total) by mouth daily. 30 capsule   . glucose blood test strip Use to test sugar daily AS NEEDED 30 each 3  . vitamin B-12 (CYANOCOBALAMIN) 1000 MCG tablet Take 1,000 mcg by mouth daily.    . nitroGLYCERIN (NITROSTAT) 0.4 MG SL tablet Place 1 tablet (0.4 mg total) under the tongue every 5 (five) minutes as needed for chest pain. 25 tablet 11   Facility-Administered Medications Prior to Visit  Medication Dose Route Frequency Provider Last Rate Last Admin  . 0.9 %  sodium chloride infusion  500 mL Intravenous Once Armbruster, Carlota Raspberry, MD      . sodium chloride flush (NS) 0.9 % injection 3 mL  3 mL Intravenous Q12H [name], [name], MD         Per HPI unless specifically indicated in ROS section below Review of Systems Objective:  BP 116/64 (BP Location: Left Arm, Patient Position: Sitting, Cuff Size: Large)   Pulse 68   Temp (!) 97.5 F (36.4 C) (Temporal)   Ht 5\' 10"  (1.778 m)   Wt 213 lb 1 oz (96.6 kg)   SpO2 98%   BMI 30.57 kg/m   Wt Readings from Last 3 Encounters:  03/15/20 213 lb 1 oz (96.6 kg)  02/21/20 215 lb (97.5 kg)  08/17/19 224 lb 9.6 oz (101.9  kg)      Physical Exam Vitals and nursing note reviewed.  Constitutional:      Appearance: Normal appearance. He is not ill-appearing.  Neck:     Vascular: No carotid bruit.  Cardiovascular:     Rate and Rhythm: Normal rate and regular rhythm.     Pulses: Normal pulses.     Heart sounds: Murmur (4/6 systolic throughout) heard.   Pulmonary:     Effort: Pulmonary effort is normal. No respiratory distress.     Breath sounds: Normal breath sounds. No wheezing, rhonchi or rales.  Musculoskeletal:     Right lower leg: Edema (tr) present.     Left lower leg: Edema (tr) present.  Neurological:     Mental Status: He is alert.  Psychiatric:        Mood and Affect: Mood normal.        Behavior: Behavior normal.       Results for  orders placed or performed during the hospital encounter of 03/04/20  Protime-INR  Result Value Ref Range   Prothrombin Time 12.5 11.4 - 15.2 seconds   INR 1.0 0.8 - 1.2  APTT  Result Value Ref Range   aPTT 31 24 - 36 seconds  CBC  Result Value Ref Range   WBC 7.4 4.0 - 10.5 K/uL   RBC 3.90 (L) 4.22 - 5.81 MIL/uL   Hemoglobin 13.4 13.0 - 17.0 g/dL   HCT 39.3 39 - 52 %   MCV 100.8 (H) 80.0 - 100.0 fL   MCH 34.4 (H) 26.0 - 34.0 pg   MCHC 34.1 30.0 - 36.0 g/dL   RDW 13.8 11.5 - 15.5 %   Platelets 223 150 - 400 K/uL   nRBC 0.0 0.0 - 0.2 %  Differential  Result Value Ref Range   Neutrophils Relative % 62 %   Neutro Abs 4.6 1.7 - 7.7 K/uL   Lymphocytes Relative 29 %   Lymphs Abs 2.2 0.7 - 4.0 K/uL   Monocytes Relative 5 %   Monocytes Absolute 0.4 0.1 - 1.0 K/uL   Eosinophils Relative 2 %   Eosinophils Absolute 0.1 0.0 - 0.5 K/uL   Basophils Relative 1 %   Basophils Absolute 0.1 0.0 - 0.1 K/uL   Immature Granulocytes 1 %   Abs Immature Granulocytes 0.04 0.00 - 0.07 K/uL  Comprehensive metabolic panel  Result Value Ref Range   Sodium 146 (H) 135 - 145 mmol/L   Potassium 3.2 (L) 3.5 - 5.1 mmol/L   Chloride 110 98 - 111 mmol/L   CO2 24 22 - 32 mmol/L   Glucose, Bld 99 70 - 99 mg/dL   BUN 15 8 - 23 mg/dL   Creatinine, Ser 1.57 (H) 0.61 - 1.24 mg/dL   Calcium 9.2 8.9 - 10.3 mg/dL   Total Protein 7.0 6.5 - 8.1 g/dL   Albumin 4.1 3.5 - 5.0 g/dL   AST 29 15 - 41 U/L   ALT 30 0 - 44 U/L   Alkaline Phosphatase 64 38 - 126 U/L   Total Bilirubin 0.8 0.3 - 1.2 mg/dL   GFR, Estimated 46 (L) >60 mL/min   Anion gap 12 5 - 15  Urine rapid drug screen (hosp performed)  Result Value Ref Range   Opiates NONE DETECTED NONE DETECTED   Cocaine NONE DETECTED NONE DETECTED   Benzodiazepines NONE DETECTED NONE DETECTED   Amphetamines NONE DETECTED NONE DETECTED   Tetrahydrocannabinol NONE DETECTED NONE DETECTED   Barbiturates NONE DETECTED NONE DETECTED  Urinalysis, Routine w reflex  microscopic Urine, Clean Catch  Result Value Ref Range   Color, Urine YELLOW YELLOW   APPearance CLEAR CLEAR   Specific Gravity, Urine 1.006 1.005 - 1.030   pH 5.0 5.0 - 8.0   Glucose, UA NEGATIVE NEGATIVE mg/dL   Hgb urine dipstick SMALL (A) NEGATIVE   Bilirubin Urine NEGATIVE NEGATIVE   Ketones, ur NEGATIVE NEGATIVE mg/dL   Protein, ur NEGATIVE NEGATIVE mg/dL   Nitrite NEGATIVE NEGATIVE   Leukocytes,Ua NEGATIVE NEGATIVE   RBC / HPF 0-5 0 - 5 RBC/hpf   WBC, UA 0-5 0 - 5 WBC/hpf   Bacteria, UA RARE (A) NONE SEEN   Mucus PRESENT   I-stat chem 8, ED  Result Value Ref Range   Sodium 148 (H) 135 - 145 mmol/L   Potassium 3.2 (L) 3.5 - 5.1 mmol/L   Chloride 108 98 - 111 mmol/L   BUN 16 8 - 23 mg/dL   Creatinine, Ser 1.90 (H) 0.61 - 1.24 mg/dL   Glucose, Bld 96 70 - 99 mg/dL   Calcium, Ion 1.15 1.15 - 1.40 mmol/L   TCO2 23 22 - 32 mmol/L   Hemoglobin 13.3 13.0 - 17.0 g/dL   HCT 39.0 39 - 52 %   Lab Results  Component Value Date   VITAMINB12 631 09/10/2017    Assessment & Plan:  This visit occurred during the SARS-CoV-2 public health emergency.  Safety protocols were in place, including screening questions prior to the visit, additional usage of staff PPE, and extensive cleaning of exam room while observing appropriate contact time as indicated for disinfecting solutions.   Problem List Items Addressed This Visit    MVP (mitral valve prolapse)    Upcoming cardiac cath then planned cards f/u.       Imbalance - Primary    Ongoing. Denies periph neuropathy. Discussed outpatient PT referral - he agrees but once he has completed pending heart evaluation. PT referral placed today.  With mild macrocytosis and h/o alcohol intake, could be vit deficiency - rec restart B12 (had not been taking regularly).  Consider further eval for vitamin deficiency.       Relevant Orders   Ambulatory referral to Physical Therapy   Fall with injury    ER records reviewed with patient and wife.        Relevant Orders   Ambulatory referral to Physical Therapy   Cerebral amyloid angiopathy ([place])    Noted on recent MRI. Reviewed with patient as well as increased risk of dementia and brain bleed. Continue good BP and chol control, keep upcoming neuro appt.  MMSE today 28/30 (missed 2 orientation questions)      CAD (coronary artery disease)    Regularly followed by cardiology.           No orders of the defined types were placed in this encounter.  Orders Placed This Encounter  Procedures  . Ambulatory referral to Physical Therapy    Referral Priority:   Routine    Referral Type:   Physical Medicine    Referral Reason:   Specialty Services Required    Requested Specialty:   Physical Therapy    Number of Visits Requested:   1    Patient Instructions  I recommend seeing the physical therapist after your heart catheterization for a balance training/fall prevention program. I have placed a referral for you - to be scheduled after your heart evaluation.  MRI showed amyloid angiopathy or plaque buildup in brain arteries  which can increase risk of memory troubles and bleeding.  Keep neurology appointment for January.  Memory testing looked overall ok today.   Follow up plan: Return if symptoms worsen or fail to improve.  Ria Bush, MD

## 2020-03-15 NOTE — Patient Instructions (Addendum)
I recommend seeing the physical therapist after your heart catheterization for a balance training/fall prevention program. I have placed a referral for you - to be scheduled after your heart evaluation.  MRI showed amyloid angiopathy or plaque buildup in brain arteries which can increase risk of memory troubles and bleeding.  Keep neurology appointment for January.  Memory testing looked overall ok today.

## 2020-03-15 NOTE — Assessment & Plan Note (Addendum)
Ongoing. Denies periph neuropathy. Discussed outpatient PT referral - he agrees but once he has completed pending heart evaluation. PT referral placed today.  With mild macrocytosis and h/o alcohol intake, could be vit deficiency - rec restart B12 (had not been taking regularly).  Consider further eval for vitamin deficiency.

## 2020-03-15 NOTE — Assessment & Plan Note (Signed)
Regularly followed by cardiology.

## 2020-03-15 NOTE — Assessment & Plan Note (Signed)
Upcoming cardiac cath then planned cards f/u.

## 2020-03-15 NOTE — Assessment & Plan Note (Signed)
ER records reviewed with patient and wife.

## 2020-03-16 ENCOUNTER — Telehealth: Payer: Self-pay | Admitting: *Deleted

## 2020-03-16 NOTE — Telephone Encounter (Signed)
Call to patient's wife (DPR), [name], to review procedure instructions, no answer.

## 2020-03-16 NOTE — Telephone Encounter (Signed)
Pt contacted pre-catheterization scheduled at [facility] for: Monday March 20, 2020 10:30 AM Verified arrival time and place: [facility]) at: 6:30 AM-pre-procedure hydration   No solid food after midnight prior to cath, clear liquids until 5 AM day of procedure.   AM meds can be  taken pre-cath with sips of water including: ASA 81 mg   Confirmed patient has responsible adult to drive home post procedure and be with patient first 24 hours after arriving home:  You are allowed ONE visitor in the waiting room during the time you are at the hospital for your procedure. Both you and your visitor must wear a mask once you enter the hospital.       COVID-19 Pre-Screening Questions:   In the past 14 days have you had a new cough, new headache, new nasal congestion, fever (100.4 or greater) unexplained body aches, new sore throat, or sudden loss of taste or sense of smell?  In the past 14 days have you been around anyone with known Covid 19?     LMTCB to review procedure instructions with patient

## 2020-03-16 NOTE — Telephone Encounter (Signed)
Voicemail message, no answer. 

## 2020-03-16 NOTE — Telephone Encounter (Signed)
No answer, voicemail message. 

## 2020-03-17 ENCOUNTER — Other Ambulatory Visit (HOSPITAL_COMMUNITY)
Admission: RE | Admit: 2020-03-17 | Discharge: 2020-03-17 | Disposition: A | Discharge status: Home / Self Care | Payer: PPO | Source: Ambulatory Visit | Attending: Cardiology | Admitting: Cardiology

## 2020-03-17 DIAGNOSIS — Z20822 Contact with and (suspected) exposure to covid-19: Secondary | ICD-10-CM | POA: Insufficient documentation

## 2020-03-17 DIAGNOSIS — Z01812 Encounter for preprocedural laboratory examination: Secondary | ICD-10-CM | POA: Insufficient documentation

## 2020-03-17 LAB — SARS CORONAVIRUS 2 (TAT 6-24 HRS): SARS Coronavirus 2: NEGATIVE

## 2020-03-20 ENCOUNTER — Encounter (HOSPITAL_COMMUNITY)
Admission: RE | Disposition: A | Discharge status: Home / Self Care | Payer: Self-pay | Source: Home / Self Care | Attending: Cardiology

## 2020-03-20 ENCOUNTER — Other Ambulatory Visit: Payer: Self-pay

## 2020-03-20 ENCOUNTER — Ambulatory Visit (HOSPITAL_COMMUNITY)
Admission: RE | Admit: 2020-03-20 | Discharge: 2020-03-20 | Disposition: A | Discharge status: Home / Self Care | Payer: PPO | Attending: Cardiology | Admitting: Cardiology

## 2020-03-20 DIAGNOSIS — Z6830 Body mass index (BMI) 30.0-30.9, adult: Secondary | ICD-10-CM | POA: Insufficient documentation

## 2020-03-20 DIAGNOSIS — I340 Nonrheumatic mitral (valve) insufficiency: Secondary | ICD-10-CM

## 2020-03-20 DIAGNOSIS — Z79899 Other long term (current) drug therapy: Secondary | ICD-10-CM | POA: Diagnosis not present

## 2020-03-20 DIAGNOSIS — I341 Nonrheumatic mitral (valve) prolapse: Secondary | ICD-10-CM | POA: Diagnosis not present

## 2020-03-20 DIAGNOSIS — Z955 Presence of coronary angioplasty implant and graft: Secondary | ICD-10-CM | POA: Diagnosis not present

## 2020-03-20 DIAGNOSIS — I680 Cerebral amyloid angiopathy: Secondary | ICD-10-CM | POA: Diagnosis present

## 2020-03-20 DIAGNOSIS — I2510 Atherosclerotic heart disease of native coronary artery without angina pectoris: Secondary | ICD-10-CM | POA: Diagnosis not present

## 2020-03-20 DIAGNOSIS — E669 Obesity, unspecified: Secondary | ICD-10-CM | POA: Diagnosis not present

## 2020-03-20 DIAGNOSIS — Z7982 Long term (current) use of aspirin: Secondary | ICD-10-CM | POA: Diagnosis not present

## 2020-03-20 DIAGNOSIS — I272 Pulmonary hypertension, unspecified: Secondary | ICD-10-CM | POA: Insufficient documentation

## 2020-03-20 DIAGNOSIS — N183 Chronic kidney disease, stage 3 unspecified: Secondary | ICD-10-CM | POA: Diagnosis present

## 2020-03-20 DIAGNOSIS — E854 Organ-limited amyloidosis: Secondary | ICD-10-CM | POA: Diagnosis present

## 2020-03-20 DIAGNOSIS — G4733 Obstructive sleep apnea (adult) (pediatric): Secondary | ICD-10-CM | POA: Diagnosis present

## 2020-03-20 DIAGNOSIS — I130 Hypertensive heart and chronic kidney disease with heart failure and stage 1 through stage 4 chronic kidney disease, or unspecified chronic kidney disease: Secondary | ICD-10-CM | POA: Insufficient documentation

## 2020-03-20 DIAGNOSIS — E785 Hyperlipidemia, unspecified: Secondary | ICD-10-CM | POA: Diagnosis present

## 2020-03-20 DIAGNOSIS — Z8249 Family history of ischemic heart disease and other diseases of the circulatory system: Secondary | ICD-10-CM | POA: Diagnosis not present

## 2020-03-20 DIAGNOSIS — Z87891 Personal history of nicotine dependence: Secondary | ICD-10-CM | POA: Insufficient documentation

## 2020-03-20 DIAGNOSIS — I10 Essential (primary) hypertension: Secondary | ICD-10-CM | POA: Diagnosis present

## 2020-03-20 HISTORY — PX: RIGHT/LEFT HEART CATH AND CORONARY ANGIOGRAPHY: CATH118266

## 2020-03-20 HISTORY — DX: Nonrheumatic mitral (valve) insufficiency: I34.0

## 2020-03-20 LAB — POCT I-STAT 7, (LYTES, BLD GAS, ICA,H+H)
Acid-Base Excess: 1 mmol/L (ref 0.0–2.0)
Bicarbonate: 25.4 mmol/L (ref 20.0–28.0)
Calcium, Ion: 1.25 mmol/L (ref 1.15–1.40)
HCT: 35 % — ABNORMAL LOW (ref 39.0–52.0)
Hemoglobin: 11.9 g/dL — ABNORMAL LOW (ref 13.0–17.0)
O2 Saturation: 99 %
Potassium: 3.6 mmol/L (ref 3.5–5.1)
Sodium: 142 mmol/L (ref 135–145)
TCO2: 27 mmol/L (ref 22–32)
pCO2 arterial: 39.5 mmHg (ref 32.0–48.0)
pH, Arterial: 7.417 (ref 7.350–7.450)
pO2, Arterial: 163 mmHg — ABNORMAL HIGH (ref 83.0–108.0)

## 2020-03-20 LAB — POCT I-STAT EG7
Acid-Base Excess: 1 mmol/L (ref 0.0–2.0)
Bicarbonate: 26.1 mmol/L (ref 20.0–28.0)
Calcium, Ion: 1.25 mmol/L (ref 1.15–1.40)
HCT: 35 % — ABNORMAL LOW (ref 39.0–52.0)
Hemoglobin: 11.9 g/dL — ABNORMAL LOW (ref 13.0–17.0)
O2 Saturation: 78 %
Potassium: 3.6 mmol/L (ref 3.5–5.1)
Sodium: 143 mmol/L (ref 135–145)
TCO2: 27 mmol/L (ref 22–32)
pCO2, Ven: 41.9 mmHg — ABNORMAL LOW (ref 44.0–60.0)
pH, Ven: 7.402 (ref 7.250–7.430)
pO2, Ven: 42 mmHg (ref 32.0–45.0)

## 2020-03-20 SURGERY — RIGHT/LEFT HEART CATH AND CORONARY ANGIOGRAPHY
Anesthesia: LOCAL

## 2020-03-20 MED ORDER — VERAPAMIL HCL 2.5 MG/ML IV SOLN
INTRAVENOUS | Status: AC
  Filled 2020-03-20: qty 2

## 2020-03-20 MED ORDER — HEPARIN SODIUM (PORCINE) 1000 UNIT/ML IJ SOLN
INTRAMUSCULAR | Status: DC | PRN
  Administered 2020-03-20: 5000 [IU] via INTRAVENOUS

## 2020-03-20 MED ORDER — SODIUM CHLORIDE 0.9 % WEIGHT BASED INFUSION
3.0000 mL/kg/h | INTRAVENOUS | Status: AC
  Administered 2020-03-20: 3 mL/kg/h via INTRAVENOUS

## 2020-03-20 MED ORDER — ASPIRIN 81 MG PO CHEW: 81.0000 mg | CHEWABLE_TABLET | ORAL | Status: DC

## 2020-03-20 MED ORDER — MIDAZOLAM HCL 2 MG/2ML IJ SOLN
INTRAMUSCULAR | Status: DC | PRN
  Administered 2020-03-20: 1 mg via INTRAVENOUS

## 2020-03-20 MED ORDER — SODIUM CHLORIDE 0.9 % IV SOLN: 250.0000 mL | INTRAVENOUS | Status: DC | PRN

## 2020-03-20 MED ORDER — ONDANSETRON HCL 4 MG/2ML IJ SOLN: 4.0000 mg | Freq: Four times a day (QID) | INTRAMUSCULAR | Status: DC | PRN

## 2020-03-20 MED ORDER — HEPARIN SODIUM (PORCINE) 1000 UNIT/ML IJ SOLN
INTRAMUSCULAR | Status: AC
  Filled 2020-03-20: qty 1

## 2020-03-20 MED ORDER — HEPARIN (PORCINE) IN NACL 1000-0.9 UT/500ML-% IV SOLN
INTRAVENOUS | Status: AC
  Filled 2020-03-20: qty 1000

## 2020-03-20 MED ORDER — FENTANYL CITRATE (PF) 100 MCG/2ML IJ SOLN
INTRAMUSCULAR | Status: AC
  Filled 2020-03-20: qty 2

## 2020-03-20 MED ORDER — SODIUM CHLORIDE 0.9 % WEIGHT BASED INFUSION: 1.0000 mL/kg/h | INTRAVENOUS | Status: DC

## 2020-03-20 MED ORDER — FENTANYL CITRATE (PF) 100 MCG/2ML IJ SOLN
INTRAMUSCULAR | Status: DC | PRN
  Administered 2020-03-20: 25 ug via INTRAVENOUS

## 2020-03-20 MED ORDER — VERAPAMIL HCL 2.5 MG/ML IV SOLN
INTRAVENOUS | Status: DC | PRN
  Administered 2020-03-20: 10 mL via INTRA_ARTERIAL

## 2020-03-20 MED ORDER — LIDOCAINE HCL (PF) 1 % IJ SOLN
INTRAMUSCULAR | Status: DC | PRN
  Administered 2020-03-20 (×2): 2 mL

## 2020-03-20 MED ORDER — HEPARIN (PORCINE) IN NACL 1000-0.9 UT/500ML-% IV SOLN
INTRAVENOUS | Status: DC | PRN
  Administered 2020-03-20 (×3): 500 mL

## 2020-03-20 MED ORDER — SODIUM CHLORIDE 0.9% FLUSH: 3.0000 mL | INTRAVENOUS | Status: DC | PRN

## 2020-03-20 MED ORDER — ACETAMINOPHEN 325 MG PO TABS: 650.0000 mg | ORAL_TABLET | ORAL | Status: DC | PRN

## 2020-03-20 MED ORDER — IOHEXOL 350 MG/ML SOLN
INTRAVENOUS | Status: DC | PRN
  Administered 2020-03-20: 60 mL via INTRA_ARTERIAL

## 2020-03-20 MED ORDER — MIDAZOLAM HCL 2 MG/2ML IJ SOLN
INTRAMUSCULAR | Status: AC
  Filled 2020-03-20: qty 2

## 2020-03-20 MED ORDER — LIDOCAINE HCL (PF) 1 % IJ SOLN
INTRAMUSCULAR | Status: AC
  Filled 2020-03-20: qty 30

## 2020-03-20 MED ORDER — SODIUM CHLORIDE 0.9% FLUSH: 3.0000 mL | Freq: Two times a day (BID) | INTRAVENOUS | Status: DC

## 2020-03-20 SURGICAL SUPPLY — 13 items
CATH 5FR JL3.5 JR4 ANG PIG MP (CATHETERS) ×2 IMPLANT
CATH SWAN GANZ 7F STRAIGHT (CATHETERS) ×2 IMPLANT
DEVICE RAD COMP TR BAND LRG (VASCULAR PRODUCTS) ×2 IMPLANT
GLIDESHEATH SLEND SS 6F .021 (SHEATH) ×2 IMPLANT
GLIDESHEATH SLENDER 7FR .021G (SHEATH) ×2 IMPLANT
GUIDEWIRE .025 260CM (WIRE) ×2 IMPLANT
GUIDEWIRE INQWIRE 1.5J.035X260 (WIRE) ×1 IMPLANT
INQWIRE 1.5J .035X260CM (WIRE) ×2
KIT HEART LEFT (KITS) ×2 IMPLANT
PACK CARDIAC CATHETERIZATION (CUSTOM PROCEDURE TRAY) ×2 IMPLANT
TRANSDUCER W/STOPCOCK (MISCELLANEOUS) ×2 IMPLANT
TUBING CIL FLEX 10 FLL-RA (TUBING) ×2 IMPLANT
WIRE EMERALD 3MM-J .025X260CM (WIRE) ×2 IMPLANT

## 2020-03-20 NOTE — Telephone Encounter (Signed)
R/LHC done 03/20/20

## 2020-03-20 NOTE — Interval H&P Note (Signed)
History and Physical Interval Note:  03/20/2020 9:41 AM  Jesse [name]  has presented today for surgery, with the diagnosis of CAD mitral regurgitation.  The various methods of treatment have been discussed with the patient and family. After consideration of risks, benefits and other options for treatment, the patient has consented to  Procedure(s): RIGHT/LEFT HEART CATH AND CORONARY ANGIOGRAPHY (N/A) as a surgical intervention.  The patient's history has been reviewed, patient examined, no change in status, stable for surgery.  I have reviewed the patient's chart and labs.  Questions were answered to the patient's satisfaction.   Cath Lab Visit (complete for each Cath Lab visit)  Clinical Evaluation Leading to the Procedure:   ACS: No.  Non-ACS:    Anginal Classification: CCS I  Anti-ischemic medical therapy: Maximal Therapy (2 or more classes of medications)  Non-Invasive Test Results: No non-invasive testing performed  Prior CABG: No previous CABG        Collier Sal[facility] 03/20/2020 9:42 AM

## 2020-03-20 NOTE — Discharge Instructions (Signed)
Drink plenty of fluid for 48 hours and keep wrist elevated at heart level for 24 hours  Radial Site Care   This sheet gives you information about how to care for yourself after your procedure. Your health care provider may also give you more specific instructions. If you have problems or questions, contact your health care provider. What can I expect after the procedure? After the procedure, it is common to have:  Bruising and tenderness at the catheter insertion area. Follow these instructions at home: Medicines  Take over-the-counter and prescription medicines only as told by your health care provider. Insertion site care 1. Follow instructions from your health care provider about how to take care of your insertion site. Make sure you: ? Wash your hands with soap and water before you change your bandage (dressing). If soap and water are not available, use hand sanitizer. ? remove your dressing as told by your health care provider. In 24 hours 2. Check your insertion site every day for signs of infection. Check for: ? Redness, swelling, or pain. ? Fluid or blood. ? Pus or a bad smell. ? Warmth. 3. Do not take baths, swim, or use a hot tub until your health care provider approves. 4. You may shower 24-48 hours after the procedure, or as directed by your health care provider. ? Remove the dressing and gently wash the site with plain soap and water. ? Pat the area dry with a clean towel. ? Do not rub the site. That could cause bleeding. 5. Do not apply powder or lotion to the site. Activity   1. For 24 hours after the procedure, or as directed by your health care provider: ? Do not flex or bend the affected arm. ? Do not push or pull heavy objects with the affected arm. ? Do not drive yourself home from the hospital or clinic. You may drive 24 hours after the procedure unless your health care provider tells you not to. ? Do not operate machinery or power tools. 2. Do not lift  anything that is heavier than 10 lb (4.5 kg), or the limit that you are told, until your health care provider says that it is safe. For 4 days 3. Ask your health care provider when it is okay to: ? Return to work or school. ? Resume usual physical activities or sports. ? Resume sexual activity. General instructions  If the catheter site starts to bleed, raise your arm and put firm pressure on the site. If the bleeding does not stop, get help right away. This is a medical emergency.  If you went home on the same day as your procedure, a responsible adult should be with you for the first 24 hours after you arrive home.  Keep all follow-up visits as told by your health care provider. This is important. Contact a health care provider if:  You have a fever.  You have redness, swelling, or yellow drainage around your insertion site. Get help right away if:  You have unusual pain at the radial site.  The catheter insertion area swells very fast.  The insertion area is bleeding, and the bleeding does not stop when you hold steady pressure on the area.  Your arm or hand becomes pale, cool, tingly, or numb. These symptoms may represent a serious problem that is an emergency. Do not wait to see if the symptoms will go away. Get medical help right away. Call your local emergency services (911 in the [place] Do not   drive yourself to the hospital. Summary  After the procedure, it is common to have bruising and tenderness at the site.  Follow instructions from your health care provider about how to take care of your radial site wound. Check the wound every day for signs of infection.  Do not lift anything that is heavier than 10 lb (4.5 kg), or the limit that you are told, until your health care provider says that it is safe. This information is not intended to replace advice given to you by your health care provider. Make sure you discuss any questions you have with your health care  provider. Document Revised: 06/04/2017 Document Reviewed: 06/04/2017 Elsevier Patient Education  2020 [employer]  

## 2020-03-20 NOTE — Progress Notes (Signed)
Patient was given discharge instructions. He verbalized understanding. 

## 2020-03-21 ENCOUNTER — Encounter (HOSPITAL_COMMUNITY): Payer: Self-pay | Admitting: Cardiology
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
## 2020-04-05 ENCOUNTER — Encounter: Payer: Self-pay | Admitting: Cardiology

## 2020-04-05 ENCOUNTER — Ambulatory Visit: Payer: PPO | Admitting: Cardiology

## 2020-04-05 ENCOUNTER — Other Ambulatory Visit: Payer: Self-pay

## 2020-04-05 VITALS — BP 148/76 | HR 73 | Ht 70.0 in | Wt 211.0 lb

## 2020-04-05 DIAGNOSIS — E854 Organ-limited amyloidosis: Secondary | ICD-10-CM

## 2020-04-05 DIAGNOSIS — N1831 Chronic kidney disease, stage 3a: Secondary | ICD-10-CM

## 2020-04-05 DIAGNOSIS — I341 Nonrheumatic mitral (valve) prolapse: Secondary | ICD-10-CM | POA: Diagnosis not present

## 2020-04-05 DIAGNOSIS — I340 Nonrheumatic mitral (valve) insufficiency: Secondary | ICD-10-CM | POA: Diagnosis not present

## 2020-04-05 DIAGNOSIS — I2510 Atherosclerotic heart disease of native coronary artery without angina pectoris: Secondary | ICD-10-CM

## 2020-04-05 DIAGNOSIS — E7800 Pure hypercholesterolemia, unspecified: Secondary | ICD-10-CM | POA: Diagnosis not present

## 2020-04-05 DIAGNOSIS — I680 Cerebral amyloid angiopathy: Secondary | ICD-10-CM

## 2020-05-23 ENCOUNTER — Ambulatory Visit: Payer: PPO | Admitting: Diagnostic Neuroimaging

## 2020-05-23 ENCOUNTER — Encounter: Payer: Self-pay | Admitting: Diagnostic Neuroimaging

## 2020-05-23 ENCOUNTER — Other Ambulatory Visit: Payer: Self-pay

## 2020-05-23 VITALS — BP 124/82 | HR 62 | Ht 70.0 in | Wt 219.0 lb

## 2020-05-23 DIAGNOSIS — S060X0S Concussion without loss of consciousness, sequela: Secondary | ICD-10-CM

## 2020-05-23 DIAGNOSIS — R269 Unspecified abnormalities of gait and mobility: Secondary | ICD-10-CM
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
## 2020-06-02 ENCOUNTER — Encounter: Payer: Self-pay | Admitting: Cardiology

## 2020-06-02 NOTE — Telephone Encounter (Signed)
error 
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
## 2020-06-06 ENCOUNTER — Encounter: Payer: Self-pay | Admitting: Primary Care

## 2020-06-06 ENCOUNTER — Other Ambulatory Visit (INDEPENDENT_AMBULATORY_CARE_PROVIDER_SITE_OTHER): Payer: PPO

## 2020-06-06 ENCOUNTER — Telehealth (INDEPENDENT_AMBULATORY_CARE_PROVIDER_SITE_OTHER): Payer: PPO | Admitting: Primary Care

## 2020-06-06 VITALS — Ht 70.0 in | Wt 219.0 lb

## 2020-06-06 DIAGNOSIS — R059 Cough, unspecified: Secondary | ICD-10-CM

## 2020-06-06 DIAGNOSIS — Z20822 Contact with and (suspected) exposure to covid-19: Secondary | ICD-10-CM | POA: Diagnosis not present

## 2020-06-06 NOTE — Patient Instructions (Signed)
Come by this afternoon for Covid testing.  Notify your cardiologist's office regarding your symptoms.  It was a pleasure meeting you! [name], NP-C
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
## 2020-06-06 NOTE — Assessment & Plan Note (Signed)
Acute viral symptoms x 4-5 days, likely Covid-19 given his wife's positive result last week.  He appears well, no cough or symptoms during visit. Discussed CDC recommendations for isolation. Recommended he notify his cardiologists office regarding his symptoms. We will test him this afternoon.

## 2020-06-07 ENCOUNTER — Other Ambulatory Visit: Payer: Self-pay

## 2020-06-07 ENCOUNTER — Encounter: Payer: Self-pay | Admitting: Cardiology

## 2020-06-07 ENCOUNTER — Ambulatory Visit (INDEPENDENT_AMBULATORY_CARE_PROVIDER_SITE_OTHER): Payer: PPO | Admitting: Cardiology

## 2020-06-07 ENCOUNTER — Other Ambulatory Visit: Payer: Self-pay | Admitting: Cardiology

## 2020-06-07 VITALS — BP 150/80 | HR 75 | Ht 70.0 in | Wt 216.4 lb

## 2020-06-07 DIAGNOSIS — I340 Nonrheumatic mitral (valve) insufficiency: Secondary | ICD-10-CM

## 2020-06-07 DIAGNOSIS — E854 Organ-limited amyloidosis: Secondary | ICD-10-CM | POA: Diagnosis not present

## 2020-06-07 DIAGNOSIS — I2510 Atherosclerotic heart disease of native coronary artery without angina pectoris: Secondary | ICD-10-CM

## 2020-06-07 DIAGNOSIS — N1831 Chronic kidney disease, stage 3a: Secondary | ICD-10-CM

## 2020-06-07 DIAGNOSIS — I341 Nonrheumatic mitral (valve) prolapse: Secondary | ICD-10-CM

## 2020-06-07 DIAGNOSIS — I680 Cerebral amyloid angiopathy: Secondary | ICD-10-CM

## 2020-06-07 NOTE — Patient Instructions (Addendum)
Medication Instructions:  Continue same medications *If you need a refill on your cardiac medications before your next appointment, please call your pharmacy*   Lab Work: Covid test 3 days before TEE    Testing/Procedures: TEE   Follow instructions below   Follow-Up: At [facility] you and your health needs are our priority.  As part of our continuing mission to provide you with exceptional heart care, we have created designated Provider Care Teams.  These Care Teams include your primary Cardiologist (physician) and Advanced Practice Providers (APPs -  Physician Assistants and Nurse Practitioners) who all work together to provide you with the care you need, when you need it.  We recommend signing up for the patient portal called "MyChart".  Sign up information is provided on this After Visit Summary.  MyChart is used to connect with patients for Virtual Visits (Telemedicine).  Patients are able to view lab/test results, encounter notes, upcoming appointments, etc.  Non-urgent messages can be sent to your provider as well.   To learn more about what you can do with MyChart, go to [email or web address].    Your next appointment:  Monday  09/15/20 at 11:00 am   The format for your next appointment: Office   Provider:  Dr.Jordan       You are scheduled for a TEE on Tuesday 06/27/20 with Dr.Amboy.  Please arrive at the [facility] (Main Entrance A) at [facility]: [address] [place], [place] [address] at 9:00 am.  DIET: Nothing to eat or drink after midnight except a sip of water with medications      Labs: CBC,BMET  Already done 06/07/20.   Covid test [address] Thru Site  Friday 06/23/20 at 1:20 pm.Quarantine until after test.   You must have a responsible person to drive you home and stay in the waiting area during your procedure. Failure to do so could result in cancellation.  Bring your insurance cards.  *Special Note: Every effort is  made to have your procedure done on time. Occasionally there are emergencies that occur at the hospital that may cause delays. Please be patient if a delay does occur.

## 2020-06-08 LAB — BASIC METABOLIC PANEL
BUN/Creatinine Ratio: 13 (ref 10–24)
BUN: 18 mg/dL (ref 8–27)
CO2: 23 mmol/L (ref 20–29)
Calcium: 9.4 mg/dL (ref 8.6–10.2)
Chloride: 104 mmol/L (ref 96–106)
Creatinine, Ser: 1.35 mg/dL — ABNORMAL HIGH (ref 0.76–1.27)
GFR calc Af Amer: 59 mL/min/{1.73_m2} — ABNORMAL LOW (ref >59)
GFR calc non Af Amer: 51 mL/min/{1.73_m2} — ABNORMAL LOW (ref >59)
Glucose: 105 mg/dL — ABNORMAL HIGH (ref 65–99)
Potassium: 4.6 mmol/L (ref 3.5–5.2)
Sodium: 144 mmol/L (ref 134–144)

## 2020-06-08 LAB — CBC WITH DIFFERENTIAL/PLATELET
Basophils Absolute: 0.1 10*3/uL (ref 0.0–0.2)
Basos: 1 %
EOS (ABSOLUTE): 0.1 10*3/uL (ref 0.0–0.4)
Eos: 2 %
Hematocrit: 42.5 % (ref 37.5–51.0)
Hemoglobin: 14.7 g/dL (ref 13.0–17.7)
Immature Grans (Abs): 0 10*3/uL (ref 0.0–0.1)
Immature Granulocytes: 0 %
Lymphocytes Absolute: 1.8 10*3/uL (ref 0.7–3.1)
Lymphs: 27 %
MCH: 34 pg — ABNORMAL HIGH (ref 26.6–33.0)
MCHC: 34.6 g/dL (ref 31.5–35.7)
MCV: 98 fL — ABNORMAL HIGH (ref 79–97)
Monocytes Absolute: 0.5 10*3/uL (ref 0.1–0.9)
Monocytes: 8 %
Neutrophils Absolute: 4.1 10*3/uL (ref 1.4–7.0)
Neutrophils: 62 %
Platelets: 157 10*3/uL (ref 150–450)
RBC: 4.32 x10E6/uL (ref 4.14–5.80)
RDW: 12.9 % (ref 11.6–15.4)
WBC: 6.6 10*3/uL (ref 3.4–10.8)

## 2020-06-08 LAB — NOVEL CORONAVIRUS, NAA: SARS-CoV-2, NAA: DETECTED — AB

## 2020-06-08 LAB — SARS-COV-2, NAA 2 DAY TAT

## 2020-06-09 ENCOUNTER — Telehealth: Payer: Self-pay | Admitting: Infectious Diseases

## 2020-06-09 NOTE — Telephone Encounter (Signed)
Called to discuss with patient about COVID-19 symptoms and the use of one of the available treatments for those with mild to moderate Covid symptoms and at a high risk of hospitalization.  Pt appears to qualify for outpatient treatment due to co-morbid conditions and/or a member of an at-risk group in accordance with the FDA Emergency Use Authorization.    Symptom onset: undocumented  Vaccinated: Yes  Booster? no Immunocompromised? no Qualifiers: age, smoking history, cardiac history  Unable to reach pt - LVM and sent mychart to clarify symptoms and discuss possible treatments    Jesse [name]

## 2020-06-12 ENCOUNTER — Other Ambulatory Visit: Payer: Self-pay | Admitting: Cardiology

## 2020-06-12 ENCOUNTER — Other Ambulatory Visit (HOSPITAL_COMMUNITY): Payer: PPO

## 2020-06-12 DIAGNOSIS — I340 Nonrheumatic mitral (valve) insufficiency: Secondary | ICD-10-CM

## 2020-06-14 ENCOUNTER — Ambulatory Visit (HOSPITAL_COMMUNITY): Admission: RE | Admit: 2020-06-14 | Payer: PPO | Source: Home / Self Care | Admitting: Cardiovascular Disease

## 2020-06-14 ENCOUNTER — Encounter (HOSPITAL_COMMUNITY): Admission: RE | Payer: Self-pay | Source: Home / Self Care

## 2020-06-14 SURGERY — ECHOCARDIOGRAM, TRANSESOPHAGEAL
Anesthesia: Monitor Anesthesia Care

## 2020-06-22 ENCOUNTER — Telehealth: Payer: Self-pay

## 2020-06-22 ENCOUNTER — Telehealth: Payer: Self-pay | Admitting: Cardiology

## 2020-06-22 NOTE — Telephone Encounter (Signed)
Lm to call back ./cy 

## 2020-06-22 NOTE — Telephone Encounter (Signed)
Patient wanted to speak to [facility] specifically about an upcoming appointment

## 2020-06-22 NOTE — Telephone Encounter (Signed)
The Covid testing site called in and state that pt tested positive on 06/06/20 and does not need a covid test for 90 days,  The pt called them and gave them a hard time and told them that he had to have this test.  Lilia Pro from the covid site would like for [place] to cancel this appt and contact the pt and let him know that the test is not needed due to the hosp policy.

## 2020-06-23 ENCOUNTER — Other Ambulatory Visit (HOSPITAL_COMMUNITY): Payer: PPO

## 2020-06-23 NOTE — Telephone Encounter (Signed)
Patient stated he was told he did not have to be retested for covid if he does not have any symptoms.Advised that is correct.Stated he has never had covid symptoms but did test positive 06/06/20.He will keep TEE as scheduled 06/27/20 arrive at 9:00 am.

## 2020-06-23 NOTE — Telephone Encounter (Signed)
Spoke to patient he stated he is scheduled for a TEE 06/27/20.Stated someone call him from [facility] hospital today and cancelled covid test scheduled for today.Stated

## 2020-06-24 ENCOUNTER — Other Ambulatory Visit (HOSPITAL_COMMUNITY): Payer: PPO

## 2020-06-26 ENCOUNTER — Encounter (HOSPITAL_COMMUNITY): Payer: Self-pay | Admitting: Cardiovascular Disease

## 2020-06-27 ENCOUNTER — Ambulatory Visit (HOSPITAL_BASED_OUTPATIENT_CLINIC_OR_DEPARTMENT_OTHER)
Admission: RE | Admit: 2020-06-27 | Discharge: 2020-06-27 | Disposition: A | Discharge status: Home / Self Care | Payer: PPO | Source: Ambulatory Visit | Attending: Cardiology | Admitting: Cardiology

## 2020-06-27 ENCOUNTER — Ambulatory Visit (HOSPITAL_COMMUNITY): Payer: PPO | Admitting: Certified Registered"

## 2020-06-27 ENCOUNTER — Encounter (HOSPITAL_COMMUNITY): Payer: Self-pay | Admitting: Cardiovascular Disease

## 2020-06-27 ENCOUNTER — Other Ambulatory Visit: Payer: Self-pay

## 2020-06-27 ENCOUNTER — Ambulatory Visit (HOSPITAL_COMMUNITY)
Admission: RE | Admit: 2020-06-27 | Discharge: 2020-06-27 | Disposition: A | Discharge status: Home / Self Care | Payer: PPO | Attending: Cardiovascular Disease | Admitting: Cardiovascular Disease

## 2020-06-27 ENCOUNTER — Encounter (HOSPITAL_COMMUNITY)
Admission: RE | Disposition: A | Discharge status: Home / Self Care | Payer: Self-pay | Source: Home / Self Care | Attending: Cardiovascular Disease

## 2020-06-27 DIAGNOSIS — E669 Obesity, unspecified: Secondary | ICD-10-CM | POA: Insufficient documentation

## 2020-06-27 DIAGNOSIS — I700 Atherosclerosis of aorta: Secondary | ICD-10-CM | POA: Diagnosis not present

## 2020-06-27 DIAGNOSIS — Z6831 Body mass index (BMI) 31.0-31.9, adult: Secondary | ICD-10-CM | POA: Diagnosis not present

## 2020-06-27 DIAGNOSIS — Z7982 Long term (current) use of aspirin: Secondary | ICD-10-CM | POA: Insufficient documentation

## 2020-06-27 DIAGNOSIS — I340 Nonrheumatic mitral (valve) insufficiency: Secondary | ICD-10-CM

## 2020-06-27 DIAGNOSIS — G4733 Obstructive sleep apnea (adult) (pediatric): Secondary | ICD-10-CM | POA: Diagnosis not present

## 2020-06-27 DIAGNOSIS — E785 Hyperlipidemia, unspecified: Secondary | ICD-10-CM | POA: Insufficient documentation

## 2020-06-27 DIAGNOSIS — E854 Organ-limited amyloidosis: Secondary | ICD-10-CM | POA: Diagnosis not present

## 2020-06-27 DIAGNOSIS — I2510 Atherosclerotic heart disease of native coronary artery without angina pectoris: Secondary | ICD-10-CM | POA: Insufficient documentation

## 2020-06-27 DIAGNOSIS — N183 Chronic kidney disease, stage 3 unspecified: Secondary | ICD-10-CM | POA: Diagnosis not present

## 2020-06-27 DIAGNOSIS — Z79899 Other long term (current) drug therapy: Secondary | ICD-10-CM | POA: Insufficient documentation

## 2020-06-27 DIAGNOSIS — Z87891 Personal history of nicotine dependence: Secondary | ICD-10-CM | POA: Insufficient documentation

## 2020-06-27 DIAGNOSIS — Z955 Presence of coronary angioplasty implant and graft: Secondary | ICD-10-CM | POA: Diagnosis not present

## 2020-06-27 DIAGNOSIS — I129 Hypertensive chronic kidney disease with stage 1 through stage 4 chronic kidney disease, or unspecified chronic kidney disease: Secondary | ICD-10-CM | POA: Diagnosis not present

## 2020-06-27 HISTORY — PX: TEE WITHOUT CARDIOVERSION: SHX5443

## 2020-06-27 HISTORY — DX: Type 2 diabetes mellitus without complications: E11.9

## 2020-06-27 SURGERY — ECHOCARDIOGRAM, TRANSESOPHAGEAL
Anesthesia: Monitor Anesthesia Care

## 2020-06-27 MED ORDER — LIDOCAINE 2% (20 MG/ML) 5 ML SYRINGE
INTRAMUSCULAR | Status: DC | PRN
  Administered 2020-06-27: 100 mg via INTRAVENOUS

## 2020-06-27 MED ORDER — PROPOFOL 10 MG/ML IV BOLUS: INTRAVENOUS | Status: DC | PRN

## 2020-06-27 MED ORDER — EPHEDRINE SULFATE 50 MG/ML IJ SOLN
INTRAMUSCULAR | Status: DC | PRN
  Administered 2020-06-27: 5 mg via INTRAVENOUS

## 2020-06-27 MED ORDER — PHENYLEPHRINE 40 MCG/ML (10ML) SYRINGE FOR IV PUSH (FOR BLOOD PRESSURE SUPPORT)
PREFILLED_SYRINGE | INTRAVENOUS | Status: DC | PRN
  Administered 2020-06-27 (×5): 80 ug via INTRAVENOUS

## 2020-06-27 MED ORDER — BUTAMBEN-TETRACAINE-BENZOCAINE 2-2-14 % EX AERO
INHALATION_SPRAY | CUTANEOUS | Status: DC | PRN
  Administered 2020-06-27: 2 via TOPICAL

## 2020-06-27 MED ORDER — PROPOFOL 10 MG/ML IV BOLUS
INTRAVENOUS | Status: DC | PRN
  Administered 2020-06-27: 20 mg via INTRAVENOUS
  Administered 2020-06-27 (×2): 30 mg via INTRAVENOUS
  Administered 2020-06-27: 20 mg via INTRAVENOUS

## 2020-06-27 MED ORDER — SODIUM CHLORIDE 0.9 % IV SOLN: INTRAVENOUS | Status: DC

## 2020-06-27 MED ORDER — PROPOFOL 500 MG/50ML IV EMUL
INTRAVENOUS | Status: DC | PRN
  Administered 2020-06-27: 150 ug/kg/min via INTRAVENOUS

## 2020-06-27 NOTE — Anesthesia Preprocedure Evaluation (Signed)
Anesthesia Evaluation  Patient identified by MRN, date of birth, ID band Patient awake    Reviewed: Allergy & Precautions, NPO status , Patient's Chart, lab work & pertinent test results  Airway Mallampati: II  TM Distance: >3 FB Neck ROM: Full    Dental no notable dental hx.    Pulmonary sleep apnea , former smoker,    Pulmonary exam normal breath sounds clear to auscultation       Cardiovascular Exercise Tolerance: Poor hypertension, + CAD and + Past MI (h/o NSTEMI)   Rhythm:Regular  EKG: accelerated junctional rhythm   Neuro/Psych PSYCHIATRIC DISORDERS Depression Cerebral amyloid  Neuromuscular disease (sciateica)    GI/Hepatic negative GI ROS,   Endo/Other  diabetes  Renal/GU CRF and Renal InsufficiencyRenal disease     Musculoskeletal negative musculoskeletal ROS (+)   Abdominal Normal abdominal exam  (+)   Peds  Hematology negative hematology ROS (+)   Anesthesia Other Findings   Reproductive/Obstetrics negative OB ROS                             Anesthesia Physical Anesthesia Plan  ASA: III  Anesthesia Plan: MAC   Post-op Pain Management:    Induction:   PONV Risk Score and Plan: 1 and Propofol infusion, TIVA and Treatment may vary due to age or medical condition  Airway Management Planned: Natural Airway and Simple Face Mask  Additional Equipment: None  Intra-op Plan:   Post-operative Plan:   Informed Consent: I have reviewed the patients History and Physical, chart, labs and discussed the procedure including the risks, benefits and alternatives for the proposed anesthesia with the patient or authorized representative who has indicated his/her understanding and acceptance.       Plan Discussed with: CRNA and Anesthesiologist  Anesthesia Plan Comments:         Anesthesia Quick Evaluation

## 2020-06-27 NOTE — Transfer of Care (Signed)
Immediate Anesthesia Transfer of Care Note  Patient: Jesse [name]  Procedure(s) Performed: TRANSESOPHAGEAL ECHOCARDIOGRAM (TEE) (N/A )  Patient Location: Endoscopy Unit  Anesthesia Type:MAC  Level of Consciousness: awake, alert  and oriented  Airway & Oxygen Therapy: Patient Spontanous Breathing and Patient connected to face mask oxygen  Post-op Assessment: Report given to RN, Post -op Vital signs reviewed and stable and Patient moving all extremities  Post vital signs: Reviewed and stable  Last Vitals:  Vitals Value Taken Time  BP 106/53 06/27/20 1032  Temp 36.7 C 06/27/20 1032  Pulse 56 06/27/20 1034  Resp 17 06/27/20 1034  SpO2 100 % 06/27/20 1034  Vitals shown include unvalidated device data.  Last Pain:  Vitals:   06/27/20 1032  TempSrc: Oral  PainSc: 0-No pain         Complications: No complications documented.

## 2020-06-27 NOTE — Anesthesia Postprocedure Evaluation (Signed)
Anesthesia Post Note  Patient: Jesse [name]  Procedure(s) Performed: TRANSESOPHAGEAL ECHOCARDIOGRAM (TEE) (N/A )     Patient location during evaluation: Endoscopy Anesthesia Type: MAC Level of consciousness: awake and alert Pain management: pain level controlled Vital Signs Assessment: post-procedure vital signs reviewed and stable Respiratory status: spontaneous breathing, nonlabored ventilation and respiratory function stable Cardiovascular status: blood pressure returned to baseline and stable Postop Assessment: no apparent nausea or vomiting Anesthetic complications: no   No complications documented.  Last Vitals:  Vitals:   06/27/20 1050 06/27/20 1100  BP: (!) 124/58 131/72  Pulse: (!) 58 (!) 58  Resp: 17 15  Temp:    SpO2: 98% 99%    Last Pain:  Vitals:   06/27/20 1100  TempSrc:   PainSc: 0-No pain                 Merlinda Frederick

## 2020-06-27 NOTE — Progress Notes (Signed)
*  PRELIMINARY RESULTS* Echocardiogram Echocardiogram Transesophageal has been performed.  Matilde Bash 06/27/2020, 10:49 AM

## 2020-06-27 NOTE — CV Procedure (Signed)
Brief TEE Note  LVEF 60-65% No LA/LAA thrombus or masses The mitral valve is degenerative and MAC is present Posterior mitral valve prolapse with severe mitral regurgitation directed anteriorly  For additional details see full report.  Jesse [name] C. Jesse Linsey, MD, [facility] 06/27/2020 10:24 AM

## 2020-06-27 NOTE — Discharge Instructions (Signed)
Transesophageal Echocardiogram Transesophageal echocardiogram (TEE) is a test that uses sound waves to take pictures of your heart. TEE is done by passing a small probe attached to a flexible tube down the part of the body that moves food from your mouth to your stomach (esophagus). The pictures give clear images of your heart. This can help your doctor see if there are problems with your heart.  What can I expect after the procedure?  You will be monitored until you leave the hospital or clinic. This includes checking your blood pressure, heart rate, breathing rate, and blood oxygen level.  Your throat may feel sore and numb. This will get better over time. You will not be allowed to eat or drink until the numbness has gone away.  It is common to have a sore throat for a day or two.  It is up to you to get the results of your procedure. Ask how to get your results when they are ready.  Follow these instructions at home:  If you were given a sedative during your procedure, do not drive or use machines until your doctor says that it is safe.  Return to your normal activities when your doctor says that it is safe.  Keep all follow-up visits.  Summary  TEE is a test that uses sound waves to take pictures of your heart.  You will be given a medicine to help you relax.  Do not drive or use machines until your doctor says that it is safe.  This information is not intended to replace advice given to you by your health care provider. Make sure you discuss any questions you have with your health care provider. Document Revised: 12/21/2019 Document Reviewed: 12/21/2019 Elsevier Patient Education  2021 [employer].

## 2020-06-27 NOTE — Interval H&P Note (Signed)
History and Physical Interval Note:  06/27/2020 9:35 AM  Jesse [name]  has presented today for surgery, with the diagnosis of MITRAL INSUFFICIENCY.  The various methods of treatment have been discussed with the patient and family. After consideration of risks, benefits and other options for treatment, the patient has consented to  Procedure(s): TRANSESOPHAGEAL ECHOCARDIOGRAM (TEE) (N/A) as a surgical intervention.  The patient's history has been reviewed, patient examined, no change in status, stable for surgery.  I have reviewed the patient's chart and labs.  Questions were answered to the patient's satisfaction.     Skeet Latch, MD

## 2020-06-28 LAB — ECHO TEE
Area-P 1/2: 2.19 cm2
MV M vel: 3.82 m/s
MV Peak grad: 58.4 mmHg
MV VTI: 0.67 cm2
MV Vena cont: 0.46 cm
Radius: 1.3 cm

## 2020-06-28 NOTE — Telephone Encounter (Signed)
Already taken care of

## 2020-06-29 ENCOUNTER — Encounter (HOSPITAL_COMMUNITY): Payer: Self-pay | Admitting: Cardiovascular Disease

## 2020-06-29 ENCOUNTER — Telehealth: Payer: Self-pay

## 2020-06-29 DIAGNOSIS — I340 Nonrheumatic mitral (valve) insufficiency: Secondary | ICD-10-CM

## 2020-06-29 DIAGNOSIS — I341 Nonrheumatic mitral (valve) prolapse: Secondary | ICD-10-CM

## 2020-06-29 LAB — GLUCOSE, CAPILLARY: Glucose-Capillary: 134 mg/dL — ABNORMAL HIGH (ref 70–99)

## 2020-06-29 NOTE — Telephone Encounter (Signed)
Called patient left message to call me back for TEE results.

## 2020-07-03 ENCOUNTER — Other Ambulatory Visit: Payer: Self-pay

## 2020-07-03 ENCOUNTER — Institutional Professional Consult (permissible substitution): Payer: PPO | Admitting: Thoracic Surgery (Cardiothoracic Vascular Surgery)

## 2020-07-03 ENCOUNTER — Other Ambulatory Visit: Payer: Self-pay | Admitting: Thoracic Surgery (Cardiothoracic Vascular Surgery)

## 2020-07-03 ENCOUNTER — Encounter: Payer: Self-pay | Admitting: Thoracic Surgery (Cardiothoracic Vascular Surgery)

## 2020-07-03 VITALS — BP 155/78 | HR 61 | Resp 20 | Ht 70.0 in | Wt 220.0 lb

## 2020-07-03 DIAGNOSIS — I340 Nonrheumatic mitral (valve) insufficiency: Secondary | ICD-10-CM

## 2020-07-03 DIAGNOSIS — I341 Nonrheumatic mitral (valve) prolapse: Secondary | ICD-10-CM

## 2020-07-03 NOTE — Patient Instructions (Signed)
   Continue taking all current medications without change through the day before surgery.  Make sure to bring all of your medications with you when you come for your Pre-Admission Testing appointment at [facility] Short-Stay Department.  Have nothing to eat or drink after midnight the night before surgery.  On the morning of surgery do not take any medications.  At your appointment for Pre-Admission Testing at the [facility] Short-Stay Department you will be asked to sign permission forms for your upcoming surgery.  By definition your signature on these forms implies that you and/or your designee provide full informed consent for your planned surgical procedure(s), that alternative treatment options have been discussed, that you understand and accept any and all potential risks, and that you have some understanding of what to expect for your post-operative convalescence.  For elective mitral valve repair or replacement potential operative risks include but are not limited to at least some risk of death, stroke or other neurologic complication, myocardial infarction, congestive heart failure, respiratory failure, renal failure, bleeding requiring transfusion and/or reexploration, arrhythmia, heart block or bradycardia requiring permanent pacemaker insertion, infection or other wound complications, pneumonia, pleural and/or pericardial effusion, pulmonary embolus, aortic dissection or other major vascular complication, or other immediate or delayed complications related to valve repair or replacement including but not limited to recurrent or persistent mitral regurgitation and/or mitral stenosis, LV outflow tract obstruction, aortic insufficiency, paravalvular leak, posterior AV groove disruption, late structural valve deterioration and failure, thrombosis, embolization, or endocarditis.  Specific risks potentially related to the minimally-invasive approach include but  are not limited to risk of conversion to full or partial sternotomy, aortic dissection or other major vascular complication, unilateral acute lung injury or pulmonary edema, phrenic nerve dysfunction or paralysis, rib fracture, chronic pain, lung hernia, or lymphocele.  Please call to schedule a follow-up appointment in our office prior to surgery if you have any unresolved questions about your planned surgical procedure, the associated risks, alternative treatment options, and/or expectations for your post-operative recovery.    

## 2020-07-03 NOTE — Progress Notes (Signed)
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 descending  aorta.   FINDINGS  Left Ventricle: Left ventricular ejection fraction, by estimation, is 60  to 65%. The left ventricle has normal function. The left ventricle has no  regional wall motion abnormalities. The left ventricular internal cavity  size was normal in size. There is  no left ventricular hypertrophy.   Right  Ventricle: The right ventricular size is normal. No increase in  right ventricular wall thickness. Right ventricular systolic function is  normal.   Left Atrium: Left atrial size was normal in size. No left atrial/left  atrial appendage thrombus was detected.   Right Atrium: Right atrial size was normal in size.   Pericardium: There is no evidence of pericardial effusion.   Mitral Valve: Posterior mitral annulus calcification. Carpentier Class II  (prolapse) severe mitral regurgitation directed anteriorly. Prolapse noted  in the P2 segment. The mitral regurgitant jet is very eccentric and  anteriorly directed, which can  underestimate the MR TVI. There is no significant sytolic flow reversal in  the pulmonary veins. Regurgitant volume is 95 mL. Regurgitant fraction  56%. ERO 86 mm^2. Mitral valve  inflow E/A ratio is 1.4 (>1.2), all  consistent with severe MR. The mitral  valve is normal in structure. There is mild thickening of the mitral valve  leaflet(s). Moderate mitral annular calcification. Severe mitral valve  regurgitation, with anteriorly-directed jet. No evidence of mitral valve  stenosis.   Tricuspid Valve: The tricuspid valve is normal in structure. Tricuspid  valve regurgitation is trivial. No evidence of tricuspid stenosis.   Aortic Valve: The aortic valve is normal in structure. Aortic valve  regurgitation is not visualized. No aortic stenosis is present.   Pulmonic Valve: The pulmonic valve was normal in structure. Pulmonic valve  regurgitation is not visualized. No evidence of pulmonic stenosis.   Aorta: The aortic root is normal in size and structure. There is mild  (Grade II) layered plaque involving the descending aorta.   IAS/Shunts: No atrial level shunt detected by color flow Doppler.     LEFT VENTRICLE  PLAX 2D  LVOT diam:   2.60 cm  LV SV:     74  LV SV Index:  34  LVOT Area:   5.31 cm     AORTIC VALVE  LVOT Vmax:  80.60 cm/s  LVOT  Vmean: 43.000 cm/s  LVOT VTI:  0.139 [name]   MITRAL VALVE  MV Area (PHT): 2.19 cm    SHUNTS  MV Area VTI:  0.67 cm    Systemic VTI: 0.14 [name]  MV VTI:    1.10 [name]     Systemic Diam: 2.60 cm  MV Decel Time: 346 msec  MR Peak grad:   58.4 mmHg  MR Mean grad:   35.0 mmHg  MR Vmax:      382.00 cm/s  MR Vmean:     282.0 cm/s  MR Vena Contracta: 0.46 cm  MR PISA Nyquist:  0.3 [name]/s  MR PISA:      10.62 cm  MR PISA Radius:  1.30 cm  MV E velocity: 85.10 cm/s  MV A velocity: 61.10 cm/s  MV E/A ratio: 1.39   Jesse Latch MD  Electronically signed by Jesse Latch MD  Signature Date/Time: 06/28/2020/7:02:45 PM      Impression:  Patient has mitral valve prolapse with stage C severe asymptomatic primary mitral regurgitation.  He complains of decreased energy but overall specifically denies any symptoms of exertional shortness of breath or chest discomfort.  I have personally reviewed the patient's recent transthoracic and transesophageal echocardiograms as well as his diagnostic cardiac catheterization.  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  There is some leaflet thickening, fibrosis, and possible calcification near the posterior annulus as well.  There were ruptured primary chordae tendinae with severe mitral regurgitation.  Left ventricular systolic function remains normal.  Diagnostic cardiac catheterization reveals continued patency of stent in the right coronary artery with otherwise nonobstructive coronary artery disease.  There was mild pulmonary hypertension.  Options include continued close observation on medical therapy versus elective mitral valve repair.  Based upon review of the patient's transesophageal echocardiograms I feel there is a high likelihood that his valve should be repairable, although the potential need for valve  replacement may be somewhat higher due to the presence of some leaflet and posterior annular calcification.  Risks associated with surgery should be quite low.  The patient may be an acceptable candidate for minimally invasive approach for surgery.   Plan:  The patient and his wife were both counseled at length regarding diagnosis  of severe primary mitral regurgitation.  We reviewed the results of their diagnostic tests including images from the most recent echocardiogram.  We discussed the natural history of mitral regurgitation as well as alternative treatment strategies.  We discussed the impact of age, current state of health, and any significant comorbid medical problems on clinical decision making.  We went on to discuss the indications, risks and potential benefits of mitral valve repair as well as the timing of surgical intervention.  The rationale for elective surgery has been explained, including a comparison between surgery and continued medical therapy with close follow-up.  The likelihood of successful and durable mitral valve repair has been discussed with particular reference to the findings of the most recent echocardiogram.  Based upon these findings and previous experience, I have quoted a greater than 95 percent likelihood of successful valve repair with less than 1 percent risk of mortality or major morbidity.  Alternative surgical approaches have been discussed including a comparison between conventional sternotomy and minimally-invasive techniques.  The relative risks and benefits of each have been reviewed as they pertain to the patient's specific circumstances, and expectations for the patient's postoperative convalescence has been discussed. The patient desires to proceed with elective mitral valve repair in the near future.  We tentatively plan for surgery on July 18, 2020.  Prior to surgery the patient will undergo CT angiography to evaluate the feasibility of peripheral cannulation  for surgery.  He will return to our office for follow-up prior to surgery on July 17, 2020.   I spent in excess of 90 minutes during the conduct of this office consultation and >50% of this time involved direct face-to-face encounter with the patient for counseling and/or coordination of their care.    Valentina Gu. Roxy Manns, MD 07/03/2020 11:46 AM

## 2020-07-04 ENCOUNTER — Other Ambulatory Visit: Payer: Self-pay | Admitting: *Deleted

## 2020-07-04 ENCOUNTER — Encounter: Payer: Self-pay | Admitting: *Deleted

## 2020-07-04 DIAGNOSIS — I340 Nonrheumatic mitral (valve) insufficiency: Secondary | ICD-10-CM

## 2020-07-07 ENCOUNTER — Ambulatory Visit
Admission: RE | Admit: 2020-07-07 | Discharge: 2020-07-07 | Disposition: A | Discharge status: Home / Self Care | Payer: PPO | Source: Ambulatory Visit | Attending: Thoracic Surgery (Cardiothoracic Vascular Surgery) | Admitting: Thoracic Surgery (Cardiothoracic Vascular Surgery)

## 2020-07-07 DIAGNOSIS — N400 Enlarged prostate without lower urinary tract symptoms: Secondary | ICD-10-CM | POA: Diagnosis not present

## 2020-07-07 DIAGNOSIS — I350 Nonrheumatic aortic (valve) stenosis: Secondary | ICD-10-CM | POA: Diagnosis not present

## 2020-07-07 DIAGNOSIS — I2510 Atherosclerotic heart disease of native coronary artery without angina pectoris: Secondary | ICD-10-CM | POA: Diagnosis not present

## 2020-07-07 DIAGNOSIS — I340 Nonrheumatic mitral (valve) insufficiency: Secondary | ICD-10-CM | POA: Diagnosis not present

## 2020-07-07 DIAGNOSIS — Z01818 Encounter for other preprocedural examination: Secondary | ICD-10-CM | POA: Diagnosis not present

## 2020-07-07 DIAGNOSIS — I341 Nonrheumatic mitral (valve) prolapse: Secondary | ICD-10-CM

## 2020-07-07 DIAGNOSIS — I708 Atherosclerosis of other arteries: Secondary | ICD-10-CM | POA: Diagnosis not present

## 2020-07-07 DIAGNOSIS — I348 Other nonrheumatic mitral valve disorders: Secondary | ICD-10-CM | POA: Diagnosis not present

## 2020-07-07 MED ORDER — IOPAMIDOL (ISOVUE-370) INJECTION 76%
75.0000 mL | Freq: Once | INTRAVENOUS | Status: AC | PRN
  Administered 2020-07-07: 75 mL via INTRAVENOUS

## 2020-07-13 NOTE — Progress Notes (Signed)
Surgical Instructions    Your procedure is scheduled on 07/18/20.  Report to [facility] Main Entrance "A" at 5:30 A.M., then check in with the Admitting office.  Call this number if you have problems the morning of surgery:  [phone]   If you have any questions prior to your surgery date call [phone]: Open Monday-Friday 8am-4pm    Remember:  Do not eat OR drink after midnight the night before your surgery    Take these medicines the morning of surgery with A SIP OF WATER: carvedilol (COREG) nitroGLYCERIN (NITROSTAT) - if needed   As of today, STOP taking any Aspirin (unless otherwise instructed by your surgeon) Aleve, Naproxen, Ibuprofen, Motrin, Advil, Goody's, BC's, all herbal medications, fish oil, and all vitamins.                     Do not wear jewelry            Do not wear lotions, powders, colognes, or deodorant.            Do not shave 48 hours prior to surgery.  Men may shave face and neck.            Do not bring valuables to the hospital.            [facility] is not responsible for any belongings or valuables.  Do NOT Smoke (Tobacco/Vaping) or drink Alcohol 24 hours prior to your procedure If you use a CPAP at night, you may bring all equipment for your overnight stay.   Contacts, glasses, dentures or bridgework may not be worn into surgery, please bring cases for these belongings   For patients admitted to the hospital, discharge time will be determined by your treatment team.   Patients discharged the day of surgery will not be allowed to drive home, and someone needs to stay with them for 24 hours.    Special instructions:   [facility]- Preparing For Surgery  Before surgery, you can play an important role. Because skin is not sterile, your skin needs to be as free of germs as possible. You can reduce the number of germs on your skin by washing with CHG (chlorahexidine gluconate) Soap before surgery.  CHG is an antiseptic cleaner which kills germs  and bonds with the skin to continue killing germs even after washing.    Oral Hygiene is also important to reduce your risk of infection.  Remember - BRUSH YOUR TEETH THE MORNING OF SURGERY WITH YOUR REGULAR TOOTHPASTE  Please do not use if you have an allergy to CHG or antibacterial soaps. If your skin becomes reddened/irritated stop using the CHG.  Do not shave (including legs and underarms) for at least 48 hours prior to first CHG shower. It is OK to shave your face.  Please follow these instructions carefully.   1. Shower the NIGHT BEFORE SURGERY and the MORNING OF SURGERY  2. If you chose to wash your hair, wash your hair first as usual with your normal shampoo.  3. After you shampoo, rinse your hair and body thoroughly to remove the shampoo.  4. Wash Face and genitals (private parts) with your normal soap.   5.  Shower the NIGHT BEFORE SURGERY and the MORNING OF SURGERY with CHG Soap.   6. Use CHG Soap as you would any other liquid soap. You can apply CHG directly to the skin and wash gently with a scrungie or a clean washcloth.   7. Apply  the CHG Soap to your body ONLY FROM THE NECK DOWN.  Do not use on open wounds or open sores. Avoid contact with your eyes, ears, mouth and genitals (private parts). Wash Face and genitals (private parts)  with your normal soap.   8. Wash thoroughly, paying special attention to the area where your surgery will be performed.  9. Thoroughly rinse your body with warm water from the neck down.  10. DO NOT shower/wash with your normal soap after using and rinsing off the CHG Soap.  11. Pat yourself dry with a CLEAN TOWEL.  12. Wear CLEAN PAJAMAS to bed the night before surgery  13. Place CLEAN SHEETS on your bed the night before your surgery  14. DO NOT SLEEP WITH PETS.   Day of Surgery: Wear Clean/Comfortable clothing the morning of surgery Do not apply any deodorants/lotions.   Remember to brush your teeth WITH YOUR REGULAR TOOTHPASTE.    Please read over the following fact sheets that you were given.     2

## 2020-07-14 ENCOUNTER — Encounter (HOSPITAL_COMMUNITY)
Admission: RE | Admit: 2020-07-14 | Discharge: 2020-07-14 | Disposition: A | Discharge status: Home / Self Care | Payer: PPO | Source: Ambulatory Visit | Attending: Thoracic Surgery (Cardiothoracic Vascular Surgery) | Admitting: Thoracic Surgery (Cardiothoracic Vascular Surgery)

## 2020-07-14 ENCOUNTER — Other Ambulatory Visit: Payer: Self-pay

## 2020-07-14 ENCOUNTER — Encounter (HOSPITAL_COMMUNITY): Payer: Self-pay

## 2020-07-14 ENCOUNTER — Other Ambulatory Visit (HOSPITAL_COMMUNITY): Payer: PPO

## 2020-07-14 ENCOUNTER — Ambulatory Visit (HOSPITAL_COMMUNITY)
Admission: RE | Admit: 2020-07-14 | Discharge: 2020-07-14 | Disposition: A | Discharge status: Home / Self Care | Payer: PPO | Source: Ambulatory Visit | Attending: Thoracic Surgery (Cardiothoracic Vascular Surgery) | Admitting: Thoracic Surgery (Cardiothoracic Vascular Surgery)

## 2020-07-14 DIAGNOSIS — I252 Old myocardial infarction: Secondary | ICD-10-CM | POA: Insufficient documentation

## 2020-07-14 DIAGNOSIS — I2510 Atherosclerotic heart disease of native coronary artery without angina pectoris: Secondary | ICD-10-CM | POA: Diagnosis not present

## 2020-07-14 DIAGNOSIS — Z955 Presence of coronary angioplasty implant and graft: Secondary | ICD-10-CM | POA: Insufficient documentation

## 2020-07-14 DIAGNOSIS — I340 Nonrheumatic mitral (valve) insufficiency: Secondary | ICD-10-CM | POA: Insufficient documentation

## 2020-07-14 DIAGNOSIS — R7303 Prediabetes: Secondary | ICD-10-CM | POA: Insufficient documentation

## 2020-07-14 DIAGNOSIS — Z01818 Encounter for other preprocedural examination: Secondary | ICD-10-CM

## 2020-07-14 DIAGNOSIS — I129 Hypertensive chronic kidney disease with stage 1 through stage 4 chronic kidney disease, or unspecified chronic kidney disease: Secondary | ICD-10-CM | POA: Insufficient documentation

## 2020-07-14 DIAGNOSIS — I10 Essential (primary) hypertension: Secondary | ICD-10-CM | POA: Diagnosis not present

## 2020-07-14 DIAGNOSIS — E785 Hyperlipidemia, unspecified: Secondary | ICD-10-CM | POA: Insufficient documentation

## 2020-07-14 DIAGNOSIS — N183 Chronic kidney disease, stage 3 unspecified: Secondary | ICD-10-CM | POA: Diagnosis not present

## 2020-07-14 DIAGNOSIS — S2231XA Fracture of one rib, right side, initial encounter for closed fracture: Secondary | ICD-10-CM | POA: Diagnosis not present

## 2020-07-14 HISTORY — DX: Unspecified hearing loss, unspecified ear: H91.90

## 2020-07-14 HISTORY — DX: Sleep apnea, unspecified: G47.30

## 2020-07-14 HISTORY — DX: Prediabetes: R73.03

## 2020-07-14 LAB — COMPREHENSIVE METABOLIC PANEL
ALT: 25 U/L (ref 0–44)
AST: 25 U/L (ref 15–41)
Albumin: 3.9 g/dL (ref 3.5–5.0)
Alkaline Phosphatase: 66 U/L (ref 38–126)
Anion gap: 10 (ref 5–15)
BUN: 13 mg/dL (ref 8–23)
CO2: 20 mmol/L — ABNORMAL LOW (ref 22–32)
Calcium: 9.2 mg/dL (ref 8.9–10.3)
Chloride: 107 mmol/L (ref 98–111)
Creatinine, Ser: 1.14 mg/dL (ref 0.61–1.24)
GFR, Estimated: >60 mL/min (ref >60)
Glucose, Bld: 129 mg/dL — ABNORMAL HIGH (ref 70–99)
Potassium: 3.4 mmol/L — ABNORMAL LOW (ref 3.5–5.1)
Sodium: 137 mmol/L (ref 135–145)
Total Bilirubin: 1.3 mg/dL — ABNORMAL HIGH (ref 0.3–1.2)
Total Protein: 6.7 g/dL (ref 6.5–8.1)

## 2020-07-14 LAB — GLUCOSE, CAPILLARY: Glucose-Capillary: 98 mg/dL (ref 70–99)

## 2020-07-14 LAB — BLOOD GAS, ARTERIAL
Acid-base deficit: 0.4 mmol/L (ref 0.0–2.0)
Bicarbonate: 23.4 mmol/L (ref 20.0–28.0)
Drawn by: 602861
FIO2: 21
O2 Saturation: 96.3 %
Patient temperature: 37
pCO2 arterial: 36.2 mmHg (ref 32.0–48.0)
pH, Arterial: 7.427 (ref 7.350–7.450)
pO2, Arterial: 84.8 mmHg (ref 83.0–108.0)

## 2020-07-14 LAB — CBC
HCT: 39 % (ref 39.0–52.0)
Hemoglobin: 13.6 g/dL (ref 13.0–17.0)
MCH: 33.9 pg (ref 26.0–34.0)
MCHC: 34.9 g/dL (ref 30.0–36.0)
MCV: 97.3 fL (ref 80.0–100.0)
Platelets: 174 10*3/uL (ref 150–400)
RBC: 4.01 MIL/uL — ABNORMAL LOW (ref 4.22–5.81)
RDW: 13.5 % (ref 11.5–15.5)
WBC: 7.1 10*3/uL (ref 4.0–10.5)
nRBC: 0 % (ref 0.0–0.2)

## 2020-07-14 LAB — HEMOGLOBIN A1C
Hgb A1c MFr Bld: 6 % — ABNORMAL HIGH (ref 4.8–5.6)
Mean Plasma Glucose: 125.5 mg/dL

## 2020-07-14 LAB — URINALYSIS, ROUTINE W REFLEX MICROSCOPIC
Bilirubin Urine: NEGATIVE
Glucose, UA: NEGATIVE mg/dL
Hgb urine dipstick: NEGATIVE
Ketones, ur: NEGATIVE mg/dL
Leukocytes,Ua: NEGATIVE
Nitrite: NEGATIVE
Protein, ur: NEGATIVE mg/dL
Specific Gravity, Urine: 1.01 (ref 1.005–1.030)
pH: 5 (ref 5.0–8.0)

## 2020-07-14 LAB — PROTIME-INR
INR: 1 (ref 0.8–1.2)
Prothrombin Time: 12.8 seconds (ref 11.4–15.2)

## 2020-07-14 LAB — SURGICAL PCR SCREEN
MRSA, PCR: NEGATIVE
Staphylococcus aureus: NEGATIVE

## 2020-07-14 LAB — APTT: aPTT: 32 seconds (ref 24–36)

## 2020-07-14 NOTE — CV Procedure (Addendum)
Carotid Duplex and BUE dopplers for MVR completed.   Results can be found under chart review under CV PROC. 07/14/2020 4:36 PM Kamel Haven RVT, RDMS

## 2020-07-14 NOTE — Progress Notes (Signed)
PCP - Dr Ria Bush Cardiologist - Dr Peter [name]  Chest x-ray - 07/14/20 EKG - 07/14/20 Stress Test - 01/16/16 ECHO TEE - 06/27/20 Cardiac Cath - 03/20/20  Sleep Apnea - Patient denies  CPAP -None  Fasting Blood Sugar - Unknown Checks Blood Sugar - Occasional will check blood sugar - No DM Meds  Aspirin Instructions: Follow your surgeon's instructions on when to stop aspirin prior to surgery,  If no instructions were given by your surgeon then you will need to call the office for those instructions.  Anesthesia review: Yes  STOP now taking any Aspirin (unless otherwise instructed by your surgeon), Aleve, Naproxen, Ibuprofen, Motrin, Advil, Goody's, BC's, all herbal medications, fish oil, and all vitamins.   Coronavirus Screening Covid test is scheduled on 07/14/20  Do you have any of the following symptoms:  Cough yes/no: No Fever (>100.89F)  yes/no: No Runny nose yes/no: No Sore throat yes/no: No Difficulty breathing/shortness of breath  yes/no: No  Have you traveled in the last 14 days and where? yes/no: No  Patient verbalized understanding of instructions that were given to them at the PAT appointment.

## 2020-07-17 ENCOUNTER — Encounter (HOSPITAL_COMMUNITY): Payer: Self-pay | Admitting: Thoracic Surgery (Cardiothoracic Vascular Surgery)

## 2020-07-17 ENCOUNTER — Ambulatory Visit (INDEPENDENT_AMBULATORY_CARE_PROVIDER_SITE_OTHER): Payer: PPO | Admitting: Thoracic Surgery (Cardiothoracic Vascular Surgery)

## 2020-07-17 ENCOUNTER — Other Ambulatory Visit: Payer: Self-pay

## 2020-07-17 ENCOUNTER — Encounter: Payer: Self-pay | Admitting: Thoracic Surgery (Cardiothoracic Vascular Surgery)

## 2020-07-17 VITALS — BP 142/47 | HR 79 | Temp 99.5°F | Resp 20 | Ht 70.0 in | Wt 219.5 lb

## 2020-07-17 DIAGNOSIS — I340 Nonrheumatic mitral (valve) insufficiency: Secondary | ICD-10-CM

## 2020-07-17 DIAGNOSIS — I341 Nonrheumatic mitral (valve) prolapse: Secondary | ICD-10-CM

## 2020-07-17 MED ORDER — PLASMA-LYTE 148 IV SOLN
INTRAVENOUS | Status: DC
  Filled 2020-07-17: qty 2.5

## 2020-07-17 MED ORDER — TRANEXAMIC ACID (OHS) PUMP PRIME SOLUTION
2.0000 mg/kg | INTRAVENOUS | Status: DC
  Filled 2020-07-17: qty 2

## 2020-07-17 MED ORDER — MILRINONE LACTATE IN DEXTROSE 20-5 MG/100ML-% IV SOLN
0.3000 ug/kg/min | INTRAVENOUS | Status: DC
  Filled 2020-07-17: qty 100

## 2020-07-17 MED ORDER — SODIUM CHLORIDE 0.9 % IV SOLN
750.0000 mg | INTRAVENOUS | Status: AC
  Administered 2020-07-18: 750 mg via INTRAVENOUS
  Filled 2020-07-17: qty 750

## 2020-07-17 MED ORDER — VANCOMYCIN HCL 1500 MG/300ML IV SOLN
1500.0000 mg | INTRAVENOUS | Status: AC
  Administered 2020-07-18: 1500 mg via INTRAVENOUS
  Filled 2020-07-17: qty 300

## 2020-07-17 MED ORDER — SODIUM CHLORIDE 0.9 % IV SOLN
1.5000 g | INTRAVENOUS | Status: AC
  Administered 2020-07-18: 1.5 g via INTRAVENOUS
  Filled 2020-07-17: qty 1.5

## 2020-07-17 MED ORDER — POTASSIUM CHLORIDE 2 MEQ/ML IV SOLN
80.0000 meq | INTRAVENOUS | Status: DC
  Filled 2020-07-17: qty 40

## 2020-07-17 MED ORDER — EPINEPHRINE HCL 5 MG/250ML IV SOLN IN NS
0.0000 ug/min | INTRAVENOUS | Status: DC
  Filled 2020-07-17: qty 250

## 2020-07-17 MED ORDER — SODIUM CHLORIDE 0.9 % IV SOLN
INTRAVENOUS | Status: DC
  Filled 2020-07-17: qty 30

## 2020-07-17 MED ORDER — MANNITOL 20 % IV SOLN
Freq: Once | INTRAVENOUS | Status: DC
  Filled 2020-07-17: qty 13

## 2020-07-17 MED ORDER — NITROGLYCERIN IN D5W 200-5 MCG/ML-% IV SOLN
2.0000 ug/min | INTRAVENOUS | Status: AC
  Administered 2020-07-18: 5 ug/min via INTRAVENOUS
  Filled 2020-07-17: qty 250

## 2020-07-17 MED ORDER — TRANEXAMIC ACID (OHS) BOLUS VIA INFUSION
15.0000 mg/kg | INTRAVENOUS | Status: AC
  Administered 2020-07-18: 1497 mg via INTRAVENOUS
  Filled 2020-07-17: qty 1497

## 2020-07-17 MED ORDER — TRANEXAMIC ACID 1000 MG/10ML IV SOLN
1.5000 mg/kg/h | INTRAVENOUS | Status: AC
  Administered 2020-07-18: 1.5 mg/kg/h via INTRAVENOUS
  Filled 2020-07-17: qty 25

## 2020-07-17 MED ORDER — VANCOMYCIN HCL 1000 MG IV SOLR
INTRAVENOUS | Status: DC
  Filled 2020-07-17: qty 1000

## 2020-07-17 MED ORDER — INSULIN REGULAR(HUMAN) IN NACL 100-0.9 UT/100ML-% IV SOLN
INTRAVENOUS | Status: AC
  Administered 2020-07-18: 1 [IU]/h via INTRAVENOUS
  Filled 2020-07-17: qty 100

## 2020-07-17 MED ORDER — NOREPINEPHRINE 4 MG/250ML-% IV SOLN
0.0000 ug/min | INTRAVENOUS | Status: DC
  Filled 2020-07-17: qty 250

## 2020-07-17 MED ORDER — DEXMEDETOMIDINE HCL IN NACL 400 MCG/100ML IV SOLN
0.1000 ug/kg/h | INTRAVENOUS | Status: AC
  Administered 2020-07-18: .7 ug/kg/h via INTRAVENOUS
  Filled 2020-07-17: qty 100

## 2020-07-17 MED ORDER — GLUTARALDEHYDE 0.625% SOAKING SOLUTION
TOPICAL | Status: DC
  Filled 2020-07-17: qty 50

## 2020-07-17 MED ORDER — PHENYLEPHRINE HCL-NACL 20-0.9 MG/250ML-% IV SOLN
30.0000 ug/min | INTRAVENOUS | Status: AC
  Administered 2020-07-18: 15 ug/min via INTRAVENOUS
  Filled 2020-07-17: qty 250

## 2020-07-17 NOTE — Patient Instructions (Signed)
   Have nothing to eat or drink after midnight the night before surgery.  On the morning of surgery do not take any medications.

## 2020-07-17 NOTE — Anesthesia Preprocedure Evaluation (Addendum)
Anesthesia Evaluation  Patient identified by MRN, date of birth, ID band Patient awake    Reviewed: Allergy & Precautions, NPO status , Patient's Chart, lab work & pertinent test results  Airway Mallampati: II  TM Distance: >3 FB Neck ROM: Full    Dental  (+) Teeth Intact   Pulmonary neg pulmonary ROS, former smoker,    Pulmonary exam normal        Cardiovascular hypertension, Pt. on medications and Pt. on home beta blockers + CAD, + Past MI and + Cardiac Stents (2012)   Rhythm:Regular Rate:Normal     Neuro/Psych Depression    GI/Hepatic negative GI ROS, Neg liver ROS,   Endo/Other  negative endocrine ROS  Renal/GU Renal disease  negative genitourinary   Musculoskeletal negative musculoskeletal ROS (+)   Abdominal (+)  Abdomen: soft. Bowel sounds: normal.  Peds  Hematology negative hematology ROS (+)   Anesthesia Other Findings   Reproductive/Obstetrics                            Anesthesia Physical Anesthesia Plan  ASA: IV  Anesthesia Plan: General   Post-op Pain Management:    Induction: Intravenous  PONV Risk Score and Plan: 2 and Ondansetron, Dexamethasone, Midazolam and Treatment may vary due to age or medical condition  Airway Management Planned: Mask and Oral ETT  Additional Equipment: Arterial line, CVP, PA Cath, TEE, 3D TEE and Ultrasound Guidance Line Placement  Intra-op Plan:   Post-operative Plan: Possible Post-op intubation/ventilation  Informed Consent: I have reviewed the patients History and Physical, chart, labs and discussed the procedure including the risks, benefits and alternatives for the proposed anesthesia with the patient or authorized representative who has indicated his/her understanding and acceptance.     Dental advisory given  Plan Discussed with: CRNA  Anesthesia Plan Comments: (ECHO 06/27/20: Left ventricular ejection fraction, by  estimation, is 60 to 65%. The left ventricle has normal function. The left ventricle has no regional wall motion abnormalities. 2. Right ventricular systolic function is normal. The right ventricular size is normal. 3. No left atrial/left atrial appendage thrombus was detected. 4. Posterior mitral annulus calcification. Carpentier Class II (prolapse) severe mitral regurgitation directed anteriorly. Prolapse noted in the P2 segment. The mitral regurgitant jet is very eccentric and anteriorly directed, which can underestimate the MR TVI. There is no significant sytolic flow reversal in the pulmonary veins. Regurgitant volume is 95 mL. Regurgitant fraction 56%. ERO 86 mm^2. Mitral valve inflow E/A ratio is 1.4 (>1.2), all consistent with severe MR.. The mitral valve is normal in structure. Severe mitral valve regurgitation. No evidence of mitral stenosis. Moderate mitral annular calcification. 5. The aortic valve is normal in structure. Aortic valve regurgitation is not visualized. No aortic stenosis is present. 6. There is mild (Grade II) layered plaque involving the descending aorta.  Cath 11/21: 1. Nonobstructive CAD except for occlusion of a small ramus branch. The  stent in the distal RCA is widely patent. 2. Mildly elevated LV filling pressures. Moderate V wave noted on PCWP  tracing 3. Mild pulmonary HTN mean 22 mm Hg 4. Normal cardiac output.  Lab Results      Component                Value               Date  WBC                      7.1                 07/14/2020                HGB                      13.6                07/14/2020                HCT                      39.0                07/14/2020                MCV                      97.3                07/14/2020                PLT                      174                 07/14/2020           Lab Results      Component                Value               Date                      NA                        137                 07/14/2020                K                        3.4 (L)             07/14/2020                CO2                      20 (L)              07/14/2020                GLUCOSE                  129 (H)             07/14/2020                BUN                      13                  07/14/2020                CREATININE  1.14                07/14/2020                CALCIUM                  9.2                 07/14/2020                GFRNONAA                 >60                 07/14/2020                GFRAA                    59 (L)              06/07/2020          )       Anesthesia Quick Evaluation

## 2020-07-17 NOTE — H&P (Signed)
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 can underestimate  the MR TVI. There is no significant sytolic flow reversal in the pulmonary  veins. Regurgitant volume is 95 mL. Regurgitant  fraction 56%. ERO 86 mm^2.  Mitral valve inflow E/A ratio is 1.4 (>1.2), all consistent with severe  MR.. The mitral valve is normal  in structure. Severe mitral valve regurgitation. No evidence of mitral  stenosis. Moderate mitral annular calcification.  5. The aortic valve is normal in structure. Aortic valve regurgitation is  not visualized. No aortic stenosis is present.  6. There is mild (Grade II) layered plaque involving the descending  aorta.   FINDINGS  Left Ventricle: Left ventricular ejection fraction, by estimation, is 60  to 65%. The left ventricle has normal function. The left ventricle has no  regional wall motion abnormalities. The left ventricular internal cavity  size was normal in size. There is  no left ventricular hypertrophy.   Right Ventricle: The right ventricular size is normal. No increase in  right ventricular wall thickness. Right ventricular systolic function is  normal.   Left Atrium: Left atrial size was normal in size. No left atrial/left  atrial appendage thrombus was detected.   Right Atrium: Right atrial size was normal in size.   Pericardium: There is no evidence of pericardial effusion.   Mitral Valve: Posterior mitral annulus calcification. Carpentier Class II  (prolapse) severe mitral regurgitation directed anteriorly. Prolapse noted  in the P2 segment. The mitral regurgitant jet is very eccentric and  anteriorly directed, which can  underestimate the MR TVI. There is no significant sytolic flow reversal in  the pulmonary veins. Regurgitant volume is 95 mL. Regurgitant fraction  56%. ERO 86 mm^2. Mitral valve inflow E/A ratio is 1.4 (>1.2), all  consistent with severe MR. The mitral  valve is normal in structure. There is mild thickening of the mitral valve  leaflet(s). Moderate mitral annular calcification. Severe mitral valve  regurgitation, with anteriorly-directed jet. No evidence of mitral valve  stenosis.   Tricuspid Valve: The  tricuspid valve is normal in structure. Tricuspid  valve regurgitation is trivial. No evidence of tricuspid stenosis.   Aortic Valve: The aortic valve is normal in structure. Aortic valve  regurgitation is not visualized. No aortic stenosis is present.   Pulmonic Valve: The pulmonic valve was normal in structure. Pulmonic valve  regurgitation is not visualized. No evidence of pulmonic stenosis.   Aorta: The aortic root is normal in size and structure. There is mild  (Grade II) layered plaque involving the descending aorta.   IAS/Shunts: No atrial level shunt detected by color flow Doppler.     LEFT VENTRICLE  PLAX 2D  LVOT diam:   2.60 cm  LV SV:     74  LV SV Index:  34  LVOT Area:   5.31 cm     AORTIC VALVE  LVOT Vmax:  80.60 cm/s  LVOT Vmean: 43.000 cm/s  LVOT VTI:  0.139 m   MITRAL VALVE  MV Area (PHT): 2.19 cm    SHUNTS  MV Area VTI:  0.67 cm    Systemic VTI: 0.14 m  MV VTI:    1.10 m     Systemic Diam: 2.60 cm  MV Decel Time: 346 msec  MR Peak grad:   58.4 mmHg  MR Mean grad:   35.0 mmHg  MR Vmax:      382.00 cm/s  MR Vmean:     282.0 cm/s  MR Vena Contracta: 0.46 cm  MR PISA Nyquist:  0.3 m/s  MR PISA:      10.62 cm  MR PISA Radius:  1.30 cm  MV E velocity: 85.10 cm/s  MV A velocity: 61.10 cm/s  MV E/A ratio: 1.39   Skeet Latch MD  Electronically signed by Skeet Latch MD  Signature Date/Time: 06/28/2020/7:02:45 PM      CT ANGIOGRAPHY CHEST, ABDOMEN AND PELVIS  TECHNIQUE: Non-contrast CT of the chest was initially obtained.  Multidetector CT imaging through the chest, abdomen and pelvis was performed using the standard protocol during bolus administration of intravenous contrast. Multiplanar reconstructed images and MIPs were obtained and reviewed to evaluate the vascular anatomy.  CONTRAST: 43mL ISOVUE-370 IOPAMIDOL (ISOVUE-370) INJECTION 76%  COMPARISON:  None.  FINDINGS: CTA CHEST FINDINGS  Cardiovascular: Mild atherosclerotic changes of the thoracic aorta without dissection or aneurysm. Patent 2 vessel arch anatomy. No mediastinal hemorrhage or hematoma. Central  pulmonary arteries appear patent. Normal heart size. Mitral valve annulus calcifications noted. Coronary atherosclerosis present. No pericardial effusion.  Mediastinum/Nodes: 14 mm hypodense left inferior thyroid nodule present. Trachea and central airways are patent. Esophagus nondilated. No adenopathy.  Lungs/Pleura: Lungs are clear. No pleural effusion or pneumothorax.  Musculoskeletal: Degenerative changes of the thoracic spine. No acute compression fracture. Intact sternum. No focal osseous abnormality. No chest wall soft tissue abnormality or asymmetry.  Review of the MIP images confirms the above findings.  CTA ABDOMEN AND PELVIS FINDINGS  VASCULAR  Aorta: Abdominal atherosclerosis noted without aneurysm, dissection, stenosis, or occlusive process. No retroperitoneal hemorrhage, hematoma, or evidence of rupture.  Celiac: Atherosclerotic origin but remains patent including its branches.  SMA: Atherosclerotic origin but remains patent including its branches.  Renals: Atherosclerotic origins but remain patent. There is a patent accessory renal artery to the right kidney upper pole.  IMA: Calcified origin but appears to remain patent off the distal aorta including its branches.  Inflow: Iliac atherosclerosis and tortuosity noted but no inflow disease or occlusion. Common, internal and external iliac arteries remain patent. Negative for iliac aneurysm, dissection, occlusive process, or severe stenosis.  Proximal outflow: Visualized common femoral, proximal profunda femoral, proximal superficial femoral arteries demonstrate nonocclusive atherosclerotic change and appear widely patent.  Veins: Dedicated venous phase imaging not  performed.  Review of the MIP images confirms the above findings.  NON-VASCULAR  Hepatobiliary: Limited arterial phase imaging only. No large focal hepatic abnormality or intrahepatic biliary dilatation. Gallbladder collapsed. Common bile duct nondilated.  Pancreas: Unremarkable. No pancreatic ductal dilatation or surrounding inflammatory changes.  Spleen: Normal in size without focal abnormality.  Adrenals/Urinary Tract: Normal adrenal glands. No renal obstruction or hydronephrosis. Mild cortical thinning bilaterally. Suspect bilateral extrarenal pelvises. No hydroureter or ureteral calculus. Bladder is collapsed.  Stomach/Bowel: Negative for bowel obstruction, significant dilatation, ileus, free air. There are a few scattered distal colon diverticula without acute inflammatory process.  No free fluid, fluid collection, hemorrhage, hematoma, abscess or ascites.  Lymphatic: No bulky adenopathy.  Reproductive: Prostate gland is enlarged.  Other: No abdominal wall hernia or abnormality. No abdominopelvic ascites.  Musculoskeletal: Degenerative changes of the spine. Lower lumbar facet arthropathy. No acute osseous finding.  Review of the MIP images confirms the above findings.  IMPRESSION: Mild thoracic aorta atherosclerotic change without dissection, aneurysm, or other acute vascular process.  Native coronary atherosclerosis  Mitral valve annulus calcification  No other acute intrathoracic finding.  14 mm left inferior thyroid nodule. Not clinically significant; no follow-up imaging recommended (ref: J Am Coll Radiol. 2015 Feb;12(2): 143-50).  Nonocclusive abdominopelvic atherosclerosis without aortoiliac occlusive disease as detailed above. Mesenteric and renal vasculature all appear patent. Mild iliac atherosclerotic change and tortuosity without inflow disease or occlusion.  No other acute intra-abdominopelvic  finding.   Electronically Signed By: Jerilynn Mages. Shick M.D. On: 07/07/2020 09:59   Impression:  Patient has mitral valve prolapse with stage C severe asymptomatic primary mitral regurgitation. He complains of decreased energy but overall specifically denies any symptoms of exertional shortness of breath or chest discomfort.  I have personally reviewed the patient's recent transthoracic and transesophageal echocardiograms, diagnostic cardiac catheterization and CT angiogram. 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 There is some leaflet thickening, fibrosis, and possible calcification near the posterior annulus as well. There were ruptured primary chordae tendinaewith severe mitral regurgitation. Left ventricular systolic function remains normal. Diagnostic cardiac catheterization reveals continued patency of stent  in the right coronary artery with otherwise nonobstructive coronary artery disease. There was mild pulmonary hypertension.  CT angiogram of the chest, abdomen, and pelvis revealed no contraindications to peripheral cannulation for surgery although the patient does have very mild nonobstructive atherosclerotic plaque in the descending aorta and femoral vessels.  Options include continued close observation on medical therapy versus elective mitral valve repair. Based upon review of the patient's transesophageal echocardiograms I feel there is a high likelihood that his valve should be repairable, although the potential need for valve replacement may be somewhat higher due to the presence of some leaflet and posterior annular calcification. Risks associated with surgery should be quite low. The patient appears to be an acceptable candidate for minimally invasive approach for surgery.   Plan:  The patientand his wife were again bothcounseled at length regarding  diagnosis of severe primary mitral regurgitation. We reviewed the results of their diagnostic tests including images from the most recent echocardiogram. We discussed the natural history of mitral regurgitation as well as alternative treatment strategies. We discussed the impact of age, current state of health, and any significant comorbid medical problems on clinical decision making. We went on to discuss the indications, risks and potential benefits of mitral valve repair as well as the timing of surgical intervention. The rationale for elective surgery has been explained, including a comparison between surgery and continued medical therapy with close follow-up. The likelihood of successful and durable mitral valve repair has been discussed with particular reference to the findings of the most recent echocardiogram. Based upon these findings and previous experience, I have quoted a greater than 95percent likelihood of successful valve repair with less than 1percent risk of mortality or major morbidity. Alternative surgical approaches have been discussed including a comparison between conventional sternotomy and minimally-invasive techniques. The relative risks and benefits of each have been reviewed as they pertain to the patient's specific circumstances, and expectations for the patient's postoperative convalescence has been discussed.  The patient desires to proceed with surgery tomorrow as previously planned.  The patient understands and accepts all potential risks of surgery including but not limited to risk of death, stroke or other neurologic complication, myocardial infarction, congestive heart failure, respiratory failure, renal failure, bleeding requiring transfusion and/or reexploration, arrhythmia, heart block or bradycardia requiring permanent pacemaker insertion, infection or other wound complications, pneumonia, pleural and/or pericardial effusion, pulmonary embolus, aortic dissection  or other major vascular complication, or other immediate or delayed complications related to valve repair or replacement including but not limited to recurrent or persistent mitral regurgitation and/or mitral stenosis, LV outflow tract obstruction, aortic insufficiency, paravalvular leak, posterior AV groove disruption, structural valve deterioration and failure, thrombosis, embolization, or endocarditis.  Specific risks potentially related to the minimally-invasive approach were discussed at length, including but not limited to risk of conversion to full or partial sternotomy, aortic dissection or other major vascular complication, unilateral acute lung injury or pulmonary edema, phrenic nerve dysfunction or paralysis, rib fracture, chronic pain, lung hernia, or lymphocele. All of their questions have been answered.       Valentina Gu. Roxy Manns, MD 07/17/2020 4:44 PM

## 2020-07-17 NOTE — Progress Notes (Signed)
[address]       [place] [address]             [phone]     CARDIOTHORACIC SURGERY OFFICE NOTE  Referring Provider is [name], [name], MD PCP is Ria Bush, MD   HPI:   [name] with history of coronary artery disease, mitral valve prolapse with mitral regurgitation, hypertension, and hyperlipidemia who returns to the office today with plans to proceed with elective mitral valve repair tomorrow.  He was originally seen in consultation on July 03, 2020.  He reports no new problems or complaints over the last couple of weeks and he is eager to proceed with elective surgical intervention.  He specifically denies any symptoms of shortness of breath, chest discomfort, fever, chills, productive cough.   Current Outpatient Medications  Medication Sig Dispense Refill   amLODipine (NORVASC) 5 MG tablet Take 1 tablet (5 mg total) by mouth daily. (Patient taking differently: Take 5 mg by mouth every evening.) 90 tablet 3   aspirin EC 81 MG tablet Take 81 mg by mouth every evening. Swallow whole.     atorvastatin (LIPITOR) 80 MG tablet Take 1 tablet (80 mg total) by mouth daily. (Patient taking differently: Take 80 mg by mouth every evening.) 90 tablet 3   carvedilol (COREG) 12.5 MG tablet Take 1 tablet (12.5 mg total) by mouth 2 (two) times daily. 180 tablet 3   Cholecalciferol (VITAMIN D3) 1000 units CAPS Take 1 capsule (1,000 Units total) by mouth daily. (Patient taking differently: Take 1,000 Units by mouth every evening.) 30 capsule    glucose blood test strip Use to test sugar daily AS NEEDED 30 each 3   vitamin B-12 (CYANOCOBALAMIN) 1000 MCG tablet Take 1,000 mcg by mouth every evening.     nitroGLYCERIN (NITROSTAT) 0.4 MG SL tablet Place 1 tablet (0.4 mg total) under the tongue every 5 (five) minutes as needed for chest pain. 25 tablet 11   Current Facility-Administered Medications  Medication Dose Route Frequency  Provider Last Rate Last Admin   0.9 %  sodium chloride infusion  500 mL Intravenous Once Armbruster, Carlota Raspberry, MD       sodium chloride flush (NS) 0.9 % injection 3 mL  3 mL Intravenous Q12H [name], [name], MD       Facility-Administered Medications Ordered in Other Visits  Medication Dose Route Frequency Provider Last Rate Last Admin   [START ON 07/18/2020] cefUROXime (ZINACEF) 1.5 g in sodium chloride 0.9 % 100 mL IVPB  1.5 g Intravenous To OR [name], MD       [START ON 07/18/2020] cefUROXime (ZINACEF) 750 mg in sodium chloride 0.9 % 100 mL IVPB  750 mg Intravenous To OR [name], MD       [START ON 07/18/2020] dexmedetomidine (PRECEDEX) 400 MCG/100ML (4 mcg/mL) infusion  0.1-0.7 mcg/kg/hr Intravenous To OR [name], MD       [START ON 07/18/2020] EPINEPHrine (ADRENALIN) 4 mg in NS 250 mL (0.016 mg/mL) premix infusion  0-10 mcg/min Intravenous To OR [name], MD       [START ON 07/18/2020] glutaraldehyde 0.625% cardiac soaking solution   Topical On Call to [place], MD       [START ON 07/18/2020] heparin 30,000 units/NS 1000 mL solution for CELLSAVER   Other To OR [name], MD       [START ON 07/18/2020] heparin sodium (porcine) 2,500  Units, papaverine 30 mg in electrolyte-148 (PLASMALYTE-148) 500 mL irrigation   Irrigation To OR [name], MD       [START ON 07/18/2020] insulin regular, human (MYXREDLIN) 100 units/ 100 mL infusion   Intravenous To OR [name], MD       [facility] Blood Cardioplegia vial (lidocaine/magnesium/mannitol 8.18E-9H-3.7J)   Intracoronary Once [name], MD       [START ON 07/18/2020] milrinone (PRIMACOR) 20 MG/100 ML (0.2 mg/mL) infusion  0.3 mcg/kg/min Intravenous To OR [name], MD       [START ON 07/18/2020] nitroGLYCERIN 50 mg in dextrose 5 % 250 mL (0.2 mg/mL) infusion  2-200 mcg/min Intravenous To OR [name], MD       [START ON 07/18/2020] norepinephrine (LEVOPHED) 4mg  in 243mL  premix infusion  0-40 mcg/min Intravenous To OR [name], MD       [START ON 07/18/2020] phenylephrine (NEOSYNEPHRINE) 20-0.9 MG/250ML-% infusion  30-200 mcg/min Intravenous To OR [name], MD       [START ON 07/18/2020] potassium chloride injection 80 mEq  80 mEq Other To OR [name], MD       [START ON 07/18/2020] tranexamic acid (CYKLOKAPRON) 2,500 mg in sodium chloride 0.9 % 250 mL (10 mg/mL) infusion  1.5 mg/kg/hr Intravenous To OR [name], MD       [START ON 07/18/2020] tranexamic acid (CYKLOKAPRON) bolus via infusion - over 30 minutes 1,497 mg  15 mg/kg Intravenous To OR [name], MD       [START ON 07/18/2020] tranexamic acid (CYKLOKAPRON) pump prime solution 200 mg  2 mg/kg Intracatheter To OR [name], MD       [START ON 07/18/2020] vancomycin (VANCOCIN) 1,000 mg in sodium chloride 0.9 % 1,000 mL irrigation   Irrigation To OR [name], MD       [START ON 07/18/2020] vancomycin (VANCOREADY) IVPB 1500 mg/300 mL  1,500 mg Intravenous To OR [name], MD          Physical Exam:   BP (!) 142/47 (BP Location: Right Arm, Patient Position: Sitting, Cuff Size: Normal)    Pulse 79    Temp 99.5 F (37.5 C) (Skin)    Resp 20    Ht 5\' 10"  (1.778 m)    Wt 219 lb 8 oz (99.6 kg)    SpO2 95% Comment: RA   BMI 31.49 kg/m   General:  Well-appearing  Chest:   Clear to auscultation with symmetrical breath sounds  CV:   Regular rate and rhythm with prominent holosystolic murmur  Incisions:  n/a  Abdomen:  Soft nontender  Extremities:  Warm and well-perfused  Diagnostic Tests:  CT ANGIOGRAPHY CHEST, ABDOMEN AND PELVIS  TECHNIQUE: Non-contrast CT of the chest was initially obtained.  Multidetector CT imaging through the chest, abdomen and pelvis was performed using the standard protocol during bolus administration of intravenous contrast. Multiplanar reconstructed images and MIPs were obtained and reviewed to evaluate the vascular  anatomy.  CONTRAST:  73mL ISOVUE-370 IOPAMIDOL (ISOVUE-370) INJECTION 76%  COMPARISON:  None.  FINDINGS: CTA CHEST FINDINGS  Cardiovascular: Mild atherosclerotic changes of the thoracic aorta without dissection or aneurysm. Patent 2 vessel arch anatomy. No mediastinal hemorrhage or hematoma. Central pulmonary arteries appear patent. Normal heart size. Mitral valve annulus calcifications noted. Coronary atherosclerosis present. No pericardial effusion.  Mediastinum/Nodes: 14 mm hypodense left inferior thyroid nodule present. Trachea and central airways are patent. Esophagus nondilated. No  adenopathy.  Lungs/Pleura: Lungs are clear. No pleural effusion or pneumothorax.  Musculoskeletal: Degenerative changes of the thoracic spine. No acute compression fracture. Intact sternum. No focal osseous abnormality. No chest wall soft tissue abnormality or asymmetry.  Review of the MIP images confirms the above findings.  CTA ABDOMEN AND PELVIS FINDINGS  VASCULAR  Aorta: Abdominal atherosclerosis noted without aneurysm, dissection, stenosis, or occlusive process. No retroperitoneal hemorrhage, hematoma, or evidence of rupture.  Celiac: Atherosclerotic origin but remains patent including its branches.  SMA: Atherosclerotic origin but remains patent including its branches.  Renals: Atherosclerotic origins but remain patent. There is a patent accessory renal artery to the right kidney upper pole.  IMA: Calcified origin but appears to remain patent off the distal aorta including its branches.  Inflow: Iliac atherosclerosis and tortuosity noted but no inflow disease or occlusion. Common, internal and external iliac arteries remain patent. Negative for iliac aneurysm, dissection, occlusive process, or severe stenosis.  Proximal outflow: Visualized common femoral, proximal profunda femoral, proximal superficial femoral arteries demonstrate nonocclusive  atherosclerotic change and appear widely patent.  Veins: Dedicated venous phase imaging not performed.  Review of the MIP images confirms the above findings.  NON-VASCULAR  Hepatobiliary: Limited arterial phase imaging only. No large focal hepatic abnormality or intrahepatic biliary dilatation. Gallbladder collapsed. Common bile duct nondilated.  Pancreas: Unremarkable. No pancreatic ductal dilatation or surrounding inflammatory changes.  Spleen: Normal in size without focal abnormality.  Adrenals/Urinary Tract: Normal adrenal glands. No renal obstruction or hydronephrosis. Mild cortical thinning bilaterally. Suspect bilateral extrarenal pelvises. No hydroureter or ureteral calculus. Bladder is collapsed.  Stomach/Bowel: Negative for bowel obstruction, significant dilatation, ileus, free air. There are a few scattered distal colon diverticula without acute inflammatory process.  No free fluid, fluid collection, hemorrhage, hematoma, abscess or ascites.  Lymphatic: No bulky adenopathy.  Reproductive: Prostate gland is enlarged.  Other: No abdominal wall hernia or abnormality. No abdominopelvic ascites.  Musculoskeletal: Degenerative changes of the spine. Lower lumbar facet arthropathy. No acute osseous finding.  Review of the MIP images confirms the above findings.  IMPRESSION: Mild thoracic aorta atherosclerotic change without dissection, aneurysm, or other acute vascular process.  Native coronary atherosclerosis  Mitral valve annulus calcification  No other acute intrathoracic finding.  14 mm left inferior thyroid nodule. Not clinically significant; no follow-up imaging recommended (ref: J Am Coll Radiol. 2015 Feb;12(2): 143-50).  Nonocclusive abdominopelvic atherosclerosis without aortoiliac occlusive disease as detailed above. Mesenteric and renal vasculature all appear patent. Mild iliac atherosclerotic change and tortuosity without  inflow disease or occlusion.  No other acute intra-abdominopelvic finding.   Electronically Signed   By: Jerilynn Mages.  Shick M.D.   On: 07/07/2020 09:59   Impression:  Patient has mitral valve prolapse with stage C severe asymptomatic primary mitral regurgitation.  He complains of decreased energy but overall specifically denies any symptoms of exertional shortness of breath or chest discomfort.  I have personally reviewed the patient's recent transthoracic and transesophageal echocardiograms, diagnostic cardiac catheterization and CT angiogram.  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  There is some leaflet thickening, fibrosis, and possible calcification near the posterior annulus as well.  There were ruptured primary chordae tendinae with severe mitral regurgitation.  Left ventricular systolic function remains normal.  Diagnostic cardiac catheterization reveals continued patency of stent in the right coronary artery with otherwise nonobstructive coronary artery disease.  There was mild pulmonary hypertension.  CT angiogram of the chest, abdomen, and  pelvis revealed no contraindications to peripheral cannulation for surgery although the patient does have very mild nonobstructive atherosclerotic plaque in the descending aorta and femoral vessels.  Options include continued close observation on medical therapy versus elective mitral valve repair.  Based upon review of the patient's transesophageal echocardiograms I feel there is a high likelihood that his valve should be repairable, although the potential need for valve replacement may be somewhat higher due to the presence of some leaflet and posterior annular calcification.  Risks associated with surgery should be quite low.  The patient appears to be an acceptable candidate for minimally invasive approach for surgery.   Plan:  The  patient and his wife were again both counseled at length regarding diagnosis of severe primary mitral regurgitation.  We reviewed the results of their diagnostic tests including images from the most recent echocardiogram.  We discussed the natural history of mitral regurgitation as well as alternative treatment strategies.  We discussed the impact of age, current state of health, and any significant comorbid medical problems on clinical decision making.  We went on to discuss the indications, risks and potential benefits of mitral valve repair as well as the timing of surgical intervention.  The rationale for elective surgery has been explained, including a comparison between surgery and continued medical therapy with close follow-up.  The likelihood of successful and durable mitral valve repair has been discussed with particular reference to the findings of the most recent echocardiogram.  Based upon these findings and previous experience, I have quoted a greater than 95 percent likelihood of successful valve repair with less than 1 percent risk of mortality or major morbidity.  Alternative surgical approaches have been discussed including a comparison between conventional sternotomy and minimally-invasive techniques.  The relative risks and benefits of each have been reviewed as they pertain to the patient's specific circumstances, and expectations for the patient's postoperative convalescence has been discussed.  The patient desires to proceed with surgery tomorrow as previously planned.  The patient understands and accepts all potential risks of surgery including but not limited to risk of death, stroke or other neurologic complication, myocardial infarction, congestive heart failure, respiratory failure, renal failure, bleeding requiring transfusion and/or reexploration, arrhythmia, heart block or bradycardia requiring permanent pacemaker insertion, infection or other wound complications, pneumonia, pleural  and/or pericardial effusion, pulmonary embolus, aortic dissection or other major vascular complication, or other immediate or delayed complications related to valve repair or replacement including but not limited to recurrent or persistent mitral regurgitation and/or mitral stenosis, LV outflow tract obstruction, aortic insufficiency, paravalvular leak, posterior AV groove disruption, structural valve deterioration and failure, thrombosis, embolization, or endocarditis.  Specific risks potentially related to the minimally-invasive approach were discussed at length, including but not limited to risk of conversion to full or partial sternotomy, aortic dissection or other major vascular complication, unilateral acute lung injury or pulmonary edema, phrenic nerve dysfunction or paralysis, rib fracture, chronic pain, lung hernia, or lymphocele. All of their questions have been answered.    I spent in excess of 15 minutes during the conduct of this office consultation and >50% of this time involved direct face-to-face encounter with the patient for counseling and/or coordination of their care.    Valentina Gu. Roxy Manns, MD 07/17/2020 4:44 PM

## 2020-07-18 ENCOUNTER — Encounter (HOSPITAL_COMMUNITY)
Admission: RE | Disposition: A | Discharge status: Home / Self Care | Payer: Self-pay | Source: Ambulatory Visit | Attending: Thoracic Surgery (Cardiothoracic Vascular Surgery)

## 2020-07-18 ENCOUNTER — Other Ambulatory Visit: Payer: Self-pay

## 2020-07-18 ENCOUNTER — Inpatient Hospital Stay (HOSPITAL_COMMUNITY)
Admission: RE | Admit: 2020-07-18 | Discharge: 2020-07-23 | DRG: 219 | Disposition: A | Discharge status: Home / Self Care | Payer: PPO | Source: Ambulatory Visit | Attending: Thoracic Surgery (Cardiothoracic Vascular Surgery) | Admitting: Thoracic Surgery (Cardiothoracic Vascular Surgery)

## 2020-07-18 ENCOUNTER — Encounter (HOSPITAL_COMMUNITY): Payer: Self-pay | Admitting: Thoracic Surgery (Cardiothoracic Vascular Surgery)

## 2020-07-18 ENCOUNTER — Inpatient Hospital Stay (HOSPITAL_COMMUNITY): Payer: PPO | Admitting: Anesthesiology

## 2020-07-18 ENCOUNTER — Inpatient Hospital Stay (HOSPITAL_COMMUNITY): Payer: PPO

## 2020-07-18 ENCOUNTER — Inpatient Hospital Stay (HOSPITAL_COMMUNITY): Payer: PPO | Admitting: Vascular Surgery

## 2020-07-18 DIAGNOSIS — Z87891 Personal history of nicotine dependence: Secondary | ICD-10-CM | POA: Diagnosis not present

## 2020-07-18 DIAGNOSIS — R7303 Prediabetes: Secondary | ICD-10-CM | POA: Diagnosis present

## 2020-07-18 DIAGNOSIS — Z888 Allergy status to other drugs, medicaments and biological substances status: Secondary | ICD-10-CM

## 2020-07-18 DIAGNOSIS — Z7982 Long term (current) use of aspirin: Secondary | ICD-10-CM | POA: Diagnosis not present

## 2020-07-18 DIAGNOSIS — R918 Other nonspecific abnormal finding of lung field: Secondary | ICD-10-CM | POA: Diagnosis not present

## 2020-07-18 DIAGNOSIS — E559 Vitamin D deficiency, unspecified: Secondary | ICD-10-CM | POA: Diagnosis not present

## 2020-07-18 DIAGNOSIS — Z955 Presence of coronary angioplasty implant and graft: Secondary | ICD-10-CM

## 2020-07-18 DIAGNOSIS — Z79899 Other long term (current) drug therapy: Secondary | ICD-10-CM

## 2020-07-18 DIAGNOSIS — R410 Disorientation, unspecified: Secondary | ICD-10-CM | POA: Diagnosis not present

## 2020-07-18 DIAGNOSIS — I129 Hypertensive chronic kidney disease with stage 1 through stage 4 chronic kidney disease, or unspecified chronic kidney disease: Secondary | ICD-10-CM | POA: Diagnosis not present

## 2020-07-18 DIAGNOSIS — J9811 Atelectasis: Secondary | ICD-10-CM | POA: Diagnosis not present

## 2020-07-18 DIAGNOSIS — I252 Old myocardial infarction: Secondary | ICD-10-CM | POA: Diagnosis not present

## 2020-07-18 DIAGNOSIS — Z952 Presence of prosthetic heart valve: Secondary | ICD-10-CM | POA: Diagnosis not present

## 2020-07-18 DIAGNOSIS — F32A Depression, unspecified: Secondary | ICD-10-CM | POA: Diagnosis not present

## 2020-07-18 DIAGNOSIS — E785 Hyperlipidemia, unspecified: Secondary | ICD-10-CM | POA: Diagnosis not present

## 2020-07-18 DIAGNOSIS — Z09 Encounter for follow-up examination after completed treatment for conditions other than malignant neoplasm: Secondary | ICD-10-CM

## 2020-07-18 DIAGNOSIS — I272 Pulmonary hypertension, unspecified: Secondary | ICD-10-CM | POA: Diagnosis present

## 2020-07-18 DIAGNOSIS — I340 Nonrheumatic mitral (valve) insufficiency: Principal | ICD-10-CM | POA: Diagnosis present

## 2020-07-18 DIAGNOSIS — Z85820 Personal history of malignant melanoma of skin: Secondary | ICD-10-CM

## 2020-07-18 DIAGNOSIS — Z8249 Family history of ischemic heart disease and other diseases of the circulatory system: Secondary | ICD-10-CM

## 2020-07-18 DIAGNOSIS — R443 Hallucinations, unspecified: Secondary | ICD-10-CM | POA: Diagnosis not present

## 2020-07-18 DIAGNOSIS — I10 Essential (primary) hypertension: Secondary | ICD-10-CM | POA: Diagnosis present

## 2020-07-18 DIAGNOSIS — Z9889 Other specified postprocedural states: Secondary | ICD-10-CM

## 2020-07-18 DIAGNOSIS — I2510 Atherosclerotic heart disease of native coronary artery without angina pectoris: Secondary | ICD-10-CM | POA: Diagnosis not present

## 2020-07-18 DIAGNOSIS — I4891 Unspecified atrial fibrillation: Secondary | ICD-10-CM | POA: Diagnosis present

## 2020-07-18 DIAGNOSIS — G4733 Obstructive sleep apnea (adult) (pediatric): Secondary | ICD-10-CM | POA: Diagnosis not present

## 2020-07-18 DIAGNOSIS — R451 Restlessness and agitation: Secondary | ICD-10-CM | POA: Diagnosis not present

## 2020-07-18 DIAGNOSIS — I341 Nonrheumatic mitral (valve) prolapse: Secondary | ICD-10-CM

## 2020-07-18 DIAGNOSIS — H9190 Unspecified hearing loss, unspecified ear: Secondary | ICD-10-CM | POA: Diagnosis not present

## 2020-07-18 DIAGNOSIS — R531 Weakness: Secondary | ICD-10-CM | POA: Diagnosis not present

## 2020-07-18 DIAGNOSIS — I517 Cardiomegaly: Secondary | ICD-10-CM | POA: Diagnosis not present

## 2020-07-18 DIAGNOSIS — N183 Chronic kidney disease, stage 3 unspecified: Secondary | ICD-10-CM | POA: Diagnosis not present

## 2020-07-18 DIAGNOSIS — D62 Acute posthemorrhagic anemia: Secondary | ICD-10-CM | POA: Diagnosis not present

## 2020-07-18 DIAGNOSIS — N170 Acute kidney failure with tubular necrosis: Secondary | ICD-10-CM | POA: Diagnosis not present

## 2020-07-18 DIAGNOSIS — I088 Other rheumatic multiple valve diseases: Secondary | ICD-10-CM | POA: Diagnosis not present

## 2020-07-18 DIAGNOSIS — I700 Atherosclerosis of aorta: Secondary | ICD-10-CM | POA: Diagnosis present

## 2020-07-18 DIAGNOSIS — J984 Other disorders of lung: Secondary | ICD-10-CM | POA: Diagnosis not present

## 2020-07-18 HISTORY — DX: Other specified postprocedural states: Z98.890

## 2020-07-18 HISTORY — PX: MITRAL VALVE REPAIR: SHX2039

## 2020-07-18 HISTORY — PX: TEE WITHOUT CARDIOVERSION: SHX5443

## 2020-07-18 LAB — POCT I-STAT 7, (LYTES, BLD GAS, ICA,H+H)
Acid-Base Excess: 0 mmol/L (ref 0.0–2.0)
Acid-Base Excess: 2 mmol/L (ref 0.0–2.0)
Acid-Base Excess: 2 mmol/L (ref 0.0–2.0)
Acid-Base Excess: 3 mmol/L — ABNORMAL HIGH (ref 0.0–2.0)
Acid-Base Excess: 0 mmol/L (ref 0.0–2.0)
Acid-base deficit: 2 mmol/L (ref 0.0–2.0)
Bicarbonate: 24.4 mmol/L (ref 20.0–28.0)
Bicarbonate: 27.2 mmol/L (ref 20.0–28.0)
Bicarbonate: 26.3 mmol/L (ref 20.0–28.0)
Bicarbonate: 26.7 mmol/L (ref 20.0–28.0)
Bicarbonate: 24.6 mmol/L (ref 20.0–28.0)
Bicarbonate: 23.2 mmol/L (ref 20.0–28.0)
Calcium, Ion: 1.25 mmol/L (ref 1.15–1.40)
Calcium, Ion: 1.03 mmol/L — ABNORMAL LOW (ref 1.15–1.40)
Calcium, Ion: 1.04 mmol/L — ABNORMAL LOW (ref 1.15–1.40)
Calcium, Ion: 1 mmol/L — ABNORMAL LOW (ref 1.15–1.40)
Calcium, Ion: 1 mmol/L — ABNORMAL LOW (ref 1.15–1.40)
Calcium, Ion: 1.04 mmol/L — ABNORMAL LOW (ref 1.15–1.40)
HCT: 36 % — ABNORMAL LOW (ref 39.0–52.0)
HCT: 26 % — ABNORMAL LOW (ref 39.0–52.0)
HCT: 23 % — ABNORMAL LOW (ref 39.0–52.0)
HCT: 22 % — ABNORMAL LOW (ref 39.0–52.0)
HCT: 23 % — ABNORMAL LOW (ref 39.0–52.0)
HCT: 27 % — ABNORMAL LOW (ref 39.0–52.0)
Hemoglobin: 12.2 g/dL — ABNORMAL LOW (ref 13.0–17.0)
Hemoglobin: 8.8 g/dL — ABNORMAL LOW (ref 13.0–17.0)
Hemoglobin: 7.8 g/dL — ABNORMAL LOW (ref 13.0–17.0)
Hemoglobin: 7.5 g/dL — ABNORMAL LOW (ref 13.0–17.0)
Hemoglobin: 7.8 g/dL — ABNORMAL LOW (ref 13.0–17.0)
Hemoglobin: 9.2 g/dL — ABNORMAL LOW (ref 13.0–17.0)
O2 Saturation: 100 %
O2 Saturation: 100 %
O2 Saturation: 100 %
O2 Saturation: 100 %
O2 Saturation: 100 %
O2 Saturation: 99 %
Patient temperature: 35.5
Potassium: 3.1 mmol/L — ABNORMAL LOW (ref 3.5–5.1)
Potassium: 3.7 mmol/L (ref 3.5–5.1)
Potassium: 3.9 mmol/L (ref 3.5–5.1)
Potassium: 4.4 mmol/L (ref 3.5–5.1)
Potassium: 3.8 mmol/L (ref 3.5–5.1)
Potassium: 3.8 mmol/L (ref 3.5–5.1)
Sodium: 140 mmol/L (ref 135–145)
Sodium: 140 mmol/L (ref 135–145)
Sodium: 140 mmol/L (ref 135–145)
Sodium: 140 mmol/L (ref 135–145)
Sodium: 141 mmol/L (ref 135–145)
Sodium: 141 mmol/L (ref 135–145)
TCO2: 26 mmol/L (ref 22–32)
TCO2: 29 mmol/L (ref 22–32)
TCO2: 27 mmol/L (ref 22–32)
TCO2: 28 mmol/L (ref 22–32)
TCO2: 26 mmol/L (ref 22–32)
TCO2: 24 mmol/L (ref 22–32)
pCO2 arterial: 39.9 mmHg (ref 32.0–48.0)
pCO2 arterial: 45.5 mmHg (ref 32.0–48.0)
pCO2 arterial: 38.2 mmHg (ref 32.0–48.0)
pCO2 arterial: 38 mmHg (ref 32.0–48.0)
pCO2 arterial: 38.1 mmHg (ref 32.0–48.0)
pCO2 arterial: 38.1 mmHg (ref 32.0–48.0)
pH, Arterial: 7.396 (ref 7.350–7.450)
pH, Arterial: 7.385 (ref 7.350–7.450)
pH, Arterial: 7.447 (ref 7.350–7.450)
pH, Arterial: 7.454 — ABNORMAL HIGH (ref 7.350–7.450)
pH, Arterial: 7.418 (ref 7.350–7.450)
pH, Arterial: 7.386 (ref 7.350–7.450)
pO2, Arterial: 178 mmHg — ABNORMAL HIGH (ref 83.0–108.0)
pO2, Arterial: 474 mmHg — ABNORMAL HIGH (ref 83.0–108.0)
pO2, Arterial: 372 mmHg — ABNORMAL HIGH (ref 83.0–108.0)
pO2, Arterial: 407 mmHg — ABNORMAL HIGH (ref 83.0–108.0)
pO2, Arterial: 324 mmHg — ABNORMAL HIGH (ref 83.0–108.0)
pO2, Arterial: 164 mmHg — ABNORMAL HIGH (ref 83.0–108.0)

## 2020-07-18 LAB — POCT I-STAT, CHEM 8
BUN: 13 mg/dL (ref 8–23)
BUN: 12 mg/dL (ref 8–23)
BUN: 12 mg/dL (ref 8–23)
BUN: 11 mg/dL (ref 8–23)
BUN: 12 mg/dL (ref 8–23)
BUN: 12 mg/dL (ref 8–23)
Calcium, Ion: 1.29 mmol/L (ref 1.15–1.40)
Calcium, Ion: 1.26 mmol/L (ref 1.15–1.40)
Calcium, Ion: 1.09 mmol/L — ABNORMAL LOW (ref 1.15–1.40)
Calcium, Ion: 1.03 mmol/L — ABNORMAL LOW (ref 1.15–1.40)
Calcium, Ion: 0.95 mmol/L — ABNORMAL LOW (ref 1.15–1.40)
Calcium, Ion: 1 mmol/L — ABNORMAL LOW (ref 1.15–1.40)
Chloride: 103 mmol/L (ref 98–111)
Chloride: 103 mmol/L (ref 98–111)
Chloride: 102 mmol/L (ref 98–111)
Chloride: 102 mmol/L (ref 98–111)
Chloride: 102 mmol/L (ref 98–111)
Chloride: 103 mmol/L (ref 98–111)
Creatinine, Ser: 1.2 mg/dL (ref 0.61–1.24)
Creatinine, Ser: 1.1 mg/dL (ref 0.61–1.24)
Creatinine, Ser: 0.9 mg/dL (ref 0.61–1.24)
Creatinine, Ser: 0.9 mg/dL (ref 0.61–1.24)
Creatinine, Ser: 0.9 mg/dL (ref 0.61–1.24)
Creatinine, Ser: 1 mg/dL (ref 0.61–1.24)
Glucose, Bld: 108 mg/dL — ABNORMAL HIGH (ref 70–99)
Glucose, Bld: 142 mg/dL — ABNORMAL HIGH (ref 70–99)
Glucose, Bld: 122 mg/dL — ABNORMAL HIGH (ref 70–99)
Glucose, Bld: 101 mg/dL — ABNORMAL HIGH (ref 70–99)
Glucose, Bld: 92 mg/dL (ref 70–99)
Glucose, Bld: 95 mg/dL (ref 70–99)
HCT: 36 % — ABNORMAL LOW (ref 39.0–52.0)
HCT: 32 % — ABNORMAL LOW (ref 39.0–52.0)
HCT: 26 % — ABNORMAL LOW (ref 39.0–52.0)
HCT: 20 % — ABNORMAL LOW (ref 39.0–52.0)
HCT: 21 % — ABNORMAL LOW (ref 39.0–52.0)
HCT: 22 % — ABNORMAL LOW (ref 39.0–52.0)
Hemoglobin: 12.2 g/dL — ABNORMAL LOW (ref 13.0–17.0)
Hemoglobin: 10.9 g/dL — ABNORMAL LOW (ref 13.0–17.0)
Hemoglobin: 8.8 g/dL — ABNORMAL LOW (ref 13.0–17.0)
Hemoglobin: 6.8 g/dL — CL (ref 13.0–17.0)
Hemoglobin: 7.1 g/dL — ABNORMAL LOW (ref 13.0–17.0)
Hemoglobin: 7.5 g/dL — ABNORMAL LOW (ref 13.0–17.0)
Potassium: 3.1 mmol/L — ABNORMAL LOW (ref 3.5–5.1)
Potassium: 3.6 mmol/L (ref 3.5–5.1)
Potassium: 4.1 mmol/L (ref 3.5–5.1)
Potassium: 3.9 mmol/L (ref 3.5–5.1)
Potassium: 3.8 mmol/L (ref 3.5–5.1)
Potassium: 4.2 mmol/L (ref 3.5–5.1)
Sodium: 140 mmol/L (ref 135–145)
Sodium: 139 mmol/L (ref 135–145)
Sodium: 139 mmol/L (ref 135–145)
Sodium: 139 mmol/L (ref 135–145)
Sodium: 139 mmol/L (ref 135–145)
Sodium: 140 mmol/L (ref 135–145)
TCO2: 24 mmol/L (ref 22–32)
TCO2: 24 mmol/L (ref 22–32)
TCO2: 28 mmol/L (ref 22–32)
TCO2: 27 mmol/L (ref 22–32)
TCO2: 25 mmol/L (ref 22–32)
TCO2: 25 mmol/L (ref 22–32)

## 2020-07-18 LAB — GLUCOSE, CAPILLARY
Glucose-Capillary: 109 mg/dL — ABNORMAL HIGH (ref 70–99)
Glucose-Capillary: 104 mg/dL — ABNORMAL HIGH (ref 70–99)
Glucose-Capillary: 120 mg/dL — ABNORMAL HIGH (ref 70–99)
Glucose-Capillary: 106 mg/dL — ABNORMAL HIGH (ref 70–99)
Glucose-Capillary: 118 mg/dL — ABNORMAL HIGH (ref 70–99)
Glucose-Capillary: 145 mg/dL — ABNORMAL HIGH (ref 70–99)

## 2020-07-18 LAB — CBC
HCT: 28.8 % — ABNORMAL LOW (ref 39.0–52.0)
HCT: 31.4 % — ABNORMAL LOW (ref 39.0–52.0)
Hemoglobin: 10.1 g/dL — ABNORMAL LOW (ref 13.0–17.0)
Hemoglobin: 10.8 g/dL — ABNORMAL LOW (ref 13.0–17.0)
MCH: 34.2 pg — ABNORMAL HIGH (ref 26.0–34.0)
MCH: 34.2 pg — ABNORMAL HIGH (ref 26.0–34.0)
MCHC: 35.1 g/dL (ref 30.0–36.0)
MCHC: 34.4 g/dL (ref 30.0–36.0)
MCV: 97.6 fL (ref 80.0–100.0)
MCV: 99.4 fL (ref 80.0–100.0)
Platelets: 97 10*3/uL — ABNORMAL LOW (ref 150–400)
Platelets: 107 10*3/uL — ABNORMAL LOW (ref 150–400)
RBC: 2.95 MIL/uL — ABNORMAL LOW (ref 4.22–5.81)
RBC: 3.16 MIL/uL — ABNORMAL LOW (ref 4.22–5.81)
RDW: 13.7 % (ref 11.5–15.5)
RDW: 13.7 % (ref 11.5–15.5)
WBC: 12.5 10*3/uL — ABNORMAL HIGH (ref 4.0–10.5)
WBC: 13.8 10*3/uL — ABNORMAL HIGH (ref 4.0–10.5)
nRBC: 0 % (ref 0.0–0.2)
nRBC: 0 % (ref 0.0–0.2)

## 2020-07-18 LAB — POCT I-STAT EG7
Acid-Base Excess: 0 mmol/L (ref 0.0–2.0)
Bicarbonate: 25.7 mmol/L (ref 20.0–28.0)
Calcium, Ion: 1.11 mmol/L — ABNORMAL LOW (ref 1.15–1.40)
HCT: 26 % — ABNORMAL LOW (ref 39.0–52.0)
Hemoglobin: 8.8 g/dL — ABNORMAL LOW (ref 13.0–17.0)
O2 Saturation: 86 %
Potassium: 3.8 mmol/L (ref 3.5–5.1)
Sodium: 141 mmol/L (ref 135–145)
TCO2: 27 mmol/L (ref 22–32)
pCO2, Ven: 44.9 mmHg (ref 44.0–60.0)
pH, Ven: 7.366 (ref 7.250–7.430)
pO2, Ven: 54 mmHg — ABNORMAL HIGH (ref 32.0–45.0)

## 2020-07-18 LAB — BASIC METABOLIC PANEL
Anion gap: 13 (ref 5–15)
BUN: 13 mg/dL (ref 8–23)
CO2: 17 mmol/L — ABNORMAL LOW (ref 22–32)
Calcium: 7.5 mg/dL — ABNORMAL LOW (ref 8.9–10.3)
Chloride: 105 mmol/L (ref 98–111)
Creatinine, Ser: 1.25 mg/dL — ABNORMAL HIGH (ref 0.61–1.24)
GFR, Estimated: >60 mL/min (ref >60)
Glucose, Bld: 143 mg/dL — ABNORMAL HIGH (ref 70–99)
Potassium: 4.5 mmol/L (ref 3.5–5.1)
Sodium: 135 mmol/L (ref 135–145)

## 2020-07-18 LAB — MAGNESIUM: Magnesium: 2.8 mg/dL — ABNORMAL HIGH (ref 1.7–2.4)

## 2020-07-18 LAB — PROTIME-INR
INR: 1.5 — ABNORMAL HIGH (ref 0.8–1.2)
Prothrombin Time: 17.4 seconds — ABNORMAL HIGH (ref 11.4–15.2)

## 2020-07-18 LAB — ECHO INTRAOPERATIVE TEE
AR max vel: 2.67 cm2
AV Area VTI: 2.65 cm2
AV Area mean vel: 2.57 cm2
AV Mean grad: 5 mmHg
AV Peak grad: 10.2 mmHg
Ao pk vel: 1.6 m/s
Area-P 1/2: 3.58 cm2
Height: 70 in
MV M vel: 5.2 m/s
MV Peak grad: 108.2 mmHg
Radius: 1.1 cm
S' Lateral: 2.63 cm
Weight: 3504 oz

## 2020-07-18 LAB — PLATELET COUNT: Platelets: 86 10*3/uL — ABNORMAL LOW (ref 150–400)

## 2020-07-18 LAB — APTT: aPTT: 39 seconds — ABNORMAL HIGH (ref 24–36)

## 2020-07-18 LAB — HEMOGLOBIN AND HEMATOCRIT, BLOOD
HCT: 22.2 % — ABNORMAL LOW (ref 39.0–52.0)
Hemoglobin: 7.8 g/dL — ABNORMAL LOW (ref 13.0–17.0)

## 2020-07-18 LAB — ABO/RH: ABO/RH(D): O NEG

## 2020-07-18 LAB — PREPARE RBC (CROSSMATCH)

## 2020-07-18 SURGERY — REPAIR, MITRAL VALVE, MINIMALLY INVASIVE
Anesthesia: General | Site: Chest | Laterality: Right

## 2020-07-18 MED ORDER — CHLORHEXIDINE GLUCONATE 0.12 % MT SOLN
15.0000 mL | Freq: Once | OROMUCOSAL | Status: AC
  Administered 2020-07-18: 15 mL via OROMUCOSAL

## 2020-07-18 MED ORDER — LACTATED RINGERS IV SOLN: INTRAVENOUS | Status: DC

## 2020-07-18 MED ORDER — MORPHINE SULFATE (PF) 2 MG/ML IV SOLN: 1.0000 mg | INTRAVENOUS | Status: DC | PRN

## 2020-07-18 MED ORDER — DEXMEDETOMIDINE HCL IN NACL 400 MCG/100ML IV SOLN: 0.0000 ug/kg/h | INTRAVENOUS | Status: DC

## 2020-07-18 MED ORDER — SODIUM CHLORIDE 0.9% FLUSH
3.0000 mL | Freq: Two times a day (BID) | INTRAVENOUS | Status: DC
  Administered 2020-07-18 – 2020-07-21 (×5): 3 mL via INTRAVENOUS

## 2020-07-18 MED ORDER — SODIUM CHLORIDE 0.45 % IV SOLN: INTRAVENOUS | Status: DC | PRN

## 2020-07-18 MED ORDER — POTASSIUM CHLORIDE 10 MEQ/50ML IV SOLN
10.0000 meq | INTRAVENOUS | Status: AC
  Administered 2020-07-18 (×3): 10 meq via INTRAVENOUS

## 2020-07-18 MED ORDER — HALOPERIDOL LACTATE 5 MG/ML IJ SOLN
INTRAMUSCULAR | Status: AC
  Administered 2020-07-18: 2 mg via INTRAVENOUS
  Filled 2020-07-18: qty 1

## 2020-07-18 MED ORDER — INSULIN ASPART 100 UNIT/ML ~~LOC~~ SOLN
0.0000 [IU] | SUBCUTANEOUS | Status: DC
  Administered 2020-07-18: 2 [IU] via SUBCUTANEOUS
  Administered 2020-07-19: 4 [IU] via SUBCUTANEOUS

## 2020-07-18 MED ORDER — SODIUM CHLORIDE 0.9 % IV SOLN
250.0000 mL | INTRAVENOUS | Status: DC
  Administered 2020-07-19: 250 mL via INTRAVENOUS

## 2020-07-18 MED ORDER — MAGNESIUM SULFATE 4 GM/100ML IV SOLN
4.0000 g | Freq: Once | INTRAVENOUS | Status: AC
  Administered 2020-07-18: 4 g via INTRAVENOUS
  Filled 2020-07-18: qty 100

## 2020-07-18 MED ORDER — LACTATED RINGERS IV SOLN: 500.0000 mL | Freq: Once | INTRAVENOUS | Status: DC | PRN

## 2020-07-18 MED ORDER — LACTATED RINGERS IV SOLN: INTRAVENOUS | Status: DC | PRN

## 2020-07-18 MED ORDER — FENTANYL CITRATE (PF) 250 MCG/5ML IJ SOLN
INTRAMUSCULAR | Status: DC | PRN
  Administered 2020-07-18 (×3): 50 ug via INTRAVENOUS
  Administered 2020-07-18 (×2): 100 ug via INTRAVENOUS
  Administered 2020-07-18 (×9): 50 ug via INTRAVENOUS

## 2020-07-18 MED ORDER — LIDOCAINE 2% (20 MG/ML) 5 ML SYRINGE
INTRAMUSCULAR | Status: AC
  Filled 2020-07-18: qty 5

## 2020-07-18 MED ORDER — SUCCINYLCHOLINE CHLORIDE 200 MG/10ML IV SOSY
PREFILLED_SYRINGE | INTRAVENOUS | Status: AC
  Filled 2020-07-18: qty 10

## 2020-07-18 MED ORDER — TRAMADOL HCL 50 MG PO TABS
50.0000 mg | ORAL_TABLET | ORAL | Status: DC | PRN
  Administered 2020-07-20: 50 mg via ORAL
  Administered 2020-07-21 – 2020-07-22 (×4): 100 mg via ORAL
  Administered 2020-07-22: 50 mg via ORAL
  Filled 2020-07-18: qty 2
  Filled 2020-07-18: qty 1
  Filled 2020-07-18: qty 2
  Filled 2020-07-18: qty 1
  Filled 2020-07-18 (×2): qty 2

## 2020-07-18 MED ORDER — BUPIVACAINE LIPOSOME 1.3 % IJ SUSP
20.0000 mL | INTRAMUSCULAR | Status: AC
  Administered 2020-07-18: 20 mL
  Filled 2020-07-18: qty 20

## 2020-07-18 MED ORDER — CHLORHEXIDINE GLUCONATE 4 % EX LIQD: 30.0000 mL | CUTANEOUS | Status: DC

## 2020-07-18 MED ORDER — CHLORHEXIDINE GLUCONATE 0.12 % MT SOLN
OROMUCOSAL | Status: AC
  Filled 2020-07-18: qty 15

## 2020-07-18 MED ORDER — DOCUSATE SODIUM 100 MG PO CAPS
200.0000 mg | ORAL_CAPSULE | Freq: Every day | ORAL | Status: DC
  Administered 2020-07-19 – 2020-07-21 (×3): 200 mg via ORAL
  Filled 2020-07-18 (×3): qty 2

## 2020-07-18 MED ORDER — EPHEDRINE SULFATE 50 MG/ML IJ SOLN
INTRAMUSCULAR | Status: DC | PRN
  Administered 2020-07-18: 10 mg via INTRAVENOUS

## 2020-07-18 MED ORDER — ONDANSETRON HCL 4 MG/2ML IJ SOLN
INTRAMUSCULAR | Status: DC | PRN
  Administered 2020-07-18: 4 mg via INTRAVENOUS

## 2020-07-18 MED ORDER — 0.9 % SODIUM CHLORIDE (POUR BTL) OPTIME
TOPICAL | Status: DC | PRN
  Administered 2020-07-18: 4000 mL

## 2020-07-18 MED ORDER — VANCOMYCIN HCL 1000 MG IV SOLR
INTRAVENOUS | Status: DC | PRN
  Administered 2020-07-18: 1000 mL

## 2020-07-18 MED ORDER — PANTOPRAZOLE SODIUM 40 MG PO TBEC
40.0000 mg | DELAYED_RELEASE_TABLET | Freq: Every day | ORAL | Status: DC
  Administered 2020-07-19 – 2020-07-21 (×3): 40 mg via ORAL
  Filled 2020-07-18 (×3): qty 1

## 2020-07-18 MED ORDER — ALBUMIN HUMAN 5 % IV SOLN
250.0000 mL | INTRAVENOUS | Status: AC | PRN
  Administered 2020-07-18 (×3): 12.5 g via INTRAVENOUS
  Filled 2020-07-18: qty 250

## 2020-07-18 MED ORDER — ALBUMIN HUMAN 5 % IV SOLN: INTRAVENOUS | Status: DC | PRN

## 2020-07-18 MED ORDER — HALOPERIDOL LACTATE 5 MG/ML IJ SOLN: 2.0000 mg | Freq: Once | INTRAMUSCULAR | Status: AC

## 2020-07-18 MED ORDER — BISACODYL 10 MG RE SUPP: 10.0000 mg | Freq: Every day | RECTAL | Status: DC

## 2020-07-18 MED ORDER — SODIUM CHLORIDE 0.9 % IV SOLN: INTRAVENOUS | Status: DC

## 2020-07-18 MED ORDER — ACETAMINOPHEN 160 MG/5ML PO SOLN: 1000.0000 mg | Freq: Four times a day (QID) | ORAL | Status: DC

## 2020-07-18 MED ORDER — CHLORHEXIDINE GLUCONATE CLOTH 2 % EX PADS
6.0000 | MEDICATED_PAD | Freq: Every day | CUTANEOUS | Status: DC
  Administered 2020-07-18 – 2020-07-20 (×3): 6 via TOPICAL

## 2020-07-18 MED ORDER — MIDAZOLAM HCL (PF) 10 MG/2ML IJ SOLN
INTRAMUSCULAR | Status: AC
  Filled 2020-07-18: qty 2

## 2020-07-18 MED ORDER — PLASMA-LYTE 148 IV SOLN
INTRAVENOUS | Status: DC | PRN
  Administered 2020-07-18: 500 mL via INTRAVASCULAR

## 2020-07-18 MED ORDER — CEFUROXIME SODIUM 1.5 G IV SOLR
1.5000 g | Freq: Two times a day (BID) | INTRAVENOUS | Status: AC
  Administered 2020-07-18 – 2020-07-20 (×4): 1.5 g via INTRAVENOUS
  Filled 2020-07-18 (×4): qty 1.5

## 2020-07-18 MED ORDER — ACETAMINOPHEN 500 MG PO TABS
1000.0000 mg | ORAL_TABLET | Freq: Four times a day (QID) | ORAL | Status: DC
  Administered 2020-07-18 – 2020-07-21 (×9): 1000 mg via ORAL
  Filled 2020-07-18 (×10): qty 2

## 2020-07-18 MED ORDER — ASPIRIN 81 MG PO CHEW: 324.0000 mg | CHEWABLE_TABLET | Freq: Every day | ORAL | Status: DC

## 2020-07-18 MED ORDER — ASPIRIN EC 325 MG PO TBEC
325.0000 mg | DELAYED_RELEASE_TABLET | Freq: Every day | ORAL | Status: AC
  Administered 2020-07-19: 325 mg via ORAL
  Filled 2020-07-18: qty 1

## 2020-07-18 MED ORDER — MIDAZOLAM HCL 5 MG/5ML IJ SOLN
INTRAMUSCULAR | Status: DC | PRN
  Administered 2020-07-18: 2 mg via INTRAVENOUS
  Administered 2020-07-18 (×2): 1 mg via INTRAVENOUS

## 2020-07-18 MED ORDER — DEXAMETHASONE SODIUM PHOSPHATE 10 MG/ML IJ SOLN
INTRAMUSCULAR | Status: DC | PRN
  Administered 2020-07-18: 10 mg via INTRAVENOUS

## 2020-07-18 MED ORDER — ONDANSETRON HCL 4 MG/2ML IJ SOLN: 4.0000 mg | Freq: Four times a day (QID) | INTRAMUSCULAR | Status: DC | PRN

## 2020-07-18 MED ORDER — PROTAMINE SULFATE 10 MG/ML IV SOLN
INTRAVENOUS | Status: AC
  Filled 2020-07-18: qty 50

## 2020-07-18 MED ORDER — BUPIVACAINE HCL 0.5 % IJ SOLN
INTRAMUSCULAR | Status: DC | PRN
  Administered 2020-07-18: 30 mL

## 2020-07-18 MED ORDER — PROPOFOL 10 MG/ML IV BOLUS
INTRAVENOUS | Status: DC | PRN
  Administered 2020-07-18: 20 mg via INTRAVENOUS
  Administered 2020-07-18: 140 mg via INTRAVENOUS

## 2020-07-18 MED ORDER — CHLORHEXIDINE GLUCONATE 0.12 % MT SOLN
15.0000 mL | OROMUCOSAL | Status: AC
  Administered 2020-07-18: 15 mL via OROMUCOSAL

## 2020-07-18 MED ORDER — NITROGLYCERIN IN D5W 200-5 MCG/ML-% IV SOLN: 0.0000 ug/min | INTRAVENOUS | Status: DC

## 2020-07-18 MED ORDER — FAMOTIDINE IN NACL 20-0.9 MG/50ML-% IV SOLN
20.0000 mg | Freq: Two times a day (BID) | INTRAVENOUS | Status: AC
  Administered 2020-07-18 (×2): 20 mg via INTRAVENOUS
  Filled 2020-07-18 (×2): qty 50

## 2020-07-18 MED ORDER — DEXTROSE 50 % IV SOLN: 0.0000 mL | INTRAVENOUS | Status: DC | PRN

## 2020-07-18 MED ORDER — SODIUM CHLORIDE 0.9% FLUSH
3.0000 mL | INTRAVENOUS | Status: DC | PRN
  Administered 2020-07-19: 3 mL via INTRAVENOUS

## 2020-07-18 MED ORDER — METOPROLOL TARTRATE 12.5 MG HALF TABLET: 12.5000 mg | ORAL_TABLET | Freq: Once | ORAL | Status: DC

## 2020-07-18 MED ORDER — FENTANYL CITRATE (PF) 250 MCG/5ML IJ SOLN
INTRAMUSCULAR | Status: AC
  Filled 2020-07-18: qty 25

## 2020-07-18 MED ORDER — HEPARIN SODIUM (PORCINE) 1000 UNIT/ML IJ SOLN
INTRAMUSCULAR | Status: DC | PRN
  Administered 2020-07-18: 35000 [IU] via INTRAVENOUS

## 2020-07-18 MED ORDER — SODIUM CHLORIDE 0.9 % IV SOLN: Freq: Once | INTRAVENOUS | Status: AC

## 2020-07-18 MED ORDER — PHENYLEPHRINE HCL-NACL 20-0.9 MG/250ML-% IV SOLN: 0.0000 ug/min | INTRAVENOUS | Status: DC

## 2020-07-18 MED ORDER — BISACODYL 5 MG PO TBEC
10.0000 mg | DELAYED_RELEASE_TABLET | Freq: Every day | ORAL | Status: DC
  Administered 2020-07-19 – 2020-07-20 (×2): 10 mg via ORAL
  Filled 2020-07-18 (×3): qty 2

## 2020-07-18 MED ORDER — OXYCODONE HCL 5 MG PO TABS
5.0000 mg | ORAL_TABLET | ORAL | Status: DC | PRN
  Administered 2020-07-18: 10 mg via ORAL
  Administered 2020-07-19: 5 mg via ORAL
  Filled 2020-07-18: qty 1
  Filled 2020-07-18: qty 2
  Filled 2020-07-18: qty 1
  Filled 2020-07-18: qty 2

## 2020-07-18 MED ORDER — PROTAMINE SULFATE 10 MG/ML IV SOLN
INTRAVENOUS | Status: DC | PRN
  Administered 2020-07-18: 340 mg via INTRAVENOUS
  Administered 2020-07-18: 10 mg via INTRAVENOUS

## 2020-07-18 MED ORDER — PROPOFOL 10 MG/ML IV BOLUS
INTRAVENOUS | Status: AC
  Filled 2020-07-18: qty 20

## 2020-07-18 MED ORDER — ACETAMINOPHEN 650 MG RE SUPP: 650.0000 mg | Freq: Once | RECTAL | Status: AC

## 2020-07-18 MED ORDER — BUPIVACAINE HCL (PF) 0.5 % IJ SOLN
INTRAMUSCULAR | Status: AC
  Filled 2020-07-18: qty 30

## 2020-07-18 MED ORDER — LIDOCAINE 2% (20 MG/ML) 5 ML SYRINGE
INTRAMUSCULAR | Status: DC | PRN
  Administered 2020-07-18: 100 mg via INTRAVENOUS

## 2020-07-18 MED ORDER — ROCURONIUM BROMIDE 10 MG/ML (PF) SYRINGE
PREFILLED_SYRINGE | INTRAVENOUS | Status: AC
  Filled 2020-07-18: qty 10

## 2020-07-18 MED ORDER — ACETAMINOPHEN 160 MG/5ML PO SOLN: 650.0000 mg | Freq: Once | ORAL | Status: AC

## 2020-07-18 MED ORDER — VANCOMYCIN HCL IN DEXTROSE 1-5 GM/200ML-% IV SOLN
1000.0000 mg | Freq: Once | INTRAVENOUS | Status: AC
  Administered 2020-07-18: 1000 mg via INTRAVENOUS
  Filled 2020-07-18: qty 200

## 2020-07-18 MED ORDER — ORAL CARE MOUTH RINSE
15.0000 mL | Freq: Two times a day (BID) | OROMUCOSAL | Status: DC
  Administered 2020-07-18 – 2020-07-22 (×7): 15 mL via OROMUCOSAL

## 2020-07-18 MED ORDER — SUGAMMADEX SODIUM 200 MG/2ML IV SOLN
INTRAVENOUS | Status: DC | PRN
  Administered 2020-07-18: 400 mg via INTRAVENOUS

## 2020-07-18 MED ORDER — INSULIN REGULAR(HUMAN) IN NACL 100-0.9 UT/100ML-% IV SOLN: INTRAVENOUS | Status: DC

## 2020-07-18 MED ORDER — ROCURONIUM BROMIDE 10 MG/ML (PF) SYRINGE
PREFILLED_SYRINGE | INTRAVENOUS | Status: DC | PRN
  Administered 2020-07-18: 50 mg via INTRAVENOUS
  Administered 2020-07-18: 70 mg via INTRAVENOUS
  Administered 2020-07-18: 30 mg via INTRAVENOUS

## 2020-07-18 MED ORDER — MIDAZOLAM HCL 2 MG/2ML IJ SOLN: 2.0000 mg | INTRAMUSCULAR | Status: DC | PRN

## 2020-07-18 SURGICAL SUPPLY — 108 items
ADAPTER CARDIO PERF ANTE/RETRO (ADAPTER) ×3 IMPLANT
BAG DECANTER FOR FLEXI CONT (MISCELLANEOUS) ×6 IMPLANT
BLADE CLIPPER SURG (BLADE) ×3 IMPLANT
BLADE SURG 11 STRL SS (BLADE) ×3 IMPLANT
CANISTER SUCT 3000ML PPV (MISCELLANEOUS) ×6 IMPLANT
CANNULA ADULT BIO-MEDICUS 15FR (CANNULA) ×3 IMPLANT
CANNULA FEM VENOUS REMOTE 22FR (CANNULA) ×3 IMPLANT
CANNULA FEMORAL ART 14 SM (MISCELLANEOUS) ×3 IMPLANT
CANNULA GUNDRY RCSP 15FR (MISCELLANEOUS) ×3 IMPLANT
CANNULA OPTISITE PERFUSION 16F (CANNULA) IMPLANT
CANNULA OPTISITE PERFUSION 18F (CANNULA) ×3 IMPLANT
CANNULA SUMP PERICARDIAL (CANNULA) ×6 IMPLANT
CATH CPB KIT OWEN (MISCELLANEOUS) IMPLANT
CATH KIT ON-Q SILVERSOAK 5IN (CATHETERS) IMPLANT
CELLS DAT CNTRL 66122 CELL SVR (MISCELLANEOUS) ×2 IMPLANT
CLOSURE PERCLOSE PROSTYLE (VASCULAR PRODUCTS) ×12 IMPLANT
CNTNR URN SCR LID CUP LEK RST (MISCELLANEOUS) ×2 IMPLANT
CONN ST 1/4X3/8  BEN (MISCELLANEOUS) ×6
CONN ST 1/4X3/8 BEN (MISCELLANEOUS) ×4 IMPLANT
CONNECTOR 1/2X3/8X1/2 3 WAY (MISCELLANEOUS) ×3
CONNECTOR 1/2X3/8X1/2 3WAY (MISCELLANEOUS) ×2 IMPLANT
CONT SPEC 4OZ STRL OR WHT (MISCELLANEOUS) ×3
COVER BACK TABLE 24X17X13 BIG (DRAPES) ×3 IMPLANT
COVER PROBE W GEL 5X96 (DRAPES) ×3 IMPLANT
DERMABOND ADHESIVE PROPEN (GAUZE/BANDAGES/DRESSINGS) ×2
DERMABOND ADVANCED (GAUZE/BANDAGES/DRESSINGS) ×2
DERMABOND ADVANCED .7 DNX12 (GAUZE/BANDAGES/DRESSINGS) ×4 IMPLANT
DERMABOND ADVANCED .7 DNX6 (GAUZE/BANDAGES/DRESSINGS) ×4 IMPLANT
DEVICE SUT CK QUICK LOAD INDV (Prosthesis & Implant Heart) ×6 IMPLANT
DEVICE SUT CK QUICK LOAD MINI (Prosthesis & Implant Heart) ×6 IMPLANT
DEVICE TROCAR PUNCTURE CLOSURE (ENDOMECHANICALS) ×3 IMPLANT
DRAIN CHANNEL 32F RND 10.7 FF (WOUND CARE) ×6 IMPLANT
DRAPE C-ARM 42X72 X-RAY (DRAPES) ×3 IMPLANT
DRAPE CV SPLIT W-CLR ANES SCRN (DRAPES) ×3 IMPLANT
DRAPE INCISE IOBAN 66X45 STRL (DRAPES) ×9 IMPLANT
DRAPE PERI GROIN 82X75IN TIB (DRAPES) ×3 IMPLANT
DRAPE SLUSH/WARMER DISC (DRAPES) ×3 IMPLANT
DRSG AQUACEL AG ADV 3.5X10 (GAUZE/BANDAGES/DRESSINGS) ×3 IMPLANT
DRSG TEGADERM 4X4.75 (GAUZE/BANDAGES/DRESSINGS) ×3 IMPLANT
ELECT BLADE 6.5 EXT (BLADE) ×3 IMPLANT
ELECT REM PT RETURN 9FT ADLT (ELECTROSURGICAL) ×6
ELECTRODE REM PT RTRN 9FT ADLT (ELECTROSURGICAL) ×4 IMPLANT
FELT TEFLON 1X6 (MISCELLANEOUS) ×6 IMPLANT
FEMORAL VENOUS CANN RAP (CANNULA) IMPLANT
GAUZE SPONGE 4X4 12PLY STRL (GAUZE/BANDAGES/DRESSINGS) ×3 IMPLANT
GAUZE SPONGE 4X4 12PLY STRL LF (GAUZE/BANDAGES/DRESSINGS) ×3 IMPLANT
GLOVE ORTHO TXT STRL SZ7.5 (GLOVE) ×9 IMPLANT
GLOVE SURG PR MICRO ENCORE 7.5 (GLOVE) ×6 IMPLANT
GLOVE SURG UNDER POLY LF SZ6 (GLOVE) ×3 IMPLANT
GLOVE SURG UNDER POLY LF SZ6.5 (GLOVE) ×21 IMPLANT
GOWN STRL REUS W/ TWL LRG LVL3 (GOWN DISPOSABLE) ×16 IMPLANT
GOWN STRL REUS W/TWL LRG LVL3 (GOWN DISPOSABLE) ×24
GRASPER SUT TROCAR 14GX15 (MISCELLANEOUS) ×3 IMPLANT
IV NS IRRIG 3000ML ARTHROMATIC (IV SOLUTION) ×3 IMPLANT
KIT BASIN OR (CUSTOM PROCEDURE TRAY) ×3 IMPLANT
KIT DILATOR VASC 18G NDL (KITS) ×3 IMPLANT
KIT DRAINAGE VACCUM ASSIST (KITS) ×3 IMPLANT
KIT SUCTION CATH 14FR (SUCTIONS) ×3 IMPLANT
KIT SUT CK MINI COMBO 4X17 (Prosthesis & Implant Heart) ×3 IMPLANT
KIT TURNOVER KIT B (KITS) ×3 IMPLANT
LEAD PACING MYOCARDI (MISCELLANEOUS) ×6 IMPLANT
LINE VENT (MISCELLANEOUS) ×3 IMPLANT
NEEDLE AORTIC ROOT 14G 7F (CATHETERS) ×3 IMPLANT
NS IRRIG 1000ML POUR BTL (IV SOLUTION) ×12 IMPLANT
PACK E MIN INVASIVE VALVE (SUTURE) ×3 IMPLANT
PACK OPEN HEART (CUSTOM PROCEDURE TRAY) ×3 IMPLANT
PAD ARMBOARD 7.5X6 YLW CONV (MISCELLANEOUS) ×6 IMPLANT
PAD ELECT DEFIB RADIOL ZOLL (MISCELLANEOUS) ×3 IMPLANT
POSITIONER HEAD DONUT 9IN (MISCELLANEOUS) ×3 IMPLANT
RING MITRAL MEMO 4D 32 (Prosthesis & Implant Heart) ×3 IMPLANT
RTRCTR WOUND ALEXIS 18CM MED (MISCELLANEOUS) ×3
SET CANNULATION TOURNIQUET (MISCELLANEOUS) ×3 IMPLANT
SET CARDIOPLEGIA MPS 5001102 (MISCELLANEOUS) ×3 IMPLANT
SET IRRIG TUBING LAPAROSCOPIC (IRRIGATION / IRRIGATOR) ×3 IMPLANT
SET MICROPUNCTURE 5F STIFF (MISCELLANEOUS) ×3 IMPLANT
SHEATH PINNACLE 8F 10CM (SHEATH) ×18 IMPLANT
SIZER CHORD-X CHORDAL CXCS (SIZER) ×3 IMPLANT
SOL ANTI FOG 6CC (MISCELLANEOUS) ×2 IMPLANT
SOLUTION ANTI FOG 6CC (MISCELLANEOUS) ×1
SUT BONE WAX W31G (SUTURE) ×3 IMPLANT
SUT EB EXC GRN/WHT 2-0 D/A SH (SUTURE) ×3
SUT ETHIBOND (SUTURE) ×6 IMPLANT
SUT ETHIBOND 2-0 RB-1 WHT (SUTURE) ×6 IMPLANT
SUT ETHIBOND X763 2 0 SH 1 (SUTURE) ×3 IMPLANT
SUT GORETEX CV 4 TH 22 36 (SUTURE) IMPLANT
SUT GORETEX CV4 TH-18 (SUTURE) IMPLANT
SUT PROLENE 3 0 SH DA (SUTURE) ×9 IMPLANT
SUT PROLENE 3 0 SH1 36 (SUTURE) ×12 IMPLANT
SUT PROLENE 6 0 C 1 30 (SUTURE) ×3 IMPLANT
SUT PTFE CHORD X 20MM (SUTURE) ×3 IMPLANT
SUT SILK  1 MH (SUTURE) ×6
SUT SILK 1 MH (SUTURE) ×4 IMPLANT
SUTURE EB EXC GRN/WHT 2-0 D/A (SUTURE) ×2 IMPLANT
SYSTEM SAHARA CHEST DRAIN ATS (WOUND CARE) ×3 IMPLANT
TAPE CLOTH SURG 4X10 WHT LF (GAUZE/BANDAGES/DRESSINGS) ×3 IMPLANT
TAPE PAPER 2X10 WHT MICROPORE (GAUZE/BANDAGES/DRESSINGS) ×3 IMPLANT
TOWEL GREEN STERILE (TOWEL DISPOSABLE) ×3 IMPLANT
TOWEL GREEN STERILE FF (TOWEL DISPOSABLE) ×3 IMPLANT
TRAY FOLEY SLVR 16FR TEMP STAT (SET/KITS/TRAYS/PACK) ×3 IMPLANT
TROCAR XCEL BLADELESS 5X75MML (TROCAR) ×3 IMPLANT
TROCAR XCEL NON-BLD 11X100MML (ENDOMECHANICALS) ×6 IMPLANT
TUBE SUCT INTRACARD DLP 20F (MISCELLANEOUS) ×3 IMPLANT
TUBING ART PRESS 48 MALE/FEM (TUBING) ×3 IMPLANT
TUNNELER SHEATH ON-Q 11GX8 DSP (PAIN MANAGEMENT) IMPLANT
UNDERPAD 30X36 HEAVY ABSORB (UNDERPADS AND DIAPERS) ×3 IMPLANT
WATER STERILE IRR 1000ML POUR (IV SOLUTION) ×6 IMPLANT
WIRE EMERALD 3MM-J .035X150CM (WIRE) ×3 IMPLANT
WIRE HI TORQ VERSACORE-J 145CM (WIRE) ×3 IMPLANT

## 2020-07-18 NOTE — Anesthesia Postprocedure Evaluation (Signed)
Anesthesia Post Note  Patient: Marrell Dicaprio Birkey  Procedure(s) Performed: MINIMALLY INVASIVE MITRAL VALVE REPAIR (MVR) USING 4D MEMO RING SIZE 32MM (Right Chest) TRANSESOPHAGEAL ECHOCARDIOGRAM (TEE) (N/A )     Patient location during evaluation: SICU Anesthesia Type: General Level of consciousness: responds to stimulation Pain management: pain level controlled Vital Signs Assessment: post-procedure vital signs reviewed and stable Respiratory status: patient connected to face mask oxygen Cardiovascular status: stable Postop Assessment: no apparent nausea or vomiting Anesthetic complications: no   No complications documented.  Last Vitals:  Vitals:   07/18/20 0624 07/18/20 1500  BP: (!) 168/78 108/69  Pulse: 72 70  Resp: 20 16  Temp: 36.8 C (!) 35.6 C  SpO2: 98% 100%    Last Pain:  Vitals:   07/18/20 1642  TempSrc: Core  PainSc: 0-No pain                 Belenda Cruise P Gillie Crisci

## 2020-07-18 NOTE — Op Note (Signed)
CARDIOTHORACIC SURGERY OPERATIVE NOTE  Date of Procedure:  07/18/2020  Preoperative Diagnosis: Severe Mitral Regurgitation  Postoperative Diagnosis: Same  Procedure:    Minimally-Invasive Mitral Valve Repair  Complex valvuloplasty including artificial Gore-tex neochord placement x6  Sorin Memo 4D Ring Annuloplasty (size 98mm, catalog # [id number], serial # [id number])    Surgeon: Valentina Gu. 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  Rupture primary chordae tendinae with flail portion of P2 segment of posterior leaflet   Type II dysfunction with severe mitral regurgitation  Normal left ventricular systolic function  No residual mitral regurgitation after successful valve repair                   BRIEF CLINICAL NOTE AND INDICATIONS FOR SURGERY  [name] with history of coronary artery disease,mitral valve prolapse with mitral regurgitation, hypertension, and hyperlipidemia who has been referred for surgical consultation to discuss treatment options for management of mitral valve prolapse with severe mitral regurgitation.  Patient's cardiac history dates back to 2012 when he presented with acute non-ST segment elevation myocardial infarction. He was found to have multivessel coronary artery disease and was treated with PCI and stenting of the right coronary artery using drug-eluting stent. He had residual moderate coronary artery disease in the left coronary system that was treated medically. He has been followed intermittently ever since by Dr. [name]. In 2019 the patient was first noted to have a systolic murmur on physical exam. Echocardiogram performed at that time revealed normal left ventricular systolic function with mitral valve prolapse and moderate mitral regurgitation, all of which was new in comparison with  previous echocardiogram performed in 2012.  Follow-up echocardiogram performed February 15, 2020 revealed mitral valve prolapse with severe mitral regurgitation. Left ventricular systolic function remain normal with ejection fraction estimated 60 to 65%. Patient was recently seen in follow-up by Dr. [name] and complained of decreased energy without any associated symptoms of chest discomfort or shortness of breath. Follow-up diagnostic cardiac catheterization revealed nonobstructive coronary artery disease with continued patency of stent in the right coronary artery. There was mildly elevated left heart filling pressures and mild pulmonary hypertension. Transesophageal echocardiogram was performed June 28, 2019 confirming the presence of severe prolapse involving middle scallop of the posterior leaflet with severe mitral regurgitation. There was no significant flow reversal in the pulmonary veins but regurgitant volume was calculated 95 mL with regurgitant fraction 56% and EROmeasured 86 mm using PISA.Left ventricular function appeared normal with ejection fraction estimated 60 to 65%. The left ventricle was not dilated. There was trivial tricuspid regurgitation and normal right ventricular size and function. Cardiothoracic surgical consultation was requested.  The patient has been seen in consultation and counseled at length regarding the indications, risks and potential benefits of surgery.  All questions have been answered, and the patient provides full informed consent for the operation as described.    DETAILS OF THE OPERATIVE PROCEDURE  Preparation:  The patient is brought to the operating room on the above mentioned date and central monitoring was established by the anesthesia team including placement of Swan-Ganz catheter through the left internal jugular vein.  A radial arterial line is placed. The patient is placed in the supine position on the operating table.  Intravenous  antibiotics are administered. General endotracheal anesthesia is induced uneventfully. The patient is initially intubated using a dual lumen endotracheal tube.  A Foley  catheter is placed.  Baseline transesophageal echocardiogram was performed.  Findings were notable for myxomatous degenerative disease of the mitral valve with an obvious flail segment involving the middle scallop of the posterior leaflet.  There were ruptured primary chordae tendinae with a very eccentric jet of mitral regurgitation directed anteriorly around the left atrium.  There was severe mitral regurgitation.  There was normal left ventricular size and systolic function.  There was normal right ventricular size and systolic function.  No other significant abnormalities were noted.  A soft roll is placed behind the patient's left scapula and the neck gently extended and turned to the left.   The patient's right neck, chest, abdomen, both groins, and both lower extremities are prepared and draped in a sterile manner. A time out procedure is performed.   Percutaneous Vascular Access:  Percutaneous arterial and venous access were obtained on the right side.  Using ultrasound guidance the right common femoral vein was cannulated using the Seldinger technique a pair of Perclose vascular closure devises were placed at opposing 30 degree angles in the femoral vein, after which time an 8 French sheath inserted.  The right common femoral artery was cannulated using a micropuncture wire and sheath.  A pair of Perclose vascular closure devices were placed at opposing 30 degree angles in the femoral artery, and a 8 French sheath inserted.  The right internal jugular vein was cannulated  using ultrasound guidance and an 8 French sheath inserted.     Surgical Approach:  A right miniature anterolateral thoracotomy incision is performed. The incision is placed just lateral to and superior to the right nipple. The pectoralis major muscle is  retracted medially and completely preserved. The right pleural space is entered through the 3rd intercostal space. A soft tissue retractor is placed.  Two 11 mm ports are placed through separate stab incisions inferiorly. The right pleural space is insufflated continuously with carbon dioxide gas through the posterior port during the remainder of the operation.  A pledgeted sutures placed through the dome of the right hemidiaphragm and retracted inferiorly to facilitate exposure.  A longitudinal incision is made in the pericardium 3 cm anterior to the phrenic nerve and silk traction sutures are placed on either side of the incision for exposure.   Extracorporeal Cardiopulmonary Bypass and Myocardial Protection:  The patient was heparinized systemically.  The right common femoral vein is cannulated through the venous sheath and a guidewire advanced into the right atrium using TEE guidance.  The femoral vein cannulated using a 22 Fr long femoral venous cannula.  The right common femoral artery is cannulated through the arterial sheath and a guidewire advanced into the descending thoracic aorta using TEE guidance.  Femoral artery is cannulated with a 18 French femoral arterial cannula.  The right internal jugular vein is cannulated through the venous sheath and a guidewire advanced into the right atrium.  The internal jugular vein is cannulated using a 15 [place] pediatric femoral venous cannula.   Adequate heparinization is verified.   The entire pre-bypass portion of the operation was notable for stable hemodynamics.  Cardiopulmonary bypass was begun.  Vacuum assist venous drainage is utilized. The incision in the pericardium is extended in both directions. Venous drainage and exposure are notably excellent. A retrograde cardioplegia cannula is placed through the right atrium into the coronary sinus using transesophageal echocardiogram guidance.  An antegrade cardioplegia cannula is placed in the ascending  aorta.    The patient is cooled to 32C systemic temperature.  The aortic cross clamp is applied and cardioplegia is delivered initially in an antegrade fashion through the aortic root using modified [place] cold blood cardioplegia ([place] blood cardioplegia protocol).   The initial cardioplegic arrest is rapid with early diastolic arrest.  Myocardial protection was felt to be excellent.   Mitral Valve Repair:  A left atriotomy incision was performed through the interatrial groove and extended partially across the back wall of the left atrium after opening the oblique sinus inferiorly.  The mitral valve is exposed using a self-retaining retractor.  The mitral valve was inspected and notable for forme fruste variant of Barlow's type myxomatous degenerative disease.  The posterior leaflet was severely prolapsing with ruptured primary chordae tendinae from the P2 segment of the posterior leaflet on the anterior side of midline.  There was mild to moderate thickening of both leaflets with a medium to large size valve and some mild calcification of the posterior annulus.  Artificial neochord placement was performed using Chord-X multi-strand CV-4 Goretex pre-measured loops.  The appropriate cord length (11mm) was measured from corresponding normal length primary cords from the P2 segment of the posterior leaflet on the posterior side of midline. The papillary muscle suture of a Chord-X multi-strand suture was placed through the head of the anterior papillary muscle in a horizontal mattress fashion and tied over Teflon felt pledgets. Each of the three pre-measured loops were then reimplanted into the free margin of the P2 segment of the posterior leaflet on the anterior side of midline.  Interrupted 2-0 Ethibond horizontal mattress sutures are placed circumferentially around the entire mitral valve annulus. The valve was tested with saline and appeared competent even without ring annuloplasty complete. The  valve was sized to a 32 mm annuloplasty ring, based upon the transverse distance between the left and right commissures and the height of the anterior leaflet, corresponding to a size just slightly larger than the overall surface area of the anterior leaflet.  A Sorin Memo 4D annuloplasty ring (size 35mm, catalog #[id number], serial [id number]) was secured in place uneventfully. All ring sutures were secured using a Cor-knot device.    The valve was tested with saline and appeared competent. There is no residual leak. There was a broad, symmetrical line of coaptation of the anterior and posterior leaflet which was confirmed using the blue ink test.  Rewarming is begun.   Procedure Completion:  The atriotomy was closed using a 2-layer closure of running 3-0 Prolene suture after placing a sump drain across the mitral valve to serve as a left ventricular vent.  One final dose of warm "reanimation dose" cardioplegia was administered through the aortic root.  The aortic cross clamp was removed after a total cross clamp time of 102 minutes.  Epicardial pacing wires are fixed to the inferior wall of the right ventricule and to the right atrial appendage. The patient is rewarmed to 37C temperature. The left ventricular vent and antegrade cardioplegia cannula are removed.  The pericardial sac was drained using a 32 French Bard drain placed through the anterior port incision. The patient is weaned and disconnected from cardiopulmonary bypass.  The patient's rhythm at separation from bypass was atrial paced.  The patient was weaned from bypass without any inotropic support. Total cardiopulmonary bypass time for the operation was 159 minutes.  Followup transesophageal echocardiogram performed after separation from bypass revealed  a well-seated annuloplasty ring in the mitral position with a normal functioning mitral valve. There was no residual leak.  Left ventricular function  was unchanged from preoperatively.  The mean  gradient across the mitral valve was estimated to be 3 mmHg.  The femoral arterial and venous cannulas were removed and all Perclose sutures secured.  Manual pressure was maintained while Protamine was administered.  The right internal jugular cannula was removed and manual pressure held on the neck and groin for 15 minutes.  Single lung ventilation was begun. The atriotomy closure was inspected for hemostasis.  The right pleural space is irrigated with saline solution and inspected for hemostasis.   A mixture of Exparel liposomal bupivacaine (20 mL) and 0.5% bupivacaine (30 mL) is utilized to create an intercostal nerve block for postoperative analgesia.  The mixture is injected under direct vision into the intercostal neurovascular bundles posteriorly to cover the second through the sixth intercostal nerve roots.  Portions of the solution are also injected into the intercostal neurovascular bundles immediately surrounding the surgical incision and immediately adjacent to the chest tube exit sites.  The right pleural space was drained using a 32 French Bard drain placed through the posterior port incision. The miniature thoracotomy incision was closed in multiple layers in routine fashion.   The post-bypass portion of the operation was notable for stable rhythm and hemodynamics.  No blood products were administered during the operation.   Disposition:  The patient tolerated the procedure well.  The patient was extubated in the operating room and subsequently transported to the surgical intensive care unit in stable condition. There were no intraoperative complications. All sponge instrument and needle counts are verified correct at completion of the operation.     Valentina Gu. Roxy Manns MD 07/18/2020 1:48 PM

## 2020-07-18 NOTE — Anesthesia Procedure Notes (Signed)
Procedure Name: Intubation Date/Time: 07/18/2020 7:41 AM Performed by: Terrence Dupont, RN Pre-anesthesia Checklist: Patient identified, Emergency Drugs available, Suction available and Patient being monitored Patient Re-evaluated:Patient Re-evaluated prior to induction Oxygen Delivery Method: Circle system utilized Preoxygenation: Pre-oxygenation with 100% oxygen Induction Type: IV induction Ventilation: Two handed mask ventilation required and Oral airway inserted - appropriate to patient size Laryngoscope Size: Mac and 3 Grade View: Grade I Tube type: Oral Endobronchial tube: Double lumen EBT, Left and EBT position confirmed by fiberoptic bronchoscope and 39 Fr Number of attempts: 1 Airway Equipment and Method: Stylet,  Oral airway and Fiberoptic brochoscope Placement Confirmation: ETT inserted through vocal cords under direct vision,  positive ETCO2 and breath sounds checked- equal and bilateral Tube secured with: Tape Dental Injury: Teeth and Oropharynx as per pre-operative assessment

## 2020-07-18 NOTE — Anesthesia Procedure Notes (Signed)
Central Venous Catheter Insertion Performed by: Darral Dash, DO, anesthesiologist Start/End3/12/2020 7:05 AM, 07/18/2020 7:07 AM Patient location: Pre-op. Preanesthetic checklist: patient identified, IV checked, site marked, risks and benefits discussed, surgical consent, monitors and equipment checked, pre-op evaluation, timeout performed and anesthesia consent Position: supine Hand hygiene performed  and maximum sterile barriers used  PA cath was placed.Swan type:thermodilution PA Cath depth:55 Procedure performed without using ultrasound guided technique. Attempts: 1 Patient tolerated the procedure well with no immediate complications.

## 2020-07-18 NOTE — Progress Notes (Signed)
  Echocardiogram Echocardiogram Transesophageal has been performed.  Jesse [name] 07/18/2020, 8:55 AM

## 2020-07-18 NOTE — Interval H&P Note (Signed)
History and Physical Interval Note:  07/18/2020 6:01 AM  Jesse [name]  has presented today for surgery, with the diagnosis of MR.  The various methods of treatment have been discussed with the patient and family. After consideration of risks, benefits and other options for treatment, the patient has consented to  Procedure(s) with comments: [name] (MVR) (Right) - GLUTARALDEHYDE TRANSESOPHAGEAL ECHOCARDIOGRAM (TEE) (N/A) as a surgical intervention.  The patient's history has been reviewed, patient examined, no change in status, stable for surgery.  I have reviewed the patient's chart and labs.  Questions were answered to the patient's satisfaction.     [name]

## 2020-07-18 NOTE — Transfer of Care (Signed)
Immediate Anesthesia Transfer of Care Note  Patient: Jesse [name]  Procedure(s) Performed: MINIMALLY INVASIVE MITRAL VALVE REPAIR (MVR) USING 4D MEMO RING SIZE 32MM (Right Chest) TRANSESOPHAGEAL ECHOCARDIOGRAM (TEE) (N/A )  Patient Location: SICU  Anesthesia Type:General  Level of Consciousness: drowsy  Airway & Oxygen Therapy: Patient Spontanous Breathing and Patient connected to face mask oxygen  Post-op Assessment: Report given to RN and Post -op Vital signs reviewed and stable  Post vital signs: Reviewed and stable  Last Vitals:  Vitals Value Taken Time  BP 108/69 07/18/20 1431  Temp 35.7 C 07/18/20 1446  Pulse 79 07/18/20 1446  Resp 19 07/18/20 1446  SpO2 100 % 07/18/20 1446  Vitals shown include unvalidated device data.  Last Pain:  Vitals:   07/18/20 0624  TempSrc: Oral  PainSc:          Complications: No complications documented.

## 2020-07-18 NOTE — Progress Notes (Signed)
Patient ID: Jesse [name], male   DOB: [birth year], 75 y.o.   MRN: [id number]  TCTS Evening Rounds:   Hemodynamically stable  CI = 1.9 AV paced 80  Extubated and alert  Urine output good  CT output low  CBC    Component Value Date/Time   WBC 12.5 (H) 07/18/2020 1530   RBC 2.95 (L) 07/18/2020 1530   HGB 10.1 (L) 07/18/2020 1530   HGB 14.7 06/07/2020 1307   HCT 28.8 (L) 07/18/2020 1530   HCT 42.5 06/07/2020 1307   PLT 97 (L) 07/18/2020 1530   PLT 157 06/07/2020 1307   MCV 97.6 07/18/2020 1530   MCV 98 (H) 06/07/2020 1307   MCH 34.2 (H) 07/18/2020 1530   MCHC 35.1 07/18/2020 1530   RDW 13.7 07/18/2020 1530   RDW 12.9 06/07/2020 1307   LYMPHSABS 1.8 06/07/2020 1307   MONOABS 0.4 03/04/2020 0550   EOSABS 0.1 06/07/2020 1307   BASOSABS 0.1 06/07/2020 1307     BMET    Component Value Date/Time   NA 141 07/18/2020 1505   NA 144 06/07/2020 1307   K 3.8 07/18/2020 1505   CL 103 07/18/2020 1322   CO2 20 (L) 07/14/2020 1150   GLUCOSE 92 07/18/2020 1322   BUN 12 07/18/2020 1322   BUN 18 06/07/2020 1307   CREATININE 1.00 07/18/2020 1322   CREATININE 1.42 (H) 03/27/2016 1048   CALCIUM 9.2 07/14/2020 1150   GFRNONAA >60 07/14/2020 1150   GFRAA 59 (L) 06/07/2020 1307     A/P:  Stable postop course. Continue current plans

## 2020-07-18 NOTE — Anesthesia Procedure Notes (Signed)
Central Venous Catheter Insertion Performed by: Darral Dash, DO, anesthesiologist Start/End3/12/2020 6:55 AM, 07/18/2020 7:05 AM Patient location: Pre-op. Preanesthetic checklist: patient identified, IV checked, site marked, risks and benefits discussed, surgical consent, monitors and equipment checked, pre-op evaluation, timeout performed and anesthesia consent Position: Trendelenburg Lidocaine 1% used for infiltration and patient sedated Hand hygiene performed  and maximum sterile barriers used  Catheter size: 9 Fr Central line was placed.MAC introducer Procedure performed using ultrasound guided technique. Ultrasound Notes:anatomy identified, needle tip was noted to be adjacent to the nerve/plexus identified, no ultrasound evidence of intravascular and/or intraneural injection and image(s) printed for medical record Attempts: 1 Following insertion, line sutured, dressing applied and Biopatch. Post procedure assessment: blood return through all ports, free fluid flow and no air  Patient tolerated the procedure well with no immediate complications.

## 2020-07-18 NOTE — Brief Op Note (Signed)
07/18/2020  12:36 PM  PATIENT:  Jesse [name]  75 y.o. male  PRE-OPERATIVE DIAGNOSIS:  mitral regurgitation  POST-OPERATIVE DIAGNOSIS:  mitral regurgitation  PROCEDURE:  Procedure(s): MINIMALLY INVASIVE MITRAL VALVE REPAIR (MVR) USING 4D MEMO RING SIZE 32MM (Right) TRANSESOPHAGEAL ECHOCARDIOGRAM (TEE) (N/A)  SURGEON:  Surgeon(s) and Role:    [name], MD - Primary  PHYSICIAN ASSISTANT:WAYNE GOLD PA-C  ASSISTANTS: STAFF   ANESTHESIA:   general  EBL:  413 mL   BLOOD ADMINISTERED:none  DRAINS: RIGHT PLEURAL AND MEDIASTINAL CHEST TUBES   LOCAL MEDICATIONS USED:  NONE  SPECIMEN:  No Specimen  DISPOSITION OF SPECIMEN:  N/A  COUNTS:  YES  TOURNIQUET:  * No tourniquets in log *  DICTATION: .Dragon Dictation  PLAN OF CARE: Admit to inpatient   PATIENT DISPOSITION:  ICU - intubated and hemodynamically stable.   Delay start of Pharmacological VTE agent (>24hrs) due to surgical blood loss or risk of bleeding: yes  COMPLICATIONS: NO KNOWN

## 2020-07-18 NOTE — Anesthesia Procedure Notes (Signed)
Arterial Line Insertion Start/End3/12/2020 6:45 AM, 07/18/2020 6:47 AM Performed by: Darral Dash, DO, Badalamenti, Maricela Bo, RN, CRNA  Patient location: Pre-op. Preanesthetic checklist: patient identified, IV checked, site marked, risks and benefits discussed, surgical consent, monitors and equipment checked, pre-op evaluation, timeout performed and anesthesia consent Lidocaine 1% used for infiltration radial was placed Catheter size: 20 Fr Hand hygiene performed  and maximum sterile barriers used   Attempts: 1 Procedure performed without using ultrasound guided technique. Following insertion, dressing applied. Post procedure assessment: normal and unchanged  Patient tolerated the procedure well with no immediate complications.

## 2020-07-19 ENCOUNTER — Inpatient Hospital Stay (HOSPITAL_COMMUNITY): Payer: PPO

## 2020-07-19 ENCOUNTER — Encounter (HOSPITAL_COMMUNITY): Payer: Self-pay | Admitting: Thoracic Surgery (Cardiothoracic Vascular Surgery)

## 2020-07-19 LAB — GLUCOSE, CAPILLARY
Glucose-Capillary: 143 mg/dL — ABNORMAL HIGH (ref 70–99)
Glucose-Capillary: 150 mg/dL — ABNORMAL HIGH (ref 70–99)
Glucose-Capillary: 166 mg/dL — ABNORMAL HIGH (ref 70–99)
Glucose-Capillary: 168 mg/dL — ABNORMAL HIGH (ref 70–99)
Glucose-Capillary: 173 mg/dL — ABNORMAL HIGH (ref 70–99)
Glucose-Capillary: 196 mg/dL — ABNORMAL HIGH (ref 70–99)

## 2020-07-19 LAB — CBC
HCT: 26.1 % — ABNORMAL LOW (ref 39.0–52.0)
HCT: 26.1 % — ABNORMAL LOW (ref 39.0–52.0)
Hemoglobin: 9.1 g/dL — ABNORMAL LOW (ref 13.0–17.0)
Hemoglobin: 9.1 g/dL — ABNORMAL LOW (ref 13.0–17.0)
MCH: 34.3 pg — ABNORMAL HIGH (ref 26.0–34.0)
MCH: 34.6 pg — ABNORMAL HIGH (ref 26.0–34.0)
MCHC: 34.9 g/dL (ref 30.0–36.0)
MCHC: 34.9 g/dL (ref 30.0–36.0)
MCV: 98.5 fL (ref 80.0–100.0)
MCV: 99.2 fL (ref 80.0–100.0)
Platelets: 102 10*3/uL — ABNORMAL LOW (ref 150–400)
Platelets: 111 10*3/uL — ABNORMAL LOW (ref 150–400)
RBC: 2.65 MIL/uL — ABNORMAL LOW (ref 4.22–5.81)
RBC: 2.63 MIL/uL — ABNORMAL LOW (ref 4.22–5.81)
RDW: 13.7 % (ref 11.5–15.5)
RDW: 14.1 % (ref 11.5–15.5)
WBC: 13 10*3/uL — ABNORMAL HIGH (ref 4.0–10.5)
WBC: 16 10*3/uL — ABNORMAL HIGH (ref 4.0–10.5)
nRBC: 0 % (ref 0.0–0.2)
nRBC: 0 % (ref 0.0–0.2)

## 2020-07-19 LAB — MAGNESIUM
Magnesium: 2.5 mg/dL — ABNORMAL HIGH (ref 1.7–2.4)
Magnesium: 2.2 mg/dL (ref 1.7–2.4)

## 2020-07-19 LAB — BASIC METABOLIC PANEL
Anion gap: 9 (ref 5–15)
Anion gap: 10 (ref 5–15)
BUN: 17 mg/dL (ref 8–23)
BUN: 20 mg/dL (ref 8–23)
CO2: 20 mmol/L — ABNORMAL LOW (ref 22–32)
CO2: 19 mmol/L — ABNORMAL LOW (ref 22–32)
Calcium: 7.6 mg/dL — ABNORMAL LOW (ref 8.9–10.3)
Calcium: 7.9 mg/dL — ABNORMAL LOW (ref 8.9–10.3)
Chloride: 106 mmol/L (ref 98–111)
Chloride: 105 mmol/L (ref 98–111)
Creatinine, Ser: 1.34 mg/dL — ABNORMAL HIGH (ref 0.61–1.24)
Creatinine, Ser: 1.77 mg/dL — ABNORMAL HIGH (ref 0.61–1.24)
GFR, Estimated: 56 mL/min — ABNORMAL LOW (ref >60)
GFR, Estimated: 40 mL/min — ABNORMAL LOW (ref >60)
Glucose, Bld: 168 mg/dL — ABNORMAL HIGH (ref 70–99)
Glucose, Bld: 174 mg/dL — ABNORMAL HIGH (ref 70–99)
Potassium: 4.1 mmol/L (ref 3.5–5.1)
Potassium: 3.9 mmol/L (ref 3.5–5.1)
Sodium: 135 mmol/L (ref 135–145)
Sodium: 134 mmol/L — ABNORMAL LOW (ref 135–145)

## 2020-07-19 MED ORDER — ASPIRIN EC 81 MG PO TBEC
81.0000 mg | DELAYED_RELEASE_TABLET | Freq: Every evening | ORAL | Status: DC
  Administered 2020-07-20 – 2020-07-22 (×3): 81 mg via ORAL
  Filled 2020-07-19 (×3): qty 1

## 2020-07-19 MED ORDER — ATORVASTATIN CALCIUM 80 MG PO TABS
80.0000 mg | ORAL_TABLET | Freq: Every evening | ORAL | Status: DC
  Administered 2020-07-22: 80 mg via ORAL
  Filled 2020-07-19: qty 1

## 2020-07-19 MED ORDER — FUROSEMIDE 10 MG/ML IJ SOLN
20.0000 mg | Freq: Two times a day (BID) | INTRAMUSCULAR | Status: DC
  Administered 2020-07-19 – 2020-07-20 (×4): 20 mg via INTRAVENOUS
  Filled 2020-07-19 (×4): qty 2

## 2020-07-19 MED ORDER — HALOPERIDOL LACTATE 5 MG/ML IJ SOLN: 2.0000 mg | Freq: Once | INTRAMUSCULAR | Status: AC

## 2020-07-19 MED ORDER — WARFARIN SODIUM 2.5 MG PO TABS
2.5000 mg | ORAL_TABLET | Freq: Every day | ORAL | Status: DC
  Administered 2020-07-19 – 2020-07-20 (×2): 2.5 mg via ORAL
  Filled 2020-07-19 (×2): qty 1

## 2020-07-19 MED ORDER — ~~LOC~~ CARDIAC SURGERY, PATIENT & FAMILY EDUCATION: Freq: Once | Status: AC

## 2020-07-19 MED ORDER — WARFARIN - PHYSICIAN DOSING INPATIENT: Freq: Every day | Status: DC

## 2020-07-19 MED ORDER — FA-PYRIDOXINE-CYANOCOBALAMIN 2.5-25-2 MG PO TABS
1.0000 | ORAL_TABLET | Freq: Every day | ORAL | Status: DC
  Administered 2020-07-20 – 2020-07-23 (×4): 1 via ORAL
  Filled 2020-07-19 (×4): qty 1

## 2020-07-19 MED ORDER — POTASSIUM CHLORIDE CRYS ER 20 MEQ PO TBCR
20.0000 meq | EXTENDED_RELEASE_TABLET | Freq: Two times a day (BID) | ORAL | Status: DC
  Administered 2020-07-20 (×2): 20 meq via ORAL
  Filled 2020-07-19 (×2): qty 1

## 2020-07-19 MED ORDER — POTASSIUM CHLORIDE CRYS ER 20 MEQ PO TBCR
40.0000 meq | EXTENDED_RELEASE_TABLET | Freq: Once | ORAL | Status: AC
  Administered 2020-07-19: 40 meq via ORAL
  Filled 2020-07-19: qty 2

## 2020-07-19 MED ORDER — INSULIN ASPART 100 UNIT/ML ~~LOC~~ SOLN
0.0000 [IU] | SUBCUTANEOUS | Status: DC
  Administered 2020-07-19 (×2): 2 [IU] via SUBCUTANEOUS

## 2020-07-19 MED ORDER — ASPIRIN EC 81 MG PO TBEC: 81.0000 mg | DELAYED_RELEASE_TABLET | Freq: Every day | ORAL | Status: DC

## 2020-07-19 MED ORDER — FA-PYRIDOXINE-CYANOCOBALAMIN 2.5-25-2 MG PO TABS: 1.0000 | ORAL_TABLET | Freq: Every day | ORAL | Status: DC

## 2020-07-19 MED ORDER — HALOPERIDOL LACTATE 5 MG/ML IJ SOLN
INTRAMUSCULAR | Status: AC
  Administered 2020-07-19: 2 mg via INTRAVENOUS
  Filled 2020-07-19: qty 1

## 2020-07-19 MED ORDER — ENOXAPARIN SODIUM 30 MG/0.3ML ~~LOC~~ SOLN
30.0000 mg | Freq: Every day | SUBCUTANEOUS | Status: DC
  Administered 2020-07-20 – 2020-07-22 (×3): 30 mg via SUBCUTANEOUS
  Filled 2020-07-19 (×3): qty 0.3

## 2020-07-19 NOTE — Discharge Summary (Signed)
Physician Discharge Summary  Patient ID: Jesse [name] MRN: [id number] DOB/AGE: Mar 27, 1946 75 y.o.  Admit date: 07/18/2020 Discharge date: 07/23/2020  Referring Provider isJordan, Ander Slade, MD PCP isGutierrez, Garlon Hatchet, MD   Admission Diagnoses:   Mitral insufficiency Hypertension CKD, Stage III Dyslipidemia Obstructive sleep apnea  Discharge Diagnoses:  Principal Problem:   S/P minimally-invasive mitral valve repair Active Problems:   OBSTRUCTIVE SLEEP APNEA   Essential hypertension   Hyperlipidemia   CAD (coronary artery disease)   MVP (mitral valve prolapse)   CKD (chronic kidney disease) stage 3, GFR 30-59 ml/min (HCC)   Mitral regurgitation   S/P MVR (mitral valve repair)  Patient Active Problem List   Diagnosis Date Noted  . S/P minimally-invasive mitral valve repair 07/18/2020  . S/P MVR (mitral valve repair) 07/18/2020  . Cough 06/06/2020  . CKD (chronic kidney disease) stage 3, GFR 30-59 ml/min (HCC) 03/20/2020  . Mitral regurgitation 03/20/2020  . Cerebral amyloid angiopathy ([place]) 03/15/2020  . Fall with injury 03/15/2020  . MVP (mitral valve prolapse) 02/03/2019  . Right sided sciatica 06/24/2018  . Fatigue 09/10/2017  . Imbalance 09/10/2017  . Vitamin D deficiency 04/01/2016  . Renal insufficiency 04/01/2016  . Dyspepsia 06/30/2013  . CAD (coronary artery disease) 05/17/2011  . Hyperlipidemia 04/30/2011  . OBSTRUCTIVE SLEEP APNEA 10/24/2009  . RHINITIS, CHRONIC 10/24/2009  . Prediabetes 02/18/2007  . DEPRESSION 09/19/2006  . Essential hypertension 09/19/2006   Discharged Condition: good   History of Present Illness:  [name] with history of coronary artery disease, mitral valve prolapse with mitral regurgitation, hypertension, and hyperlipidemia who has been referred for surgical consultation to discuss treatment options for management of mitral valve prolapse with severe mitral regurgitation.   Patient's cardiac history  dates back to 2012 when he presented with acute non-ST segment elevation myocardial infarction.  He was found to have multivessel coronary artery disease and was treated with PCI and stenting of the right coronary artery using drug-eluting stent.  He had residual moderate coronary artery disease in the left coronary system that was treated medically.  He has been followed intermittently ever since by Dr. [name].  In 2019 the patient was first noted to have a systolic murmur on physical exam.  Echocardiogram performed at that time revealed normal left ventricular systolic function with mitral valve prolapse and moderate mitral regurgitation, all of which was new in comparison with previous echocardiogram performed in 2012.   Follow-up echocardiogram performed February 15, 2020 revealed mitral valve prolapse with severe mitral regurgitation.  Left ventricular systolic function remain normal with ejection fraction estimated 60 to 65%.  Patient was recently seen in follow-up by Dr. [name] and complained of decreased energy without any associated symptoms of chest discomfort or shortness of breath.  Follow-up diagnostic cardiac catheterization revealed nonobstructive coronary artery disease with continued patency of stent in the right coronary artery.  There was mildly elevated left heart filling pressures and mild pulmonary hypertension.  Transesophageal echocardiogram was performed June 28, 2019 confirming the presence of severe prolapse involving middle scallop of the posterior leaflet with severe mitral regurgitation.  There was no significant flow reversal in the pulmonary veins but regurgitant volume was calculated 95 mL with regurgitant fraction 56% and ERO measured 86 mm using PISA.  Left ventricular function appeared normal with ejection fraction estimated 60 to 65%.  The left ventricle was not dilated.  There was trivial tricuspid regurgitation and normal right ventricular size and function.  Cardiothoracic  surgical consultation was requested.  Patient is married and lives out in the country [place] with his wife.  He remains reasonably active physically although he does not exercise at all.  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  He specifically denies any symptoms of exertional shortness of breath or chest discomfort.  His primary complaint is that of decreased energy and just feeling like he does not want to do much.  He has not had any resting shortness of breath, PND, orthopnea, or lower extremity edema.  He denies any palpitations, dizzy spells, or syncope.   [name] with plans to proceed with elective mitral valve repair tomorrow.  He was originally seen in consultation on July 03, 2020.  He reports no new problems or complaints over the last couple of weeks and he is eager to proceed with elective surgical intervention.  He specifically denies any symptoms of shortness of breath, chest discomfort, fever, chills, productive cough.  Hospital Course:   Patient was admitted electively on 07/18/2020 and taken to the operating room at which time he underwent minimally invasive mitral valve repair with complex valvuloplasty including artificial Gore-Tex neocord placement x6 and Sorin memo 4D ring angioplasty(size 53mm, catalog # [id number], serial # [id number]).  This procedure was done by Dr. Roxy Manns.  He tolerated it well and was taken to the surgical intensive care unit in stable condition.  The patient was extubated in the operating room. The patient has maintained stable hemodynamics with AV pacing support.  He did have some notable confusion and agitation overnight but it improved by morning.  He does have an expected acute blood loss anemia which is being monitored  clinically. The monitoring lines were removed on the first post-op day and he was mobilized. He had return a a slow sinus rhythm on the first post-op day so we continued atrially pacing. By the second post-op day his sinus node function had normalized and pacing was discontinued.  Anticoagulation with Coumadin was begun on the evening of the first post-op day.  The INR was monitored daily. Mr. Zeimet developed some confusion with agitation in the early morning hours of POD1. He was treated with IV haloperidol with appropriate response. He was lucid and cooperative for the rest of the day after a few hours of sleep but then had another episode during the night on POD-1. His agitation and confusion had completely cleared by the third post-op day. POD 4 we continued to titrate his coumadin since his INR has not bumped yet. We discontinued all chest tubes and wires. He was below his preop weight so we discontinued his diuretic. At that point, he was transferred to 4E Progressive Care. Activity and diet were advanced and well tolerated. Today, he is tolerated room air, his incision is healing well, he is ambulating with limited assistance and he is ready to discharge home with his wife.    Consults: None  Significant Diagnostic Studies:   EXAM: PORTABLE CHEST 1 VIEW  COMPARISON:  07/19/2020.  FINDINGS: Two right chest tubes in stable position. Prior cardiac valve replacement. Cardiomegaly. No pulmonary venous congestion. Low lung volumes with bibasilar atelectasis again noted. Mild left base infiltrate cannot be excluded. Small left pleural effusion cannot be excluded. No pneumothorax.  IMPRESSION: 1. Two right chest tubes in stable  position. No pneumothorax. 2. Low lung volumes with bibasilar atelectasis again noted. Mild left base infiltrate cannot be excluded. Small left pleural effusion cannot be excluded.   Electronically Signed   By: Marcello Moores  Register   On: 07/20/2020  06:06  Treatments:  CARDIOTHORACIC SURGERY OPERATIVE NOTE  Date of Procedure:                07/18/2020  Preoperative Diagnosis:      Severe Mitral Regurgitation  Postoperative Diagnosis:    Same  Procedure:        Minimally-Invasive Mitral Valve Repair             Complex valvuloplasty including artificial Gore-tex neochord placement x6             Sorin Memo 4D Ring Annuloplasty (size 33mm, catalog # [id number], serial # [id number])               Surgeon:        Valentina Gu. Roxy Manns, MD  Assistant:       John Giovanni, PA-C  Anesthesia:    Rochele Pages, DO  Operative Findings: ? 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 Rupture primary chordae tendinae with flail portion of P2 segment of posterior leaflet  ? Type II dysfunction with severe mitral regurgitation ? Normal left ventricular systolic function ? No residual mitral regurgitation after successful valve repair  Discharge Exam: Blood pressure (!) 116/59, pulse 87, temperature 98.2 F (36.8 C), temperature source Oral, resp. rate 14, height 5\' 10"  (1.778 m), weight 96.8 kg, SpO2 94 %.  General appearance: alert, cooperative and no distress Heart: regular rate and rhythm, S1, S2 normal, no murmur, click, rub or gallop Lungs: clear to auscultation bilaterally Abdomen: soft, non-tender; bowel sounds normal; no masses,  no organomegaly Extremities: extremities normal, atraumatic, no cyanosis or edema Wound: clean and dry   Disposition: Discharge disposition: 01-Home or Self Care       Discharge Instructions    AMB Referral to Cardiac Rehabilitation - Phase II   Complete by: As directed    Diagnosis: Valve Repair   Valve: Mitral   After initial evaluation and assessments completed: Virtual Based Care may be provided alone or in conjunction with Phase 2 Cardiac Rehab based on patient barriers.: Yes   Amb Referral to Cardiac Rehabilitation   Complete by: As directed     Diagnosis: Valve Repair   Valve: Mitral   After initial evaluation and assessments completed: Virtual Based Care may be provided alone or in conjunction with Phase 2 Cardiac Rehab based on patient barriers.: Yes     Allergies as of 07/23/2020      Reactions   Hyzaar [losartan Potassium-hctz] Other (See Comments)   Renal failure   Paroxetine    REACTION: Diarrhea   Sertraline Hcl    REACTION: Diarrhea      Medication List    STOP taking these medications   amLODipine 5 MG tablet Commonly known as: NORVASC     TAKE these medications   aspirin EC 81 MG tablet Take 81 mg by mouth every evening. Swallow whole.   atorvastatin 80 MG tablet Commonly known as: LIPITOR Take 1 tablet (80 mg total) by mouth daily. What changed: when to take this   carvedilol 12.5 MG tablet Commonly known as: COREG Take 1 tablet (12.5 mg total) by mouth 2 (two) times daily.   glucose blood test strip Use to test sugar daily AS NEEDED   nitroGLYCERIN  0.4 MG SL tablet Commonly known as: NITROSTAT Place 1 tablet (0.4 mg total) under the tongue every 5 (five) minutes as needed for chest pain.   traMADol 50 MG tablet Commonly known as: ULTRAM Take 1 tablet (50 mg total) by mouth every 6 (six) hours as needed for moderate pain.   vitamin B-12 1000 MCG tablet Commonly known as: CYANOCOBALAMIN Take 1,000 mcg by mouth every evening.   Vitamin D3 25 MCG (1000 UT) Caps Take 1 capsule (1,000 Units total) by mouth daily. What changed: when to take this   warfarin 5 MG tablet Commonly known as: COUMADIN Take 1 tablet (5 mg total) by mouth daily at 4 PM.       Follow-up Information    [facility]. Go on 07/26/2020.   Specialty: Cardiology Why: Your appointment with the [facility] for a PT / INR blood test will be at 10:30am. Contact information: [address] [place]       [facility]  [place]. Go on 07/31/2020.   Specialty: Cardiothoracic Surgery Why: Your appointment is at 1:30pm. Please arrive 30 minutes early for a chest x-ray to be performed by [facility] located on the first floor of the same building.  Contact information: [place], [place]       Abigail Butts., PA-C. Go on 08/07/2020.   Specialties: Physician Assistant, Cardiology Why: Your appointment is at 2:15pm. Contact information: [address] [place] [address] [phone]        [place] on 09/01/2020.   Specialty: Cardiology Why: Your follow up ECHOCARDIOGRAM will be at 10:35am.  Contact information: [address] [place]       [name], [name], MD. Go on 09/25/2020.   Specialty: Cardiology Why: Your appointment is at [place] information: [place] [address] [phone]              The patient has been discharged on:   1.Beta Blocker:  Yes [ x  ]                              No   [   ]                              If No, reason:  2.Ace Inhibitor/ARB: Yes [   ]                                     No  [  x  ]                                     If No, reason: BP has been fine without  3.Statin:   Yes [x   ]                  No  [   ]                  If No, reason:  4.Ecasa:  Yes  [ x  ]  No   [   ]                  If No, reason:     Signed: Elgie Collard 07/23/2020, 9:51 AM

## 2020-07-19 NOTE — Plan of Care (Signed)
  Problem: Safety: Goal: Non-violent Restraint(s) Outcome: Progressing   Problem: Safety: Goal: Non-violent Restraint(s) Outcome: Progressing   

## 2020-07-19 NOTE — Progress Notes (Signed)
EVENING ROUNDS NOTE :     Jesse [name]       Jesse [name],Jesse [name] [id number]             [phone]                 1 Day Post-Op Procedure(s) (LRB): MINIMALLY INVASIVE MITRAL VALVE REPAIR (MVR) USING 4D MEMO RING SIZE 32MM (Right) TRANSESOPHAGEAL ECHOCARDIOGRAM (TEE) (N/A)   Total Length of Stay:  LOS: 1 day  Events:   No events Resting comfortably    BP (!) 142/66   Pulse 70   Temp 98 F (36.7 C) (Oral)   Resp (!) 24   Ht 5\' 10"  (1.778 m)   Wt 104.5 kg   SpO2 93%   BMI 33.06 kg/m         . sodium chloride 250 mL (07/19/20 0600)  . cefUROXime (ZINACEF)  IV Stopped (07/19/20 3202)  . lactated ringers Stopped (07/18/20 1554)  . lactated ringers Stopped (07/18/20 1554)    I/O last 3 completed shifts: In: 5882.6 [P.O.:100; I.V.:3658.9; Blood:369; IV Piggyback:1754.6] Out: 3343 [Urine:2460; Blood:1032; Chest Tube:660]   CBC Latest Ref Rng & Units 07/19/2020 07/18/2020 07/18/2020  WBC 4.0 - 10.5 K/uL 13.0(H) 13.8(H) 12.5(H)  Hemoglobin 13.0 - 17.0 g/dL 9.1(L) 10.8(L) 10.1(L)  Hematocrit 39.0 - 52.0 % 26.1(L) 31.4(L) 28.8(L)  Platelets 150 - 400 K/uL 102(L) 107(L) 97(L)    BMP Latest Ref Rng & Units 07/19/2020 07/18/2020 07/18/2020  Glucose 70 - 99 mg/dL 168(H) 143(H) -  BUN 8 - 23 mg/dL 17 13 -  Creatinine 0.61 - 1.24 mg/dL 1.34(H) 1.25(H) -  BUN/Creat Ratio 10 - 24 - - -  Sodium 135 - 145 mmol/L 135 135 141  Potassium 3.5 - 5.1 mmol/L 4.1 4.5 3.8  Chloride 98 - 111 mmol/L 106 105 -  CO2 22 - 32 mmol/L 20(L) 17(L) -  Calcium 8.9 - 10.3 mg/dL 7.6(L) 7.5(L) -    ABG    Component Value Date/Time   PHART 7.386 07/18/2020 1505   PCO2ART 38.1 07/18/2020 1505   PO2ART 164 (H) 07/18/2020 1505   HCO3 23.2 07/18/2020 1505   TCO2 24 07/18/2020 1505   ACIDBASEDEF 2.0 07/18/2020 1505   O2SAT 99.0 07/18/2020 1505       Jesse Bouillon, MD 07/19/2020 6:03 PM

## 2020-07-19 NOTE — Progress Notes (Addendum)
[facility] DAILY ICU PROGRESS NOTE                   [place]            [place] [address]          [phone]   1 Day Post-Op Procedure(s) (LRB): MINIMALLY INVASIVE MITRAL VALVE REPAIR (MVR) USING 4D MEMO RING SIZE 32MM (Right) TRANSESOPHAGEAL ECHOCARDIOGRAM (TEE) (N/A)  Total Length of Stay:  LOS: 1 day   Subjective: Had confusion with agitation through the night.  Only slept a few hours this morning after a second dose of Haldol.  Currently awake and alert. Cooperates with exam.  Says he feels ok and does not have pain.   Objective: Vital signs in last 24 hours: Temp:  [95.9 F (35.5 C)-98.78 F (37.1 C)] 98.78 F (37.1 C) (03/09 0300) Pulse Rate:  [69-82] 80 (03/09 0400) Cardiac Rhythm: A-V Sequential paced (03/09 0000) Resp:  [5-24] 13 (03/09 0400) BP: (105-143)/(58-97) 143/66 (03/09 0400) SpO2:  [97 %-100 %] 100 % (03/09 0400) Arterial Line BP: (96-151)/(51-78) 128/56 (03/09 0400) Weight:  [104.5 kg] 104.5 kg (03/09 0600)  Filed Weights   07/18/20 0617 07/19/20 0600  Weight: 99.3 kg 104.5 kg    Weight change: 5.162 kg   Hemodynamic parameters for last 24 hours: PAP: (12-42)/(3-12) 12/3 CO:  [3.6 L/min-4.1 L/min] 4.1 L/min CI:  [1.6 L/min/m2-1.9 L/min/m2] 1.9 L/min/m2  Intake/Output from previous day: 03/08 0701 - 03/09 0700 In: 5742.6 [I.V.:3618.9; Blood:369; IV Piggyback:1754.6] Out: 9518 [Urine:2460; Blood:1032; Chest Tube:660]  Intake/Output this shift: No intake/output data recorded.  Current Meds: Scheduled Meds: . acetaminophen  1,000 mg Oral Q6H  . aspirin EC  325 mg Oral Daily  . [START ON 07/20/2020] aspirin EC  81 mg Oral Daily  . bisacodyl  10 mg Oral Daily   Or  . bisacodyl  10 mg Rectal Daily  . Chlorhexidine Gluconate Cloth  6 each Topical Daily  . docusate sodium  200 mg Oral Daily  . [START ON 07/20/2020] enoxaparin (LOVENOX) injection  30 mg Subcutaneous QHS  . insulin aspart  0-24 Units Subcutaneous Q4H  . mouth  rinse  15 mL Mouth Rinse BID  . pantoprazole  40 mg Oral Daily  . sodium chloride flush  3 mL Intravenous Q12H   Continuous Infusions: . sodium chloride    . albumin human Stopped (07/18/20 2349)  . cefUROXime (ZINACEF)  IV Stopped (07/18/20 2124)  . dexmedetomidine (PRECEDEX) IV infusion 0 mcg/kg/hr (07/18/20 1502)  . lactated ringers Stopped (07/18/20 1554)  . lactated ringers Stopped (07/18/20 1554)  . nitroGLYCERIN Stopped (07/18/20 2224)   PRN Meds:.albumin human, morphine injection, ondansetron (ZOFRAN) IV, oxyCODONE, sodium chloride flush, traMADol  General appearance: alert and cooperative Neurologic: No gross deficits Heart: AV paced with good capture. Pacer rate dialed down, no escape rhythm at pacer rate of 45.  Lungs: breath sounds are clear, CT drainage moderate. Abdomen: soft, non-tender, active bowel sounds Extremities: all warm and well perfused. Femoral cannulation sites are soft and dry.  Wound: the right chest incision is covered with a dry dressing.   Lab Results: CBC: Recent Labs    07/18/20 2028 07/19/20 0413  WBC 13.8* 13.0*  HGB 10.8* 9.1*  HCT 31.4* 26.1*  PLT 107* 102*   BMET:  Recent Labs    07/18/20 2028 07/19/20 0413  NA 135 135  K 4.5 4.1  CL 105 106  CO2 17* 20*  GLUCOSE 143* 168*  BUN 13 17  CREATININE 1.25* 1.34*  CALCIUM 7.5* 7.6*    CMET: Lab Results  Component Value Date   WBC 13.0 (H) 07/19/2020   HGB 9.1 (L) 07/19/2020   HCT 26.1 (L) 07/19/2020   PLT 102 (L) 07/19/2020   GLUCOSE 168 (H) 07/19/2020   CHOL 125 08/12/2019   TRIG 97 08/12/2019   HDL 41 08/12/2019   LDLDIRECT 169.6 07/13/2009   LDLCALC 66 08/12/2019   ALT 25 07/14/2020   AST 25 07/14/2020   NA 135 07/19/2020   K 4.1 07/19/2020   CL 106 07/19/2020   CREATININE 1.34 (H) 07/19/2020   BUN 17 07/19/2020   CO2 20 (L) 07/19/2020   TSH 1.49 09/10/2017   PSA 1.19 04/01/2016   INR 1.5 (H) 07/18/2020   HGBA1C 6.0 (H) 07/14/2020   MICROALBUR 27.4 (H)  04/01/2016      PT/INR:  Recent Labs    07/18/20 1530  LABPROT 17.4*  INR 1.5*   Radiology: [facility] Chest Port 1 View  Result Date: 07/19/2020 CLINICAL DATA:  Open-heart surgery.  Chest tube. EXAM: PORTABLE CHEST 1 VIEW COMPARISON:  07/18/2020. FINDINGS: Interim removal Swan-Ganz catheter. Left IJ sheath in stable position. Two right chest tubes in stable position. Epicardial pacing wires in stable position. Prior cardiac valve replacement. Stable cardiomegaly. Mild bibasilar atelectasis. Tiny left pleural effusion cannot be excluded. IMPRESSION: 1. Interim removal Swan-Ganz catheter. Left IJ sheath in stable position. Two right chest tubes in stable position. No pneumothorax. 2. Mild bibasilar atelectasis. 3. Prior cardiac valve replacement.  Stable cardiomegaly. Electronically Signed   By: Marcello Moores  Register   On: 07/19/2020 06:14   DG Chest Port 1 View  Result Date: 07/18/2020 CLINICAL DATA:  Mitral valve repair EXAM: PORTABLE CHEST 1 VIEW COMPARISON:  07/14/2020 FINDINGS: Left jugular Swan-Ganz catheter in the right ventricle. Mitral valve replacement. 2 right chest tubes in place. No pneumothorax. Right perihilar airspace disease may be atelectasis or infiltrate. No significant effusion. Vascularity normal. IMPRESSION: Swan-Ganz catheter tip in the right ventricle. Two chest tubes on the right. No pneumothorax. Right perihilar airspace disease. Electronically Signed   By: Franchot Gallo M.D.   On: 07/18/2020 15:00     Assessment/Plan: S/P Procedure(s) (LRB): MINIMALLY INVASIVE MITRAL VALVE REPAIR (MVR) USING 4D MEMO RING SIZE 32MM (Right) TRANSESOPHAGEAL ECHOCARDIOGRAM (TEE) (N/A)  --POD1 minimally invasive mitral valve repair for severe MR. Extubated in OR prior to transfer. Stable hemodynamics since surgery. D/C PA catheter, arterial line. Mobilize.   -Confusion / agitation- better this morning after a few hours of sleep per RN. Avoid narcotics.   -Expected acute blood loss anemia- mild  and tolerating well. No indication for intervention. Monitor.   -CKD Stage III- urine output adequate, monitor.   -History of hypertension- on ampldipine and carvedilol prior to admission. Avoiding B-blockers since he is pacer dependent.   -DVT PPX- Mobilize, to start enoxaparin this PM.   Antony Odea, PA-C [phone] 07/19/2020 7:27 AM   I have seen and examined the patient and agree with the assessment and plan as outlined.  Some post-op confusion and agitation overnight, but currently entirely appropriate and overall looking good and reporting no pain or SOB.  Sinus rhythm 50-60 under pacer, AAI pacing w/ stable hemodynamics no drips.  Breathing comfortably w/ O2 sats 94-96% and CXR looks good.   Mobilize  D/C lines  Continue AAI pacing for now  Diuresis  Start Coumadin slowly.  [name], MD 07/19/2020 9:05 AM

## 2020-07-19 NOTE — Hospital Course (Addendum)
HPI:   [name] with history of coronary artery disease, mitral valve prolapse with mitral regurgitation, hypertension, and hyperlipidemia who has been referred for surgical consultation to discuss treatment options for management of mitral valve prolapse with severe mitral regurgitation.   Patient's cardiac history dates back to 2012 when he presented with acute non-ST segment elevation myocardial infarction.  He was found to have multivessel coronary artery disease and was treated with PCI and stenting of the right coronary artery using drug-eluting stent.  He had residual moderate coronary artery disease in the left coronary system that was treated medically.  He has been followed intermittently ever since by Dr. [name].  In 2019 the patient was first noted to have a systolic murmur on physical exam.  Echocardiogram performed at that time revealed normal left ventricular systolic function with mitral valve prolapse and moderate mitral regurgitation, all of which was new in comparison with previous echocardiogram performed in 2012.   Follow-up echocardiogram performed February 15, 2020 revealed mitral valve prolapse with severe mitral regurgitation.  Left ventricular systolic function remain normal with ejection fraction estimated 60 to 65%.  Patient was recently seen in follow-up by Dr. [name] and complained of decreased energy without any associated symptoms of chest discomfort or shortness of breath.  Follow-up diagnostic cardiac catheterization revealed nonobstructive coronary artery disease with continued patency of stent in the right coronary artery.  There was mildly elevated left heart filling pressures and mild pulmonary hypertension.  Transesophageal echocardiogram was performed June 28, 2019 confirming the presence of severe prolapse involving middle scallop of the posterior leaflet with severe mitral regurgitation.  There was no significant flow reversal in the pulmonary  veins but regurgitant volume was calculated 95 mL with regurgitant fraction 56% and ERO measured 86 mm using PISA.  Left ventricular function appeared normal with ejection fraction estimated 60 to 65%.  The left ventricle was not dilated.  There was trivial tricuspid regurgitation and normal right ventricular size and function.  Cardiothoracic surgical consultation was requested.   Patient is married and lives out in the [place] with his wife.  He remains reasonably active physically although he does not exercise at all.  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  He specifically denies any symptoms of exertional shortness of breath or chest discomfort.  His primary complaint is that of decreased energy and just feeling like he does not want to do much.  He has not had any resting shortness of breath, PND, orthopnea, or lower extremity edema.  He denies any palpitations, dizzy spells, or syncope.   [name] with plans to proceed with elective mitral valve repair tomorrow.  He was originally seen in consultation on July 03, 2020.  He reports no new problems or complaints over the last couple of weeks and he is eager to proceed with elective surgical intervention.  He specifically denies any symptoms of shortness of breath, chest discomfort, fever, chills, productive cough.  Hospital course:   Patient was admitted electively on 07/18/2020 and taken to the operating room at which time he underwent minimally invasive mitral valve repair with complex valvuloplasty including artificial Gore-Tex neocord placement x6 and Sorin memo 4D ring angioplasty(size 68mm, catalog # [id number], serial # [id number]).  This procedure was  done by Dr. Roxy Manns.  He tolerated it well and was taken to  the surgical intensive care unit in stable condition.  The patient was extubated in the operating room.  Postoperative hospital course:  The patient has maintained stable hemodynamics with AV pacing support.  He did have some notable confusion and agitation overnight but it improved by morning.  He does have an expected acute blood loss anemia which is being monitored clinically. The monitoring lines were removed on the first post-op day and he was mobilized. He had return a a slow sinus rhythm on the first post-op day so we continued atrially pacing. Anticoagulation with Coumadin was begun on the evening of the firs post-op day.  The INR was monitored daily.

## 2020-07-19 NOTE — Discharge Instructions (Addendum)
[facility] office [phone]   Surgical Mitral Valve Repair, Care After This sheet gives you information about how to care for yourself after your procedure. Your health care provider may also give you more specific instructions. If you have problems or questions, contact your health care provider. What can I expect after the procedure? After the procedure, it is common to have pain around your incision area. Follow these instructions at home: Medicines  Take over-the-counter and prescription medicines only as told by your health care provider.  If you were prescribed an antibiotic medicine, take it as told by your health care provider. Do not stop taking the antibiotic even if you start to feel better.  If you have a mechanical prosthesis, you may be given a blood thinner called warfarin. Follow instructions carefully on how to take this medicine.  Ask your health care provider if the medicine prescribed to you: ? Requires you to avoid driving or using heavy machinery. ? Can cause constipation. You may need to take actions to prevent or treat constipation, such as:  Take over-the-counter or prescription medicines.  Eat foods that are high in fiber, such as beans, whole grains, and fresh fruits and vegetables.  Limit foods that are high in fat and processed sugars, such as fried or sweet foods. Eating and drinking  Limit how much caffeine you drink. Caffeine can affect your heart's rate and rhythm.  Do not drink alcohol if: ? Your health care provider tells you not to drink. ? You are pregnant, may be pregnant, or are planning to become pregnant.  Drink enough fluid to keep your urine pale yellow.  Eat a heart-healthy diet that includes fruits, vegetables, whole grains, low-fat dairy products, and lean proteins like poultry and eggs.      Incision care  Follow instructions from your health care provider about how to take care of your incision. Make sure you: ? Wash your hands  with soap and water before and after you change your bandage (dressing). If soap and water are not available, use hand sanitizer. ? Change your dressing as told by your health care provider. ? Leave stitches (sutures), skin glue, or adhesive strips in place. These skin closures may need to stay in place for 2 weeks or longer. If adhesive strip edges start to loosen and curl up, you may trim the loose edges. Do not remove adhesive strips completely unless your health care provider tells you to do that.  Check your incision area every day for signs of infection. Check for: ? Redness, swelling, or increasing pain. ? Fluid or blood. ? Warmth. ? Pus or a bad smell.   Activity  Return to your normal activities as told by your health care provider. Most patients will need to limit any lifting or strenuous activity for 4-6 weeks.  Avoid sitting for a long time without moving. Get up to take short walks every 1-2 hours.  Do exercises as told by your health care provider.  Do not lift anything that is heavier than 10 lb (4.5 kg), or the limit that you are told, until your health care provider says that it is safe.  Avoid pushing or pulling things with your arms until your health care provider approves. This includes pulling on handrails to help you climb stairs. Lifestyle  Do not use any products that contain nicotine or tobacco, such as cigarettes, e-cigarettes, and chewing tobacco. These can delay incision healing after surgery. If you need help quitting, ask your  health care provider.  Resume sexual activity as told by your health care provider. If you have erectile dysfunction, do not use medicines to treat this condition unless your health care provider approves.  Work with your health care provider to: ? Keep your blood pressure and cholesterol under control. ? Manage any other heart conditions that you have. ? Maintain a healthy weight. Driving and travel  Do not drive until your health  care provider approves. Ask your health care provider when it is safe for you to drive.  Avoid airplane travel for as long as told by your health care provider.  When you travel, bring a list of your medicines and a record of your medical history with you. Carry your medicines with you. General instructions  Do not take baths, swim, or use a hot tub until your health care provider approves. You may take a shower.  Do not strain to have a bowel movement.  Avoid crossing your legs while sitting down.  Check your temperature every day for a fever. A fever may be a sign of infection.  If you are a woman and you plan to become pregnant, talk with your health care provider before you become pregnant.  Wear compression stockings as told by your health care provider. These stockings help to prevent blood clots and reduce swelling in your legs.  Tell all health care providers who care for you that you have an artificial (prosthetic) aortic valve. Also, tell them if you have or have had heart disease or endocarditis.  You will be given a card at discharge. The card indicates the type of prosthetic valve that you have. Keep this card in your wallet or purse for quick reference in case of an emergency.  Keep all follow-up visits as told by your health care provider. This is important. Contact a health care provider if:  You develop a skin rash.  You experience sudden, unexplained changes in your weight.  You have redness, swelling, or increasing pain around your incision.  You have fluid or blood coming from your incision.  Your incision feels warm to the touch.  You have pus or a bad smell coming from your incision.  You have a fever. Get help right away if you:  Develop chest pain that is different from the pain coming from your incision.  Develop shortness of breath or difficulty breathing.  Start to feel light-headed. These symptoms may represent a serious problem that is an  emergency. Do not wait to see if the symptoms will go away. Get medical help right away. Call your local emergency services (911 in the [place] Do not drive yourself to the hospital. Summary  After this procedure, it is common to have pain in the incision area.  Eat a heart-healthy diet. Follow instructions about alcohol use.  Get up to walk often. Avoid pushing and pulling with your arms. Ask what activities are safe for you.  Care for your incision as told by your health care provider. Check it daily for signs of infection.  Get help right away if you have chest pain that is different from your incisions, develop shortness of breath or difficulty breathing, or start to feel light-headed. This information is not intended to replace advice given to you by your health care provider. Make sure you discuss any questions you have with your health care provider. Document Revised: 01/22/2018 Document Reviewed: 01/22/2018 Elsevier Patient Education  2021 [place] on my medicine - Coumadin   (  Warfarin)  This medication education was reviewed with me or my healthcare representative as part of my discharge preparation.   Why was Coumadin prescribed for you? Coumadin was prescribed for you because you have a blood clot or a medical condition that can cause an increased risk of forming blood clots. Blood clots can cause serious health problems by blocking the flow of blood to the heart, lung, or brain. Coumadin can prevent harmful blood clots from forming. As a reminder your indication for Coumadin is:   Blood Clot Prevention After Heart Pump Surgery  What test will check on my response to Coumadin? While on Coumadin (warfarin) you will need to have an INR test regularly to ensure that your dose is keeping you in the desired range. The INR (international normalized ratio) number is calculated from the result of the laboratory test called prothrombin time (PT).  If an INR APPOINTMENT  HAS NOT ALREADY BEEN MADE FOR YOU please schedule an appointment to have this lab work done by your health care provider within 7 days. Your INR goal is a number between:  2 to 3  What  do you need to  know  About  COUMADIN? Take Coumadin (warfarin) exactly as prescribed by your healthcare provider about the same time each day.  DO NOT stop taking without talking to the doctor who prescribed the medication.  Stopping without other blood clot prevention medication to take the place of Coumadin may increase your risk of developing a new clot or stroke.  Get refills before you run out.  What do you do if you miss a dose? If you miss a dose, take it as soon as you remember on the same day then continue your regularly scheduled regimen the next day.  Do not take two doses of Coumadin at the same time.  Important Safety Information A possible side effect of Coumadin (Warfarin) is an increased risk of bleeding. You should call your healthcare provider right away if you experience any of the following: ? Bleeding from an injury or your nose that does not stop. ? Unusual colored urine (red or dark brown) or unusual colored stools (red or black). ? Unusual bruising for unknown reasons. ? A serious fall or if you hit your head (even if there is no bleeding).  Some foods or medicines interact with Coumadin (warfarin) and might alter your response to warfarin. To help avoid this: ? Eat a balanced diet, maintaining a consistent amount of Vitamin K. ? Notify your provider about major diet changes you plan to make. ? Avoid alcohol or limit your intake to 1 drink for women and 2 drinks for men per day. (1 drink is 5 oz. wine, 12 oz. beer, or 1.5 oz. liquor.)  Make sure that ANY health care provider who prescribes medication for you knows that you are taking Coumadin (warfarin).  Also make sure the healthcare provider who is monitoring your Coumadin knows when you have started a new medication including herbals  and non-prescription products.  Coumadin (Warfarin)  Major Drug Interactions  Increased Warfarin Effect Decreased Warfarin Effect  Alcohol (large quantities) Antibiotics (esp. Septra/Bactrim, Flagyl, Cipro) Amiodarone (Cordarone) Aspirin (ASA) Cimetidine (Tagamet) Megestrol (Megace) NSAIDs (ibuprofen, naproxen, etc.) Piroxicam (Feldene) Propafenone (Rythmol SR) Propranolol (Inderal) Isoniazid (INH) Posaconazole (Noxafil) Barbiturates (Phenobarbital) Carbamazepine (Tegretol) Chlordiazepoxide (Librium) Cholestyramine (Questran) Griseofulvin Oral Contraceptives Rifampin Sucralfate (Carafate) Vitamin K   Coumadin (Warfarin) Major Herbal Interactions  Increased Warfarin Effect Decreased Warfarin Effect  Garlic Ginseng Ginkgo biloba Coenzyme Q10  Green tea St. John's wort    Coumadin (Warfarin) FOOD Interactions  Eat a consistent number of servings per week of foods HIGH in Vitamin K (1 serving =  cup)  Collards (cooked, or boiled & drained) Kale (cooked, or boiled & drained) Mustard greens (cooked, or boiled & drained) Parsley *serving size only =  cup Spinach (cooked, or boiled & drained) Swiss chard (cooked, or boiled & drained) Turnip greens (cooked, or boiled & drained)  Eat a consistent number of servings per week of foods MEDIUM-HIGH in Vitamin K (1 serving = 1 cup)  Asparagus (cooked, or boiled & drained) Broccoli (cooked, boiled & drained, or raw & chopped) Brussel sprouts (cooked, or boiled & drained) *serving size only =  cup Lettuce, raw (green leaf, endive, romaine) Spinach, raw Turnip greens, raw & chopped   These websites have more information on Coumadin (warfarin):  FailFactory.se; VeganReport.com.au;

## 2020-07-20 ENCOUNTER — Inpatient Hospital Stay (HOSPITAL_COMMUNITY): Payer: PPO

## 2020-07-20 LAB — BASIC METABOLIC PANEL
Anion gap: 8 (ref 5–15)
BUN: 18 mg/dL (ref 8–23)
CO2: 22 mmol/L (ref 22–32)
Calcium: 8.5 mg/dL — ABNORMAL LOW (ref 8.9–10.3)
Chloride: 106 mmol/L (ref 98–111)
Creatinine, Ser: 1.57 mg/dL — ABNORMAL HIGH (ref 0.61–1.24)
GFR, Estimated: 46 mL/min — ABNORMAL LOW (ref >60)
Glucose, Bld: 165 mg/dL — ABNORMAL HIGH (ref 70–99)
Potassium: 4.2 mmol/L (ref 3.5–5.1)
Sodium: 136 mmol/L (ref 135–145)

## 2020-07-20 LAB — GLUCOSE, CAPILLARY
Glucose-Capillary: 119 mg/dL — ABNORMAL HIGH (ref 70–99)
Glucose-Capillary: 141 mg/dL — ABNORMAL HIGH (ref 70–99)
Glucose-Capillary: 128 mg/dL — ABNORMAL HIGH (ref 70–99)
Glucose-Capillary: 122 mg/dL — ABNORMAL HIGH (ref 70–99)

## 2020-07-20 LAB — CBC
HCT: 26.4 % — ABNORMAL LOW (ref 39.0–52.0)
Hemoglobin: 9.3 g/dL — ABNORMAL LOW (ref 13.0–17.0)
MCH: 34.7 pg — ABNORMAL HIGH (ref 26.0–34.0)
MCHC: 35.2 g/dL (ref 30.0–36.0)
MCV: 98.5 fL (ref 80.0–100.0)
Platelets: 104 10*3/uL — ABNORMAL LOW (ref 150–400)
RBC: 2.68 MIL/uL — ABNORMAL LOW (ref 4.22–5.81)
RDW: 14.4 % (ref 11.5–15.5)
WBC: 14.1 10*3/uL — ABNORMAL HIGH (ref 4.0–10.5)
nRBC: 0 % (ref 0.0–0.2)

## 2020-07-20 LAB — PROTIME-INR
INR: 1.1 (ref 0.8–1.2)
Prothrombin Time: 13.7 seconds (ref 11.4–15.2)

## 2020-07-20 MED ORDER — HALOPERIDOL LACTATE 5 MG/ML IJ SOLN
INTRAMUSCULAR | Status: AC
  Administered 2020-07-20: 2 mg via INTRAVENOUS
  Filled 2020-07-20: qty 1

## 2020-07-20 MED ORDER — QUETIAPINE FUMARATE 25 MG PO TABS: 25.0000 mg | ORAL_TABLET | Freq: Every day | ORAL | Status: DC

## 2020-07-20 MED ORDER — QUETIAPINE FUMARATE 50 MG PO TABS
50.0000 mg | ORAL_TABLET | Freq: Every day | ORAL | Status: DC
  Administered 2020-07-20: 50 mg via ORAL
  Filled 2020-07-20: qty 1

## 2020-07-20 MED ORDER — HALOPERIDOL LACTATE 5 MG/ML IJ SOLN: 2.0000 mg | Freq: Once | INTRAMUSCULAR | Status: AC

## 2020-07-20 MED ORDER — MORPHINE SULFATE (PF) 2 MG/ML IV SOLN: 1.0000 mg | INTRAVENOUS | Status: DC | PRN

## 2020-07-20 MED FILL — Sodium Chloride IV Soln 0.9%: INTRAVENOUS | Qty: 4000 | Status: AC

## 2020-07-20 MED FILL — Heparin Sodium (Porcine) Inj 1000 Unit/ML: INTRAMUSCULAR | Qty: 10 | Status: AC

## 2020-07-20 MED FILL — Albumin, Human Inj 5%: INTRAVENOUS | Qty: 250 | Status: AC

## 2020-07-20 MED FILL — Electrolyte-R (PH 7.4) Solution: INTRAVENOUS | Qty: 7000 | Status: AC

## 2020-07-20 MED FILL — Sodium Bicarbonate IV Soln 8.4%: INTRAVENOUS | Qty: 50 | Status: AC

## 2020-07-20 NOTE — Progress Notes (Signed)
At approx 0430, pt got progressively agitated/confused, tried getting out of bed on their own and pulled on their lines. Reoriented pt but pt still agitated and pulling on lines, called [facility] on call and got orders for Haldol. See MAR. Called pt family to bedside to help reorient pt.

## 2020-07-20 NOTE — Progress Notes (Addendum)
[facility] DAILY ICU PROGRESS NOTE                   [address]            [place] [address]          [phone]   2 Days Post-Op Procedure(s) (LRB): MINIMALLY INVASIVE MITRAL VALVE REPAIR (MVR) USING 4D MEMO RING SIZE 32MM (Right) TRANSESOPHAGEAL ECHOCARDIOGRAM (TEE) (N/A)  Total Length of Stay:  LOS: 2 days   Subjective: Had a good day over all yesterday but only slept about 1 hour last night and again became confused with agitated at about 4:30am. Refused blood draw for AM labs. Sleeping now after IV Haldol 2mg  earlier this morning.  I did not awaken him.   Objective: Vital signs in last 24 hours: Temp:  [98 F (36.7 C)-99.32 F (37.4 C)] 98.5 F (36.9 C) (03/10 0447) Pulse Rate:  [68-80] 69 (03/10 0610) Cardiac Rhythm: Atrial paced (03/09 2330) Resp:  [12-24] 20 (03/10 0610) BP: (88-164)/(54-85) 142/78 (03/10 0610) SpO2:  [90 %-97 %] 95 % (03/10 0610) Arterial Line BP: (135-138)/(49-53) 138/53 (03/09 0900) Weight:  [102.2 kg] 102.2 kg (03/10 0300)  Filed Weights   07/18/20 0617 07/19/20 0600 07/20/20 0300  Weight: 99.3 kg 104.5 kg 102.2 kg    Weight change: -2.35 kg      Intake/Output from previous day: 03/09 0701 - 03/10 0700 In: 1160.1 [P.O.:960; IV Piggyback:200.1] Out: 2200 [Urine:1660; Chest Tube:540]  Intake/Output this shift: No intake/output data recorded.  Current Meds: Scheduled Meds: . acetaminophen  1,000 mg Oral Q6H  . aspirin EC  81 mg Oral QPM  . [START ON 07/22/2020] atorvastatin  80 mg Oral QPM  . bisacodyl  10 mg Oral Daily   Or  . bisacodyl  10 mg Rectal Daily  . Chlorhexidine Gluconate Cloth  6 each Topical Daily  . docusate sodium  200 mg Oral Daily  . enoxaparin (LOVENOX) injection  30 mg Subcutaneous QHS  . folic acid-pyridoxine-cyancobalamin  1 tablet Oral Daily  . furosemide  20 mg Intravenous BID  . insulin aspart  0-24 Units Subcutaneous Q4H  . mouth rinse  15 mL Mouth Rinse BID  . pantoprazole  40 mg Oral  Daily  . potassium chloride  20 mEq Oral BID  . sodium chloride flush  3 mL Intravenous Q12H  . warfarin  2.5 mg Oral q1600  . Warfarin - Physician Dosing Inpatient   Does not apply q1600   Continuous Infusions: . sodium chloride 250 mL (07/19/20 0600)  . cefUROXime (ZINACEF)  IV Stopped (07/19/20 2030)  . lactated ringers Stopped (07/18/20 1554)  . lactated ringers Stopped (07/18/20 1554)   PRN Meds:.morphine injection, ondansetron (ZOFRAN) IV, oxyCODONE, sodium chloride flush, traMADol  General appearance: resting comfortably Neurologic: not examined Heart: A- paced at 70 with good capture.  Lungs: O2 sats 97-100% on 2L ncO2.  CT drainage is thin and serous, volume is tapering off. CXR is stable, no unexpected changes. Abdomen: not examined  Wound: the right chest incision is covered with a dry dressing.   Lab Results: CBC: Recent Labs    07/19/20 0413 07/19/20 1748  WBC 13.0* 16.0*  HGB 9.1* 9.1*  HCT 26.1* 26.1*  PLT 102* 111*   BMET:  Recent Labs    07/19/20 0413 07/19/20 1748  NA 135 134*  K 4.1 3.9  CL 106 105  CO2 20* 19*  GLUCOSE 168* 174*  BUN 17 20  CREATININE 1.34*  1.77*  CALCIUM 7.6* 7.9*    CMET: Lab Results  Component Value Date   WBC 16.0 (H) 07/19/2020   HGB 9.1 (L) 07/19/2020   HCT 26.1 (L) 07/19/2020   PLT 111 (L) 07/19/2020   GLUCOSE 174 (H) 07/19/2020   CHOL 125 08/12/2019   TRIG 97 08/12/2019   HDL 41 08/12/2019   LDLDIRECT 169.6 07/13/2009   LDLCALC 66 08/12/2019   ALT 25 07/14/2020   AST 25 07/14/2020   NA 134 (L) 07/19/2020   K 3.9 07/19/2020   CL 105 07/19/2020   CREATININE 1.77 (H) 07/19/2020   BUN 20 07/19/2020   CO2 19 (L) 07/19/2020   TSH 1.49 09/10/2017   PSA 1.19 04/01/2016   INR 1.5 (H) 07/18/2020   HGBA1C 6.0 (H) 07/14/2020   MICROALBUR 27.4 (H) 04/01/2016      PT/INR:  Recent Labs    07/18/20 1530  LABPROT 17.4*  INR 1.5*   Radiology: [facility] Chest Port 1 View  Result Date: 07/20/2020 CLINICAL DATA:   Atelectasis.  MVR. EXAM: PORTABLE CHEST 1 VIEW COMPARISON:  07/19/2020. FINDINGS: Two right chest tubes in stable position. Prior cardiac valve replacement. Cardiomegaly. No pulmonary venous congestion. Low lung volumes with bibasilar atelectasis again noted. Mild left base infiltrate cannot be excluded. Small left pleural effusion cannot be excluded. No pneumothorax. IMPRESSION: 1. Two right chest tubes in stable position. No pneumothorax. 2. Low lung volumes with bibasilar atelectasis again noted. Mild left base infiltrate cannot be excluded. Small left pleural effusion cannot be excluded. Electronically Signed   By: Marcello Moores  Register   On: 07/20/2020 06:06     Assessment/Plan: S/P Procedure(s) (LRB): MINIMALLY INVASIVE MITRAL VALVE REPAIR (MVR) USING 4D MEMO RING SIZE 32MM (Right) TRANSESOPHAGEAL ECHOCARDIOGRAM (TEE) (N/A)  --POD2 minimally invasive mitral valve repair for severe MR. Extubated in OR prior to transfer. Stable hemodynamics since surgery. Low-dose Coumadin started, no labs yet today.  Mobilize as able.   -Confusion / agitation- recurred this morning after being calm and cooperative yesterday. Sleeping now after Haldol 2mg  IV. Will give Seroquel at HS tonight.   -Expected acute blood loss anemia-  tolerating well. No No labs yet today, recheck when he is cooperative  -CKD Stage III- urine output adequate, Creat bumped to 1.77 last PM. Avoid hypotension and monitor.   -History of hypertension- on amlodipine and carvedilol prior to admission. Avoiding B-blockers since he has been bradycardic.   -DVT PPX- Mobilize, continue enoxaparin SQ daily   Antony Odea, PA-C [phone] 07/20/2020 7:20 AM   I have seen and examined the patient and agree with the assessment and plan as outlined.  Had a good day yesterday and clinically doing very well, although patient didn't sleep last night and had another episode of agitation, confusion and some hallucinations early this morning  requiring one dose IV haldol.  Currently sleeping but wakes up and follows commands, denies pain or SOB.  Junctional rhythm under pacer w/ HR 60-70.  Will restart amlodipine for hypertension if BP trends up and continue to hold beta blockers for now.  Mobilize.  Diuresis.  Post op elevated serum creatinine - acute exacerbation of baseline CKD, likely due to prerenal azotemia +/- acute kidney injury caused by ATN, UOP adequate - watch renal function.  Expected post op acute blood loss anemia, stable.  Keep in ICU one more day  [name], MD 07/20/2020 8:34 AM

## 2020-07-20 NOTE — Progress Notes (Signed)
[facility] Evening Rounds  POD #2 s/p MVr Better day Good UOP Alert/oriented CT output still draining  BP (!) 118/42   Pulse (!) 58   Temp 99.7 F (37.6 C) (Oral)   Resp (!) 25   Ht 5\' 10"  (1.778 m)   Wt 102.2 kg   SpO2 95%   BMI 32.31 kg/m  CTA RRR Mild edema  A/p:  Continue present management Likely txfer in am [place] Z. Orvan Seen, [place]

## 2020-07-20 NOTE — Progress Notes (Signed)
OT Cancellation Note  Patient Details Name: Jesse [name] MRN: [id number] DOB: [birth year]   Cancelled Treatment:    Reason Eval/Treat Not Completed: Patient not medically ready.  Patient with increased agitation overnight, nursing requesting to hold OT eval for today.  OT will continue efforts as appropriate.    Richard D Popella 07/20/2020, 8:23 AM

## 2020-07-20 NOTE — Progress Notes (Signed)
PT Cancellation Note  Patient Details Name: Jesse [name] MRN: [id number] DOB: 12-26-1945   Cancelled Treatment:    Reason Eval/Treat Not Completed: Patient not medically ready (pt with significant agitation and this am needing haldol and family present. Pt finally sleeping and RN states to not wake at this time)   [name] B [name] 07/20/2020, 7:36 AM  [place] Pager: [phone] Office: [phone]

## 2020-07-21 ENCOUNTER — Telehealth: Payer: Self-pay | Admitting: Cardiology

## 2020-07-21 LAB — TYPE AND SCREEN
ABO/RH(D): O NEG
Antibody Screen: NEGATIVE
Unit division: 0
Unit division: 0
Unit division: 0
Unit division: 0

## 2020-07-21 LAB — BPAM RBC
Blood Product Expiration Date: 202203082359
Blood Product Expiration Date: 202203102359
Blood Product Expiration Date: 202203122359
Blood Product Expiration Date: 202203122359
ISSUE DATE / TIME: 202203080947
ISSUE DATE / TIME: 202203081216
ISSUE DATE / TIME: 202203091444
Unit Type and Rh: 9500
Unit Type and Rh: 9500
Unit Type and Rh: 9500
Unit Type and Rh: 9500

## 2020-07-21 LAB — BASIC METABOLIC PANEL
Anion gap: 6 (ref 5–15)
BUN: 16 mg/dL (ref 8–23)
CO2: 26 mmol/L (ref 22–32)
Calcium: 8.7 mg/dL — ABNORMAL LOW (ref 8.9–10.3)
Chloride: 106 mmol/L (ref 98–111)
Creatinine, Ser: 1.4 mg/dL — ABNORMAL HIGH (ref 0.61–1.24)
GFR, Estimated: 53 mL/min — ABNORMAL LOW (ref >60)
Glucose, Bld: 115 mg/dL — ABNORMAL HIGH (ref 70–99)
Potassium: 3.9 mmol/L (ref 3.5–5.1)
Sodium: 138 mmol/L (ref 135–145)

## 2020-07-21 LAB — CBC
HCT: 25.8 % — ABNORMAL LOW (ref 39.0–52.0)
Hemoglobin: 9.2 g/dL — ABNORMAL LOW (ref 13.0–17.0)
MCH: 34.8 pg — ABNORMAL HIGH (ref 26.0–34.0)
MCHC: 35.7 g/dL (ref 30.0–36.0)
MCV: 97.7 fL (ref 80.0–100.0)
Platelets: 102 10*3/uL — ABNORMAL LOW (ref 150–400)
RBC: 2.64 MIL/uL — ABNORMAL LOW (ref 4.22–5.81)
RDW: 14.5 % (ref 11.5–15.5)
WBC: 11 10*3/uL — ABNORMAL HIGH (ref 4.0–10.5)
nRBC: 0 % (ref 0.0–0.2)

## 2020-07-21 LAB — PROTIME-INR
INR: 1 (ref 0.8–1.2)
Prothrombin Time: 13.1 seconds (ref 11.4–15.2)

## 2020-07-21 MED ORDER — FUROSEMIDE 40 MG PO TABS: 40.0000 mg | ORAL_TABLET | Freq: Every day | ORAL | Status: DC

## 2020-07-21 MED ORDER — CHLORHEXIDINE GLUCONATE CLOTH 2 % EX PADS
6.0000 | MEDICATED_PAD | Freq: Every day | CUTANEOUS | Status: DC
  Administered 2020-07-21 – 2020-07-22 (×2): 6 via TOPICAL

## 2020-07-21 MED ORDER — POTASSIUM CHLORIDE CRYS ER 20 MEQ PO TBCR
20.0000 meq | EXTENDED_RELEASE_TABLET | Freq: Every day | ORAL | Status: DC
  Filled 2020-07-21: qty 1

## 2020-07-21 MED ORDER — POTASSIUM CHLORIDE CRYS ER 20 MEQ PO TBCR
40.0000 meq | EXTENDED_RELEASE_TABLET | Freq: Every day | ORAL | Status: AC
  Administered 2020-07-21 – 2020-07-22 (×2): 40 meq via ORAL
  Filled 2020-07-21 (×2): qty 2

## 2020-07-21 MED ORDER — ~~LOC~~ CARDIAC SURGERY, PATIENT & FAMILY EDUCATION: Freq: Once | Status: AC

## 2020-07-21 MED ORDER — SODIUM CHLORIDE 0.9% FLUSH
3.0000 mL | Freq: Two times a day (BID) | INTRAVENOUS | Status: DC
  Administered 2020-07-21 – 2020-07-22 (×4): 3 mL via INTRAVENOUS

## 2020-07-21 MED ORDER — QUETIAPINE FUMARATE 25 MG PO TABS
25.0000 mg | ORAL_TABLET | Freq: Every day | ORAL | Status: DC
  Administered 2020-07-21 – 2020-07-22 (×2): 25 mg via ORAL
  Filled 2020-07-21 (×2): qty 1

## 2020-07-21 MED ORDER — WARFARIN SODIUM 2 MG PO TABS
4.0000 mg | ORAL_TABLET | Freq: Every day | ORAL | Status: DC
  Administered 2020-07-21: 4 mg via ORAL
  Filled 2020-07-21: qty 2

## 2020-07-21 MED ORDER — SODIUM CHLORIDE 0.9% FLUSH: 3.0000 mL | INTRAVENOUS | Status: DC | PRN

## 2020-07-21 MED ORDER — FUROSEMIDE 10 MG/ML IJ SOLN
20.0000 mg | Freq: Two times a day (BID) | INTRAMUSCULAR | Status: AC
  Administered 2020-07-21 – 2020-07-22 (×3): 20 mg via INTRAVENOUS
  Filled 2020-07-21 (×3): qty 2

## 2020-07-21 MED ORDER — SODIUM CHLORIDE 0.9 % IV SOLN: 250.0000 mL | INTRAVENOUS | Status: DC | PRN

## 2020-07-21 MED FILL — Lidocaine HCl Local Preservative Free (PF) Inj 2%: INTRAMUSCULAR | Qty: 15 | Status: AC

## 2020-07-21 MED FILL — Potassium Chloride Inj 2 mEq/ML: INTRAVENOUS | Qty: 40 | Status: AC

## 2020-07-21 MED FILL — Heparin Sodium (Porcine) Inj 1000 Unit/ML: INTRAMUSCULAR | Qty: 30 | Status: AC

## 2020-07-21 NOTE — Telephone Encounter (Signed)
[name] is calling stating she is returning a call from the office that Keenan received today. She states Ronalee Belts thought it was Malachy Mood that was calling. I advised her I was unable to find documentation that Malachy Mood called and it was most likely an appointment reminder for his 07/26/20 appt due to those going out 72 hrs in advance. Becky verbalized understanding, but Ronalee Belts requested a message be sent to [facility] and Dr. [name] to confirm it was not one of them attempting to contact him. Please advise.

## 2020-07-21 NOTE — Telephone Encounter (Signed)
Spoke to patient and wife advised I did not call you today.Advised he will be scheduled post hospital appointment with Dr.Jordan before he is discharged from hospital.

## 2020-07-21 NOTE — Telephone Encounter (Signed)
Returned call to patient who states he was returning Renick phone call. Patient would like a message sent to [facility] letting her know that he called back. Advised patient I would forward this message to her. Patient verbalized understanding.

## 2020-07-21 NOTE — Progress Notes (Signed)
CARDIAC REHAB PHASE I   PRE:  Rate/Rhythm: 65 SR  BP:  Sitting: 142/62      SaO2: 95 RA  MODE:  Ambulation: 370 ft   POST:  Rate/Rhythm: 100 ST  BP:  Sitting: 150/72    SaO2: 97 RA  Pt requesting to get OOB for lunch, agreeable to walk first. Pt ambulated 388ft in hallway assist of one with front wheel walker. Pt returned to recliner. Lunch set-up. Encouraged continued ambulation and IS use. Wife at bedside, call bell within reach. Will continue to follow.  [id number] Rufina Falco, RN BSN 07/21/2020 11:37 AM

## 2020-07-21 NOTE — Plan of Care (Signed)
°  Problem: Education: Goal: Knowledge of General Education information will improve Description: Including pain rating scale, medication(s)/side effects and non-pharmacologic comfort measures Outcome: Progressing   Problem: Health Behavior/Discharge Planning: Goal: Ability to manage health-related needs will improve Outcome: Progressing   Problem: Clinical Measurements: Goal: Ability to maintain clinical measurements within normal limits will improve Outcome: Progressing Goal: Will remain free from infection Outcome: Progressing Goal: Diagnostic test results will improve Outcome: Progressing Goal: Respiratory complications will improve Outcome: Progressing Goal: Cardiovascular complication will be avoided Outcome: Progressing   Problem: Activity: Goal: Risk for activity intolerance will decrease Outcome: Progressing   Problem: Nutrition: Goal: Adequate nutrition will be maintained Outcome: Progressing   Problem: Coping: Goal: Level of anxiety will decrease Outcome: Progressing   Problem: Elimination: Goal: Will not experience complications related to bowel motility Outcome: Progressing Goal: Will not experience complications related to urinary retention Outcome: Progressing   Problem: Pain Managment: Goal: General experience of comfort will improve Outcome: Progressing   Problem: Safety: Goal: Ability to remain free from injury will improve Outcome: Progressing   Problem: Skin Integrity: Goal: Risk for impaired skin integrity will decrease Outcome: Progressing   Problem: Education: Goal: Will demonstrate proper wound care and an understanding of methods to prevent future damage Outcome: Progressing Goal: Knowledge of disease or condition will improve Outcome: Progressing Goal: Knowledge of the prescribed therapeutic regimen will improve Outcome: Progressing Goal: Individualized Educational Video(s) Outcome: Progressing   Problem: Activity: Goal: Risk for  activity intolerance will decrease Outcome: Progressing   Problem: Cardiac: Goal: Will achieve and/or maintain hemodynamic stability Outcome: Progressing   Problem: Clinical Measurements: Goal: Postoperative complications will be avoided or minimized Outcome: Progressing   Problem: Respiratory: Goal: Respiratory status will improve Outcome: Progressing   Problem: Skin Integrity: Goal: Wound healing without signs and symptoms of infection Outcome: Progressing Goal: Risk for impaired skin integrity will decrease Outcome: Progressing   Problem: Urinary Elimination: Goal: Ability to achieve and maintain adequate renal perfusion and functioning will improve Outcome: Progressing   Problem: Safety: Goal: Non-violent Restraint(s) Outcome: Progressing   Problem: Safety: Goal: Non-violent Restraint(s) Outcome: Progressing

## 2020-07-21 NOTE — Evaluation (Signed)
Occupational Therapy Evaluation Patient Details Name: Jesse [name] MRN: [id number] DOB: 11-20-1945 Today's Date: 07/21/2020    History of Present Illness 75 yo admitted 3/7 for minimally invasive MVR performed 3/8. Pt with agitation and confusion post operatively. PMhx: CAD, mitral valve prolapse, HTN, HLD   Clinical Impression   Patient admitted for the diagnosis above.  PTA he does endorse falls, but was largely independent with ADL completion.  Deficits impacting independence are listed below.  Currently he is needing up to supervision for all mobility and ADL in the acute setting.  He is hoping to return home Sunday.  OT will follow in the acute setting to maximize his functional status, but no follow up OT is needed.      Follow Up Recommendations  No OT follow up    Equipment Recommendations  None recommended by OT    Recommendations for Other Services       Precautions / Restrictions Precautions Precautions: Fall Restrictions Weight Bearing Restrictions: No      Mobility Bed Mobility Overal bed mobility: Needs Assistance Bed Mobility: Sit to Supine       Sit to supine: Supervision   General bed mobility comments: in chair on arrival    Transfers Overall transfer level: Needs assistance   Transfers: Sit to/from Stand;Stand Pivot Transfers Sit to Stand: Supervision Stand pivot transfers: Supervision       General transfer comment: supervision for chest tube    Balance Overall balance assessment: Needs assistance   Sitting balance-Leahy Scale: Good     Standing balance support: Bilateral upper extremity supported Standing balance-Leahy Scale: Fair Standing balance comment: able to walk limited distance without support but benefits from UE support during gait                           ADL either performed or assessed with clinical judgement   ADL Overall ADL's : Needs assistance/impaired Eating/Feeding: Independent;Sitting    Grooming: Wash/dry hands;Wash/dry face;Oral care;Set up;Sitting           Upper Body Dressing : Supervision/safety;Sitting   Lower Body Dressing: Supervision/safety;Sit to/from stand   Toilet Transfer: Supervision/safety;RW   Toileting- Water quality scientist and Hygiene: Supervision/safety;Sit to/from stand       Functional mobility during ADLs: Supervision/safety;Rolling walker       Vision Baseline Vision/History: Wears glasses Wears Glasses: At all times Patient Visual Report: No change from baseline       Perception     Praxis      Pertinent Vitals/Pain Pain Assessment: No/denies pain     Hand Dominance Right   Extremity/Trunk Assessment Upper Extremity Assessment Upper Extremity Assessment: Overall WFL for tasks assessed   Lower Extremity Assessment Lower Extremity Assessment: Defer to PT evaluation   Cervical / Trunk Assessment Cervical / Trunk Assessment: Normal   Communication Communication Communication: HOH   Cognition Arousal/Alertness: Awake/alert Behavior During Therapy: Impulsive Overall Cognitive Status: Within Functional Limits for tasks assessed                                 General Comments: Patient did well on cognitive screen, but appears to still have some safety concerns and impulsivity.   General Comments       Exercises     Shoulder Instructions      Home Living Family/patient expects to be discharged to:: Private residence Living Arrangements: Spouse/significant other Available Help  at Discharge: Family;Available 24 hours/day Type of Home: House Home Access: Stairs to enter [employer] of Steps: 14   Home Layout: Two level;Bed/bath upstairs Alternate Level Stairs-Number of Steps: 14   Bathroom Shower/Tub: Occupational psychologist: Standard     Home Equipment: Shower seat - built in          Prior Functioning/Environment Level of Independence: Independent         Comments: patient with admitted falls at home.  Generally did not need any assist with ADL.        OT Problem List: Decreased activity tolerance;Impaired balance (sitting and/or standing)      OT Treatment/Interventions: Self-care/ADL training;Balance training;Therapeutic activities    OT Goals(Current goals can be found in the care plan section) Acute Rehab OT Goals Patient Stated Goal: Wants to get back to cutting the yard OT Goal Formulation: With patient Time For Goal Achievement: 08/04/20 Potential to Achieve Goals: Good ADL Goals Pt Will Perform Grooming: with modified independence;standing;sitting Pt Will Perform Lower Body Bathing: with modified independence;sit to/from stand Pt Will Perform Lower Body Dressing: with modified independence;sit to/from stand Pt Will Transfer to Toilet: with modified independence Pt Will Perform Toileting - Clothing Manipulation and hygiene: with modified independence;sit to/from stand  OT Frequency: Min 2X/week   Barriers to D/C:    none noted       Co-evaluation              AM-PAC OT "6 Clicks" Daily Activity     Outcome Measure Help from another person eating meals?: None Help from another person taking care of personal grooming?: None Help from another person toileting, which includes using toliet, bedpan, or urinal?: A Little Help from another person bathing (including washing, rinsing, drying)?: A Little Help from another person to put on and taking off regular upper body clothing?: None Help from another person to put on and taking off regular lower body clothing?: A Little 6 Click Score: 21   End of Session Equipment Utilized During Treatment: Rolling walker  Activity Tolerance: Patient tolerated treatment well Patient left: in bed;with call bell/phone within reach;with family/visitor present  OT Visit Diagnosis: Unsteadiness on feet (R26.81);Other symptoms and signs involving cognitive function                Time:  9163-8466 OT Time Calculation (min): 19 min Charges:  OT General Charges $OT Visit: 1 Visit OT Evaluation $OT Eval Moderate Complexity: 1 Mod  07/21/2020  Rich, OTR/L  Acute Rehabilitation Services  Office:  [place] 07/21/2020, 2:00 PM

## 2020-07-21 NOTE — Progress Notes (Addendum)
T[facility] DAILY ICU PROGRESS NOTE                   [address]            [place] [address]          [id number]   3 Days Post-Op Procedure(s) (LRB): MINIMALLY INVASIVE MITRAL VALVE REPAIR (MVR) USING 4D MEMO RING SIZE 32MM (Right) TRANSESOPHAGEAL ECHOCARDIOGRAM (TEE) (N/A)  Total Length of Stay:  LOS: 3 days   Subjective: Sitting up eating breakfast. Says he feels better today and rested better last night. He has walked several laps in the unit yesterday and this morning.  He has had several BM's since surgery.    Objective: Vital signs in last 24 hours: Temp:  [98.5 F (36.9 C)-99.7 F (37.6 C)] 98.5 F (36.9 C) (03/11 0655) Pulse Rate:  [58-83] 83 (03/11 0700) Cardiac Rhythm: Normal sinus rhythm (03/11 0000) Resp:  [13-25] 17 (03/11 0700) BP: (112-160)/(42-100) 129/100 (03/11 0700) SpO2:  [94 %-99 %] 97 % (03/11 0700) Weight:  [99.4 kg] 99.4 kg (03/11 0635)  Filed Weights   07/19/20 0600 07/20/20 0300 07/21/20 0635  Weight: 104.5 kg 102.2 kg 99.4 kg    Weight change: -2.75 kg      Intake/Output from previous day: 03/10 0701 - 03/11 0700 In: 593 [P.O.:590; I.V.:3] Out: 3440 [Urine:3100; Chest Tube:340]  Intake/Output this shift: No intake/output data recorded.  Current Meds: Scheduled Meds: . acetaminophen  1,000 mg Oral Q6H  . aspirin EC  81 mg Oral QPM  . [START ON 07/22/2020] atorvastatin  80 mg Oral QPM  . bisacodyl  10 mg Oral Daily   Or  . bisacodyl  10 mg Rectal Daily  . Chlorhexidine Gluconate Cloth  6 each Topical Daily  . docusate sodium  200 mg Oral Daily  . enoxaparin (LOVENOX) injection  30 mg Subcutaneous QHS  . folic acid-pyridoxine-cyancobalamin  1 tablet Oral Daily  . furosemide  20 mg Intravenous BID  . mouth rinse  15 mL Mouth Rinse BID  . pantoprazole  40 mg Oral Daily  . potassium chloride  20 mEq Oral BID  . QUEtiapine  50 mg Oral QHS  . sodium chloride flush  3 mL Intravenous Q12H  . warfarin  2.5 mg Oral q1600   . Warfarin - Physician Dosing Inpatient   Does not apply q1600   Continuous Infusions: . sodium chloride 250 mL (07/19/20 0600)  . lactated ringers Stopped (07/18/20 1554)  . lactated ringers Stopped (07/18/20 1554)   PRN Meds:.morphine injection, ondansetron (ZOFRAN) IV, sodium chloride flush, traMADol  General appearance: eating breakfast, no distress, family at the bedside.  Neurologic: intact Heart: SR with PVC's. Brief episode of bradycardia last night with HR in the 40's.  Lungs: O2 sats 97-100% on RA.  CT drainage is thin and serous, 172ml past 12 hours. CXR is stable, no unexpected changes. Abdomen: soft, NT EXTs- minimal peripheral edema.  Wound: the right chest incision is covered with a dry Aquacel dressing.   Lab Results: CBC: Recent Labs    07/20/20 0832 07/21/20 0045  WBC 14.1* 11.0*  HGB 9.3* 9.2*  HCT 26.4* 25.8*  PLT 104* 102*   BMET:  Recent Labs    07/20/20 0832 07/21/20 0045  NA 136 138  K 4.2 3.9  CL 106 106  CO2 22 26  GLUCOSE 165* 115*  BUN 18 16  CREATININE 1.57* 1.40*  CALCIUM 8.5* 8.7*    CMET: Lab Results  Component Value Date   WBC 11.0 (H) 07/21/2020   HGB 9.2 (L) 07/21/2020   HCT 25.8 (L) 07/21/2020   PLT 102 (L) 07/21/2020   GLUCOSE 115 (H) 07/21/2020   CHOL 125 08/12/2019   TRIG 97 08/12/2019   HDL 41 08/12/2019   LDLDIRECT 169.6 07/13/2009   LDLCALC 66 08/12/2019   ALT 25 07/14/2020   AST 25 07/14/2020   NA 138 07/21/2020   K 3.9 07/21/2020   CL 106 07/21/2020   CREATININE 1.40 (H) 07/21/2020   BUN 16 07/21/2020   CO2 26 07/21/2020   TSH 1.49 09/10/2017   PSA 1.19 04/01/2016   INR 1.0 07/21/2020   HGBA1C 6.0 (H) 07/14/2020   MICROALBUR 27.4 (H) 04/01/2016      PT/INR:  Recent Labs    07/21/20 0045  LABPROT 13.1  INR 1.0   Radiology: No results found.   Assessment/Plan: S/P Procedure(s) (LRB): MINIMALLY INVASIVE MITRAL VALVE REPAIR (MVR) USING 4D MEMO RING SIZE 32MM (Right) TRANSESOPHAGEAL  ECHOCARDIOGRAM (TEE) (N/A)  --POD4 minimally invasive mitral valve repair for severe MR. Extubated in OR prior to transfer. Stable hemodynamics since surgery. Progressing well with mobility.  Low-dose Coumadin started, INR 1.0 after coumadin 2.5mg  daily x 2. Increase to 4mg  tonight. Transfer to 4E.   -Confusion / agitation- resolved, calm and cooperative past 24 hours.  Continue Seroquel HS.   -Expected acute blood loss anemia-  tolerating well.Hgb stable at 9. Monitor.   -CKD Stage III- urine output adequate, Creat peaked at 1.77 and is trending down.  Avoid hypotension and monitor. Appears euvolemic and Wt is equivalent to pre-op wt.   -History of hypertension- on amlodipine and carvedilol prior to admission. Avoiding B-blockers since he has been bradycardic. BP control currently adequate off of his medications. Continue to monitor.    -DVT PPX- Mobilize, continue enoxaparin SQ daily     Malon Kindle [phone] 07/21/2020 8:31 AM   I have seen and examined the patient and agree with the assessment and plan as outlined.  Making good progress.  Will d/c pacing wires but leave chest tubes in place 1 more day.  Continue to hold carvedilol for now but restart prior to d/c if rhythm stable, possibly at reduced dose depending on HR.  Transfer 4E.  Anticipate likely d/c home Sunday or Monday  [name], MD 07/21/2020 9:38 AM

## 2020-07-21 NOTE — Progress Notes (Signed)
Patient ID: Jesse [name], male   DOB: 07/06/1945, 75 y.o.   MRN: [id number] T[facility] Evening Rounds:  Hemodynamically stable in sinus rhythm.  sats 98%  CT 100 cc today.  Awaiting bed on 4E.

## 2020-07-21 NOTE — Evaluation (Signed)
Physical Therapy Evaluation Patient Details Name: Jesse [name] MRN: [id number] DOB: 14-Sep-1945 Today's Date: 07/21/2020   History of Present Illness  75 yo admitted 3/7 for minimally invasive MVR performed 3/8. Pt with agitation and confusion post operatively. PMhx: CAD, mitral valve prolapse, HTN, HLD  Clinical Impression  Pt very pleasant and reports being HOH for higher tones. Pt enjoys beekeeping and mowing his yard but has frequently started reaching out for environmental support at home and notices balance deficits. Pt able to perform mobility well with RW for gait and rail for stairs. Currently recommend RW for home but pt may even be able to use cane and will plan to attempt next session. Pt with decreased activity tolerance and balance who will benefit from acute therapy to maximize mobility, safety and function to decrease fall risk.   Post 130/62 HR 72-84 SpO2 on RA 92-96%    Follow Up Recommendations Outpatient PT    Equipment Recommendations  Rolling walker with 5" wheels    Recommendations for Other Services OT consult     Precautions / Restrictions Precautions Precautions: Fall      Mobility  Bed Mobility               General bed mobility comments: in chair on arrival    Transfers Overall transfer level: Needs assistance   Transfers: Sit to/from Stand Sit to Stand: Supervision         General transfer comment: supervision for chest tube  Ambulation/Gait Ambulation/Gait assistance: Min guard Gait Distance (Feet): 400 Feet Assistive device: Rolling walker (2 wheeled) Gait Pattern/deviations: Step-through pattern;Decreased stride length   Gait velocity interpretation: >2.62 ft/sec, indicative of community ambulatory General Gait Details: cues for posture and proximity to RW. Initiated gait without AD with pt automatically reaching for environmental support and reports he does this frequently at home. Transitioned to RW with good stability and  speed able to walk entire unit  Stairs Stairs: Yes Stairs assistance: Supervision Stair Management: Alternating pattern;Forwards;One rail Right Number of Stairs: 14 General stair comments: pt with good stability on stairs with use of rail, supervision for safety to manage CT  Wheelchair Mobility    Modified Rankin (Stroke Patients Only)       Balance Overall balance assessment: Needs assistance   Sitting balance-Leahy Scale: Good       Standing balance-Leahy Scale: Good Standing balance comment: able to walk limited distance without support but benefits from UE support during gait                             Pertinent Vitals/Pain Pain Assessment: No/denies pain    Home Living Family/patient expects to be discharged to:: Private residence Living Arrangements: Spouse/significant other Available Help at Discharge: Family;Available 24 hours/day Type of Home: House Home Access: Stairs to enter   [employer] of Steps: 14 Home Layout: Two level;Bed/bath upstairs Home Equipment: Shower seat - built in      Prior Function Level of Independence: Independent         Comments: pt admittedly holds onto walls and furniture at home with a few falls.     Hand Dominance        Extremity/Trunk Assessment   Upper Extremity Assessment Upper Extremity Assessment: Overall WFL for tasks assessed    Lower Extremity Assessment Lower Extremity Assessment: Generalized weakness    Cervical / Trunk Assessment Cervical / Trunk Assessment: Normal  Communication   Communication: [facility]  Cognition Arousal/Alertness: Awake/alert Behavior During Therapy: WFL for tasks assessed/performed Overall Cognitive Status: Within Functional Limits for tasks assessed                                        General Comments      Exercises     Assessment/Plan    PT Assessment Patient needs continued PT services  PT Problem List Decreased  mobility;Decreased activity tolerance;Decreased balance;Decreased knowledge of use of DME       PT Treatment Interventions Gait training;Balance training;Functional mobility training;Therapeutic activities;Patient/family education;DME instruction;Therapeutic exercise    PT Goals (Current goals can be found in the Care Plan section)  Acute Rehab PT Goals Patient Stated Goal: return to beekeeping PT Goal Formulation: With patient Time For Goal Achievement: 08/04/20 Potential to Achieve Goals: Good    Frequency Min 3X/week   Barriers to discharge        Co-evaluation               AM-PAC PT "6 Clicks" Mobility  Outcome Measure Help needed turning from your back to your side while in a flat bed without using bedrails?: None Help needed moving from lying on your back to sitting on the side of a flat bed without using bedrails?: None Help needed moving to and from a bed to a chair (including a wheelchair)?: None Help needed standing up from a chair using your arms (e.g., wheelchair or bedside chair)?: A Little Help needed to walk in hospital room?: A Little Help needed climbing 3-5 steps with a railing? : None 6 Click Score: 22    End of Session Equipment Utilized During Treatment: Gait belt Activity Tolerance: Patient tolerated treatment well Patient left: in chair;with call bell/phone within reach Nurse Communication: Mobility status PT Visit Diagnosis: Other abnormalities of gait and mobility (R26.89);Unsteadiness on feet (R26.81)    Time: 1610-9604 PT Time Calculation (min) (ACUTE ONLY): 30 min   Charges:   PT Evaluation $PT Eval Moderate Complexity: 1 Mod PT Treatments $Gait Training: 8-22 mins        Svara Twyman P, PT Acute Rehabilitation Services Pager: [phone] Office: [place] 07/21/2020, 11:48 AM

## 2020-07-21 NOTE — Progress Notes (Signed)
Epicardial pacing wires removed per order/protocol at 1010. BP checked q15 mins x 4 per protocol and patient maintained on bedrest for 1 hour after removal. All wires intact upon removal. No bleeding from exit sites. Gauze dressing applied to site. Patient has no c/o pain. BP stable.

## 2020-07-22 ENCOUNTER — Inpatient Hospital Stay (HOSPITAL_COMMUNITY): Payer: PPO

## 2020-07-22 LAB — BASIC METABOLIC PANEL
Anion gap: 7 (ref 5–15)
BUN: 15 mg/dL (ref 8–23)
CO2: 28 mmol/L (ref 22–32)
Calcium: 8.7 mg/dL — ABNORMAL LOW (ref 8.9–10.3)
Chloride: 103 mmol/L (ref 98–111)
Creatinine, Ser: 1.16 mg/dL (ref 0.61–1.24)
GFR, Estimated: >60 mL/min (ref >60)
Glucose, Bld: 96 mg/dL (ref 70–99)
Potassium: 3.9 mmol/L (ref 3.5–5.1)
Sodium: 138 mmol/L (ref 135–145)

## 2020-07-22 LAB — CBC
HCT: 27.5 % — ABNORMAL LOW (ref 39.0–52.0)
Hemoglobin: 9.7 g/dL — ABNORMAL LOW (ref 13.0–17.0)
MCH: 34.8 pg — ABNORMAL HIGH (ref 26.0–34.0)
MCHC: 35.3 g/dL (ref 30.0–36.0)
MCV: 98.6 fL (ref 80.0–100.0)
Platelets: 119 10*3/uL — ABNORMAL LOW (ref 150–400)
RBC: 2.79 MIL/uL — ABNORMAL LOW (ref 4.22–5.81)
RDW: 14.1 % (ref 11.5–15.5)
WBC: 9.2 10*3/uL (ref 4.0–10.5)
nRBC: 0 % (ref 0.0–0.2)

## 2020-07-22 LAB — PROTIME-INR
INR: 1.1 (ref 0.8–1.2)
Prothrombin Time: 13.3 seconds (ref 11.4–15.2)

## 2020-07-22 LAB — MAGNESIUM: Magnesium: 1.8 mg/dL (ref 1.7–2.4)

## 2020-07-22 MED ORDER — METOPROLOL TARTRATE 12.5 MG HALF TABLET
12.5000 mg | ORAL_TABLET | Freq: Two times a day (BID) | ORAL | Status: DC
  Administered 2020-07-22 (×2): 12.5 mg via ORAL
  Filled 2020-07-22 (×2): qty 1

## 2020-07-22 MED ORDER — MAGNESIUM SULFATE 2 GM/50ML IV SOLN
2.0000 g | Freq: Once | INTRAVENOUS | Status: AC
  Administered 2020-07-22: 2 g via INTRAVENOUS
  Filled 2020-07-22: qty 50

## 2020-07-22 MED ORDER — FUROSEMIDE 40 MG PO TABS: 40.0000 mg | ORAL_TABLET | Freq: Every day | ORAL | Status: DC

## 2020-07-22 MED ORDER — WARFARIN SODIUM 5 MG PO TABS
5.0000 mg | ORAL_TABLET | Freq: Every day | ORAL | Status: DC
  Administered 2020-07-22: 5 mg via ORAL
  Filled 2020-07-22: qty 1

## 2020-07-22 NOTE — Progress Notes (Addendum)
4 Days Post-Op Procedure(s) (LRB): MINIMALLY INVASIVE MITRAL VALVE REPAIR (MVR) USING 4D MEMO RING SIZE 32MM (Right) TRANSESOPHAGEAL ECHOCARDIOGRAM (TEE) (N/A) Subjective:  No complaints. Wants chest tubes out so he can mobilize easier.  Objective: Vital signs in last 24 hours: Temp:  [98.4 F (36.9 C)-99.3 F (37.4 C)] 98.4 F (36.9 C) (03/12 1100) Pulse Rate:  [64-88] 71 (03/12 0400) Cardiac Rhythm: Normal sinus rhythm (03/12 0400) Resp:  [7-25] 11 (03/12 0400) BP: (112-146)/(57-123) 114/61 (03/12 0400) SpO2:  [87 %-99 %] 91 % (03/12 0400) Weight:  [96.9 kg] 96.9 kg (03/12 0500)  Hemodynamic parameters for last 24 hours:    Intake/Output from previous day: 03/11 0701 - 03/12 0700 In: 3 [I.V.:3] Out: 845 [Urine:625; Chest Tube:220] Intake/Output this shift: No intake/output data recorded.  Neurologic: intact Heart: regular rate and rhythm, S1, S2 normal, no murmur Lungs: clear to auscultation bilaterally Extremities: no edema Wound: dressing dry  Lab Results: Recent Labs    07/21/20 0045 07/22/20 0029  WBC 11.0* 9.2  HGB 9.2* 9.7*  HCT 25.8* 27.5*  PLT 102* 119*   BMET:  Recent Labs    07/21/20 0045 07/22/20 0029  NA 138 138  K 3.9 3.9  CL 106 103  CO2 26 28  GLUCOSE 115* 96  BUN 16 15  CREATININE 1.40* 1.16  CALCIUM 8.7* 8.7*    PT/INR:  Recent Labs    07/22/20 0029  LABPROT 13.3  INR 1.1   ABG    Component Value Date/Time   PHART 7.386 07/18/2020 1505   HCO3 23.2 07/18/2020 1505   TCO2 24 07/18/2020 1505   ACIDBASEDEF 2.0 07/18/2020 1505   O2SAT 99.0 07/18/2020 1505   CBG (last 3)  Recent Labs    07/20/20 1131 07/20/20 1526 07/20/20 2008  GLUCAP 141* 128* 122*   CXR ok  Assessment/Plan: S/P Procedure(s) (LRB): MINIMALLY INVASIVE MITRAL VALVE REPAIR (MVR) USING 4D MEMO RING SIZE 32MM (Right) TRANSESOPHAGEAL ECHOCARDIOGRAM (TEE) (N/A)  POD 5 Hemodynamically stable in sinus rhythm 80's. Will start low dose Lopressor  Wt is  below preop. Will dc diuretic.  Chest tube output low. DC all of them.  INR has not bumped. Will increase to 5 mg daily.  Continue IS, ambulation  Plan home tomorrow.   LOS: 4 days    Jesse [name] 07/22/2020

## 2020-07-22 NOTE — Progress Notes (Signed)
CARDIAC REHAB PHASE I   PRE:  Rate/Rhythm: 72 SR  BP:  Supine:   Sitting: 125/76  Standing:    SaO2:   MODE:  Ambulation: 700 ft   POST:  Rate/Rhythm: 86 SR  BP:  Supine:   Sitting: 134/82  Standing:    SaO2:  Tolerated ambulation well with rolling walker and chest tube intact.  Preparing patient for discharge tomorrow, completed education re: activity restrictions, exercise precautions, signs of infection, when to call the surgeon, referred to phase II cardiac rehab in [place]. [id number] Liliane Channel RN, BSN 07/22/2020 12:13 PM

## 2020-07-23 LAB — BASIC METABOLIC PANEL
Anion gap: 7 (ref 5–15)
BUN: 16 mg/dL (ref 8–23)
CO2: 28 mmol/L (ref 22–32)
Calcium: 8.9 mg/dL (ref 8.9–10.3)
Chloride: 99 mmol/L (ref 98–111)
Creatinine, Ser: 1.22 mg/dL (ref 0.61–1.24)
GFR, Estimated: >60 mL/min (ref >60)
Glucose, Bld: 111 mg/dL — ABNORMAL HIGH (ref 70–99)
Potassium: 4 mmol/L (ref 3.5–5.1)
Sodium: 134 mmol/L — ABNORMAL LOW (ref 135–145)

## 2020-07-23 LAB — MAGNESIUM: Magnesium: 1.8 mg/dL (ref 1.7–2.4)

## 2020-07-23 LAB — PROTIME-INR
INR: 1 (ref 0.8–1.2)
Prothrombin Time: 13.2 seconds (ref 11.4–15.2)

## 2020-07-23 MED ORDER — TRAMADOL HCL 50 MG PO TABS: 50.0000 mg | ORAL_TABLET | Freq: Four times a day (QID) | ORAL | 0 refills | Status: DC | PRN

## 2020-07-23 MED ORDER — WARFARIN SODIUM 5 MG PO TABS: 5.0000 mg | ORAL_TABLET | Freq: Every day | ORAL | 1 refills | Status: DC

## 2020-07-23 MED ORDER — CARVEDILOL 12.5 MG PO TABS
12.5000 mg | ORAL_TABLET | Freq: Two times a day (BID) | ORAL | Status: DC
  Administered 2020-07-23: 12.5 mg via ORAL
  Filled 2020-07-23: qty 1

## 2020-07-23 NOTE — Care Management (Signed)
07-23-20 Case Manager received order for durable medical equipment (DME) rolling walker for home. Case Manager made the referral with [employer] and the DME will be delivered to the room before transition home. Case Manager confirmed with [facility], that no outpatient will be set up now and the office will follow up and schedule the patient for outpatient cardiac/pulmonary rehab. Case Manager made the family aware. Wife will transport patient home via private vehicle. Bethena Roys, RN,BSN Case Manager

## 2020-07-23 NOTE — Plan of Care (Signed)
Progressing, will continue to monitor.  

## 2020-07-23 NOTE — Progress Notes (Addendum)
      [address]       [place] [address]             [phone]      5 Days Post-Op Procedure(s) (LRB): MINIMALLY INVASIVE MITRAL VALVE REPAIR (MVR) USING 4D MEMO RING SIZE 32MM (Right) TRANSESOPHAGEAL ECHOCARDIOGRAM (TEE) (N/A) Subjective: Looks good this morning. Ready for home.   Objective: Vital signs in last 24 hours: Temp:  [97.8 F (36.6 C)-99.2 F (37.3 C)] 98.2 F (36.8 C) (03/13 0841) Pulse Rate:  [71-87] 87 (03/13 0841) Cardiac Rhythm: Normal sinus rhythm (03/12 1901) Resp:  [12-25] 14 (03/13 0841) BP: (116-156)/(59-81) 116/59 (03/13 0841) SpO2:  [94 %-99 %] 94 % (03/13 0841) Weight:  [96.8 kg] 96.8 kg (03/13 0404)    Intake/Output from previous day: 03/12 0701 - 03/13 0700 In: 293 [P.O.:240; I.V.:3; IV Piggyback:50] Out: 8546 [Urine:1000; Chest Tube:40] Intake/Output this shift: Total I/O In: 240 [P.O.:240] Out: -   General appearance: alert, cooperative and no distress Heart: regular rate and rhythm, S1, S2 normal, no murmur, click, rub or gallop Lungs: clear to auscultation bilaterally Abdomen: soft, non-tender; bowel sounds normal; no masses,  no organomegaly Extremities: extremities normal, atraumatic, no cyanosis or edema Wound: clean and dry  Lab Results: Recent Labs    07/21/20 0045 07/22/20 0029  WBC 11.0* 9.2  HGB 9.2* 9.7*  HCT 25.8* 27.5*  PLT 102* 119*   BMET:  Recent Labs    07/22/20 0029 07/23/20 0310  NA 138 134*  K 3.9 4.0  CL 103 99  CO2 28 28  GLUCOSE 96 111*  BUN 15 16  CREATININE 1.16 1.22  CALCIUM 8.7* 8.9    PT/INR:  Recent Labs    07/23/20 0310  LABPROT 13.2  INR 1.0   ABG    Component Value Date/Time   PHART 7.386 07/18/2020 1505   HCO3 23.2 07/18/2020 1505   TCO2 24 07/18/2020 1505   ACIDBASEDEF 2.0 07/18/2020 1505   O2SAT 99.0 07/18/2020 1505   CBG (last 3)  Recent Labs    07/20/20 1131 07/20/20 1526 07/20/20 2008  GLUCAP 141* 128* 122*    Assessment/Plan: S/P  Procedure(s) (LRB): MINIMALLY INVASIVE MITRAL VALVE REPAIR (MVR) USING 4D MEMO RING SIZE 32MM (Right) TRANSESOPHAGEAL ECHOCARDIOGRAM (TEE) (N/A)  1. CV-NSR in the 80s, BP well controlled, continue asa/statin/bb/coumadin 2. On coumadin INR 1.0, continue 5mg   3. Renal-INR 1.22, potassium 4.0 4. Pulm-CXR with: Streaky airspace opacities within the bilateral lower lung zones and right mid lung zone likely represents atelectasis. Superimposed infection/inflammation cannot be excluded. Chest tubes removed.  5. No leukocytosis 6. H and H 9.7/27.5, expected acute blood loss anemia 7. Endo-blood glucose well controlled  Plan: Discharge today. Instructions reviewed with the patient and wife at the bedside. Creatinine stabilized.     LOS: 5 days    Elgie Collard 07/23/2020   Chart reviewed, patient examined, agree with above. He feels well and wants to go home. INR has not bumped yet. Will continue Coumadin 5 mg daily and have INR checked in a few days.

## 2020-07-26 ENCOUNTER — Ambulatory Visit (INDEPENDENT_AMBULATORY_CARE_PROVIDER_SITE_OTHER): Payer: PPO

## 2020-07-26 ENCOUNTER — Telehealth (HOSPITAL_COMMUNITY): Payer: Self-pay

## 2020-07-26 ENCOUNTER — Other Ambulatory Visit: Payer: Self-pay

## 2020-07-26 DIAGNOSIS — Z9889 Other specified postprocedural states: Secondary | ICD-10-CM | POA: Diagnosis not present

## 2020-07-26 DIAGNOSIS — Z7901 Long term (current) use of anticoagulants: Secondary | ICD-10-CM | POA: Insufficient documentation

## 2020-07-26 DIAGNOSIS — Z5181 Encounter for therapeutic drug level monitoring: Secondary | ICD-10-CM | POA: Diagnosis not present

## 2020-07-26 LAB — POCT INR: INR: 1.2 — AB (ref 2.0–3.0)

## 2020-07-26 NOTE — Patient Instructions (Signed)
Take 2 tablets tonight only and then continue taking 1 tablet daily.  INR in 1 week.  A full discussion of the nature of anticoagulants has been carried out.  A benefit risk analysis has been presented to the patient, so that they understand the justification for choosing anticoagulation at this time. The need for frequent and regular monitoring, precise dosage adjustment and compliance is stressed.  Side effects of potential bleeding are discussed.  The patient should avoid any OTC items containing aspirin or ibuprofen, and should avoid great swings in general diet.  Avoid alcohol consumption.  Call if any signs of abnormal bleeding.  [phone]

## 2020-07-26 NOTE — Telephone Encounter (Signed)
Called and spoke with pt in regards to CR, pt stated he is not interested at this time.   Closed referral 

## 2020-07-27 ENCOUNTER — Telehealth: Payer: Self-pay | Admitting: Family Medicine

## 2020-07-27 NOTE — Telephone Encounter (Signed)
Wife sent mychart message in her chart about husband's increased confusion since recent heart valve surgery.  I offered OV for further evaluation vs return to neurology. plz call wife tomorrow for update on symptoms.

## 2020-07-27 NOTE — Telephone Encounter (Signed)
Spoke to patient's wife.She stated husband has been very confused since he had surgery.Stated before surgery he had a memory problem.Stated since surgery his memory is worse.Stated some days he gets angry and upset with her.He is very forgetful.Advised to call PCP.I will make Dr.Jordan aware.

## 2020-07-28 NOTE — Telephone Encounter (Addendum)
Spoke with pt's wife, [name] (on dpr), asking for update on pt.  Says pt's confusion is not any better, possibly worse and he gets aggitated when she brings some of it to his attention.  Pt  has f/u with surgeon on Mon, 07/31/20 and some other appts.  She will call back to schedule OV with Dr. Darnell Level.

## 2020-07-31 ENCOUNTER — Other Ambulatory Visit: Payer: Self-pay | Admitting: Thoracic Surgery (Cardiothoracic Vascular Surgery)

## 2020-07-31 ENCOUNTER — Ambulatory Visit
Admission: RE | Admit: 2020-07-31 | Discharge: 2020-07-31 | Disposition: A | Discharge status: Home / Self Care | Payer: PPO | Source: Ambulatory Visit | Attending: Thoracic Surgery (Cardiothoracic Vascular Surgery) | Admitting: Thoracic Surgery (Cardiothoracic Vascular Surgery)

## 2020-07-31 ENCOUNTER — Ambulatory Visit (INDEPENDENT_AMBULATORY_CARE_PROVIDER_SITE_OTHER): Payer: Self-pay | Admitting: Physician Assistant

## 2020-07-31 ENCOUNTER — Other Ambulatory Visit: Payer: Self-pay

## 2020-07-31 ENCOUNTER — Ambulatory Visit (INDEPENDENT_AMBULATORY_CARE_PROVIDER_SITE_OTHER): Payer: PPO | Admitting: Pharmacist Clinician (PhC)/ Clinical Pharmacy Specialist

## 2020-07-31 VITALS — BP 117/79 | HR 97 | Temp 98.1°F | Resp 20 | Wt 214.4 lb

## 2020-07-31 DIAGNOSIS — Z7901 Long term (current) use of anticoagulants: Secondary | ICD-10-CM | POA: Diagnosis not present

## 2020-07-31 DIAGNOSIS — Z9889 Other specified postprocedural states: Secondary | ICD-10-CM

## 2020-07-31 DIAGNOSIS — J90 Pleural effusion, not elsewhere classified: Secondary | ICD-10-CM | POA: Diagnosis not present

## 2020-07-31 DIAGNOSIS — I341 Nonrheumatic mitral (valve) prolapse: Secondary | ICD-10-CM

## 2020-07-31 LAB — POCT INR: INR: 1.8 — AB (ref 2.0–3.0)

## 2020-07-31 MED ORDER — OXYCODONE HCL 5 MG PO TABS: 5.0000 mg | ORAL_TABLET | ORAL | 0 refills | Status: DC | PRN

## 2020-07-31 NOTE — Patient Instructions (Addendum)
Endocarditis is a potentially serious infection of heart valves or inside lining of the heart.  It occurs more commonly in patients with diseased heart valves (such as patient's with aortic or mitral valve disease) and in patients who have undergone heart valve repair or replacement.  Certain surgical and dental procedures may put you at risk, such as dental cleaning, other dental procedures, or any surgery involving the respiratory, urinary, gastrointestinal tract, gallbladder or prostate gland.   To minimize your chances for develooping endocarditis, maintain good oral health and seek prompt medical attention for any infections involving the mouth, teeth, gums, skin or urinary tract.    Always notify your doctor or dentist about your underlying heart valve condition before having any invasive procedures. You will need to take antibiotics before certain procedures, including all routine dental cleanings or other dental procedures.  Your cardiologist or dentist should prescribe these antibiotics for you to be taken ahead of time.  You may return to driving an automobile in 2 weeks, as long as you are no longer requiring oral narcotic pain relievers during the daytime.  It would be wise to start driving only short distances during the daylight and gradually increase from there as you feel comfortable.   Make every effort to stay physically active, get some type of exercise on a regular basis, and stick to a "heart healthy diet".  The long term benefits for regular exercise and a healthy diet are critically important to your overall health and wellbeing.  You may continue to gradually increase your physical activity as tolerated.  Refrain from any heavy lifting or strenuous use of your arms and shoulders until at least 8 weeks from the time of your surgery, and avoid activities that cause increased pain in your chest on the side of your surgical incision.  Otherwise you may continue to increase activities  without any particular limitations.  Increase the intensity and duration of physical activity gradually.

## 2020-07-31 NOTE — Progress Notes (Signed)
HPI: Patient returns for routine postoperative follow-up having undergone Minimally Invasive MV Repair on 07/18/2020. The patient's early postoperative recovery while in the hospital was notable for episodes of agitation and confusion.  Since hospital discharge the patient reports he is doing okay. He is accompanied by his wife today.  He states that he is not sleeping much due to pain he is experiencing.  He has quit taking pain medication as it was not working.  He would like something different to try and get some relief with his discomfort.  His wife states the patient has been sleeping throughout the day.  She also states that his confusion and agitation hasn't improved much since he left the hospital.  He is also not walking much other than getting up and going to the bathroom.  The patient's wife states his appetite is back to normal however she states that he isn't eating very well in regards to food choices.  He has done cardiac rehabilitation in the past and is possibly interested in attending again.  He also has a broken tooth that he would like to get fixed.  He was instructed to wait at least 6 weeks from his surgery and he was instructed on Endocarditis prophylaxis.     Current Outpatient Medications  Medication Sig Dispense Refill  . aspirin EC 81 MG tablet Take 81 mg by mouth every evening. Swallow whole.    Marland Kitchen atorvastatin (LIPITOR) 80 MG tablet Take 1 tablet (80 mg total) by mouth daily. (Patient taking differently: Take 80 mg by mouth every evening.) 90 tablet 3  . carvedilol (COREG) 12.5 MG tablet Take 1 tablet (12.5 mg total) by mouth 2 (two) times daily. 180 tablet 3  . Cholecalciferol (VITAMIN D3) 1000 units CAPS Take 1 capsule (1,000 Units total) by mouth daily. (Patient taking differently: Take 1,000 Units by mouth every evening.) 30 capsule   . glucose blood test strip Use to test sugar daily AS NEEDED 30 each 3  . oxyCODONE (OXY IR/ROXICODONE) 5 MG immediate release tablet Take 1  tablet (5 mg total) by mouth every 4 (four) hours as needed for severe pain. 30 tablet 0  . vitamin B-12 (CYANOCOBALAMIN) 1000 MCG tablet Take 1,000 mcg by mouth every evening.    . warfarin (COUMADIN) 5 MG tablet Take 1 tablet (5 mg total) by mouth daily at 4 PM. 30 tablet 1  . nitroGLYCERIN (NITROSTAT) 0.4 MG SL tablet Place 1 tablet (0.4 mg total) under the tongue every 5 (five) minutes as needed for chest pain. 25 tablet 11  . traMADol (ULTRAM) 50 MG tablet Take 1 tablet (50 mg total) by mouth every 6 (six) hours as needed for moderate pain. (Patient not taking: Reported on 07/31/2020) 30 tablet 0   No current facility-administered medications for this visit.    Physical Exam:  BP 117/79 (BP Location: Left Arm, Patient Position: Sitting, Cuff Size: Normal)   Pulse 97   Temp 98.1 F (36.7 C) (Skin)   Resp 20   Wt 214 lb 6.4 oz (97.3 kg)   SpO2 95% Comment: RA  BMI 30.76 kg/m   Gen: no apparent distress Heart: RRR Lungs: CTA bilaterally Abd: soft non-tender, non-distended Ext: no edema Incisions: C/D/I, posterior chest tube site is open, no evidence of infection  Diagnostic Tests:  CXR: small right pleural effusions/atelectasis   1. S/P Minimally Invasive MVR- overall doing okay, remains on Coumadin, Coreg 2. Ambulation- patient is not up and walking, he was educated on the importance  of ambulating at least 3x per day.  A referral was placed for cardiac rehabilitation 3. Insomnia/Pain- patient was instructed to not sleep during the day.  He was told he should go to sleep only at night, until his rest cycle improves.  He was given a prescription for Oxycodone 5 mg, 1 tablet every 4 hours as needed for pain... Hopefully this will control his pain 4. Dispo- patient stable, I think if he increases activity level and can get his sleep cycle back to normal he will continue to make good progress.  He was again educated on the importance of ambulating at least 3 times per day.  Referral  to cardiac rehab placed, further recovery instructions provided.  RTC In 3 months with Dr. Geraldo Docker, PA-C [facility] [phone]
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
## 2020-08-07 ENCOUNTER — Ambulatory Visit: Payer: PPO | Admitting: Medical

## 2020-08-07 ENCOUNTER — Encounter: Payer: Self-pay | Admitting: Medical

## 2020-08-07 ENCOUNTER — Other Ambulatory Visit: Payer: Self-pay

## 2020-08-07 ENCOUNTER — Ambulatory Visit (INDEPENDENT_AMBULATORY_CARE_PROVIDER_SITE_OTHER): Payer: PPO

## 2020-08-07 VITALS — BP 140/80 | HR 96 | Ht 70.0 in | Wt 213.0 lb

## 2020-08-07 DIAGNOSIS — Z7901 Long term (current) use of anticoagulants: Secondary | ICD-10-CM

## 2020-08-07 DIAGNOSIS — I340 Nonrheumatic mitral (valve) insufficiency: Secondary | ICD-10-CM | POA: Diagnosis not present

## 2020-08-07 DIAGNOSIS — I2510 Atherosclerotic heart disease of native coronary artery without angina pectoris: Secondary | ICD-10-CM

## 2020-08-07 DIAGNOSIS — I10 Essential (primary) hypertension: Secondary | ICD-10-CM

## 2020-08-07 DIAGNOSIS — Z9889 Other specified postprocedural states: Secondary | ICD-10-CM | POA: Diagnosis not present

## 2020-08-07 DIAGNOSIS — E7800 Pure hypercholesterolemia, unspecified: Secondary | ICD-10-CM | POA: Diagnosis not present

## 2020-08-07 LAB — POCT INR: INR: 1.3 — AB (ref 2.0–3.0)

## 2020-08-07 MED ORDER — WARFARIN SODIUM 5 MG PO TABS: ORAL_TABLET | ORAL | 2 refills | Status: DC

## 2020-08-07 MED ORDER — AMOXICILLIN 500 MG PO TABS: ORAL_TABLET | ORAL | 0 refills | Status: DC

## 2020-08-07 NOTE — Patient Instructions (Addendum)
Medication Instructions:   TAKE AMOXACILLIN 47 MINS-1 HOUR PRIOR TO DENTAL PROCEDURE  Your physician recommends that you continue on your current medications as directed. Please refer to the Current Medication list given to you today.  *If you need a refill on your cardiac medications before your next appointment, please call your pharmacy*   Lab Work: Your physician recommends that you return for a FASTING lipid profile:   DO NOT EAT OR DRINK PAST MIDNIGHT. OKAY TO HAVE WATER. If you have labs (blood work) drawn today and your tests are completely normal, you will receive your results only by: Marland Kitchen MyChart Message (if you have MyChart) OR . A paper copy in the mail If you have any lab test that is abnormal or we need to change your treatment, we will call you to review the results.   Testing/Procedures: NONE ordered at this time of appointment   Follow-Up: At [facility], you and your health needs are our priority.  As part of our continuing mission to provide you with exceptional heart care, we have created designated Provider Care Teams.  These Care Teams include your primary Cardiologist (physician) and Advanced Practice Providers (APPs -  Physician Assistants and Nurse Practitioners) who all work together to provide you with the care you need, when you need it.  Your next appointment:   3 month(s)  The format for your next appointment:   In Person  Provider:   Peter [name], MD  Other Instructions

## 2020-08-07 NOTE — Patient Instructions (Signed)
Take 3 tablets tonight only and then increase to 1.5 tablets daily except 1 tablets each Monday, Wednesday and Friday.  Repeat INR in 1 week.   Call if any signs of abnormal bleeding.  [phone]

## 2020-08-14 ENCOUNTER — Ambulatory Visit (INDEPENDENT_AMBULATORY_CARE_PROVIDER_SITE_OTHER): Payer: PPO

## 2020-08-14 ENCOUNTER — Other Ambulatory Visit: Payer: Self-pay | Admitting: Physician Assistant

## 2020-08-14 ENCOUNTER — Other Ambulatory Visit: Payer: Self-pay

## 2020-08-14 DIAGNOSIS — E7800 Pure hypercholesterolemia, unspecified: Secondary | ICD-10-CM | POA: Diagnosis not present

## 2020-08-14 DIAGNOSIS — Z7901 Long term (current) use of anticoagulants: Secondary | ICD-10-CM | POA: Diagnosis not present

## 2020-08-14 DIAGNOSIS — Z9889 Other specified postprocedural states: Secondary | ICD-10-CM

## 2020-08-14 LAB — POCT INR: INR: 2 (ref 2.0–3.0)

## 2020-08-14 NOTE — Patient Instructions (Signed)
Continue taking 1.5 tablets daily except 1 tablet each Monday, Wednesday and Friday.  Repeat INR in 4 week.   Call if any signs of abnormal bleeding.  [phone]

## 2020-08-15 LAB — LIPID PANEL
Chol/HDL Ratio: 3.2 ratio (ref 0.0–5.0)
Cholesterol, Total: 131 mg/dL (ref 100–199)
HDL: 41 mg/dL (ref >39)
LDL Chol Calc (NIH): 74 mg/dL (ref 0–99)
Triglycerides: 83 mg/dL (ref 0–149)
VLDL Cholesterol Cal: 16 mg/dL (ref 5–40)

## 2020-08-18 ENCOUNTER — Other Ambulatory Visit (HOSPITAL_COMMUNITY): Payer: PPO

## 2020-08-18 ENCOUNTER — Other Ambulatory Visit: Payer: Self-pay

## 2020-08-18 DIAGNOSIS — E785 Hyperlipidemia, unspecified: Secondary | ICD-10-CM

## 2020-08-18 DIAGNOSIS — E7800 Pure hypercholesterolemia, unspecified: Secondary | ICD-10-CM

## 2020-08-18 MED ORDER — EZETIMIBE 10 MG PO TABS: 10.0000 mg | ORAL_TABLET | Freq: Every day | ORAL | 3 refills | Status: DC

## 2020-09-01 ENCOUNTER — Other Ambulatory Visit: Payer: Self-pay

## 2020-09-01 ENCOUNTER — Ambulatory Visit (HOSPITAL_COMMUNITY): Payer: PPO | Attending: Internal Medicine

## 2020-09-01 DIAGNOSIS — Z9889 Other specified postprocedural states: Secondary | ICD-10-CM | POA: Diagnosis not present

## 2020-09-01 DIAGNOSIS — I341 Nonrheumatic mitral (valve) prolapse: Secondary | ICD-10-CM | POA: Diagnosis not present

## 2020-09-01 LAB — ECHOCARDIOGRAM COMPLETE
Area-P 1/2: 3.51 cm2
MV VTI: 1.43 cm2
S' Lateral: 3.3 cm

## 2020-09-11 ENCOUNTER — Other Ambulatory Visit: Payer: Self-pay

## 2020-09-11 ENCOUNTER — Ambulatory Visit (INDEPENDENT_AMBULATORY_CARE_PROVIDER_SITE_OTHER): Payer: PPO

## 2020-09-11 DIAGNOSIS — Z9889 Other specified postprocedural states: Secondary | ICD-10-CM | POA: Diagnosis not present

## 2020-09-11 DIAGNOSIS — Z7901 Long term (current) use of anticoagulants: Secondary | ICD-10-CM

## 2020-09-11 LAB — POCT INR: INR: 4.2 — AB (ref 2.0–3.0)

## 2020-09-11 NOTE — Patient Instructions (Signed)
Hold tonight only and then Continue taking 1.5 tablets daily except 1 tablet each Monday, Wednesday and Friday.  Repeat INR in 2 weeks.   Call if any signs of abnormal bleeding.  [phone]
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
## 2020-09-25 ENCOUNTER — Other Ambulatory Visit: Payer: Self-pay

## 2020-09-25 ENCOUNTER — Ambulatory Visit: Payer: PPO | Admitting: Cardiology

## 2020-09-25 ENCOUNTER — Ambulatory Visit (INDEPENDENT_AMBULATORY_CARE_PROVIDER_SITE_OTHER): Payer: PPO

## 2020-09-25 ENCOUNTER — Other Ambulatory Visit: Payer: Self-pay | Admitting: Cardiology

## 2020-09-25 ENCOUNTER — Encounter: Payer: Self-pay | Admitting: Cardiology

## 2020-09-25 VITALS — BP 120/86 | HR 93 | Ht 70.0 in | Wt 210.2 lb

## 2020-09-25 DIAGNOSIS — I10 Essential (primary) hypertension: Secondary | ICD-10-CM | POA: Diagnosis not present

## 2020-09-25 DIAGNOSIS — I4819 Other persistent atrial fibrillation: Secondary | ICD-10-CM | POA: Diagnosis not present

## 2020-09-25 DIAGNOSIS — Z9889 Other specified postprocedural states: Secondary | ICD-10-CM

## 2020-09-25 DIAGNOSIS — I341 Nonrheumatic mitral (valve) prolapse: Secondary | ICD-10-CM | POA: Diagnosis not present

## 2020-09-25 DIAGNOSIS — I340 Nonrheumatic mitral (valve) insufficiency: Secondary | ICD-10-CM

## 2020-09-25 DIAGNOSIS — Z7901 Long term (current) use of anticoagulants: Secondary | ICD-10-CM

## 2020-09-25 LAB — POCT INR: INR: 2.8 (ref 2.0–3.0)

## 2020-09-25 NOTE — Patient Instructions (Signed)
Continue taking 1.5 tablets daily except 1 tablet each Monday, Wednesday and Friday.  Repeat INR in 4 weeks.   Call if any signs of abnormal bleeding.  [phone]

## 2020-09-25 NOTE — Addendum Note (Signed)
Addended by: Kathyrn Lass on: 09/25/2020 11:49 AM   Modules accepted: Orders

## 2020-09-25 NOTE — Patient Instructions (Signed)
Medication Instructions:  Continue same medications *If you need a refill on your cardiac medications before your next appointment, please call your pharmacy*   Lab Work: Bmet,cbc today  Covid test Friday 5/20 at 10:50 am at [place] drive thru site  [place] until after cardioversion   Testing/Procedures: Cardioversion   Follow instructions below   Follow-Up: At [facility], you and your health needs are our priority.  As part of our continuing mission to provide you with exceptional heart care, we have created designated Provider Care Teams.  These Care Teams include your primary Cardiologist (physician) and Advanced Practice Providers (APPs -  Physician Assistants and Nurse Practitioners) who all work together to provide you with the care you need, when you need it.  We recommend signing up for the patient portal called "MyChart".  Sign up information is provided on this After Visit Summary.  MyChart is used to connect with patients for Virtual Visits (Telemedicine).  Patients are able to view lab/test results, encounter notes, upcoming appointments, etc.  Non-urgent messages can be sent to your provider as well.   To learn more about what you can do with MyChart, go to [email or web address].    Your next appointment:  Wed 10/18/20 at 10:45 am   The format for your next appointment: Office    Provider: [name]       You are scheduled for a  Cardioversion on Monday 10/02/20 with Dr.Crenshaw.  Please arrive at the [facility] (Main Entrance A) at [facility]: [address] [place], [place] [address] at 7:00 am.   DIET: Nothing to eat or drink after midnight except a sip of water with medications (see medication instructions below)  Medication Instructions:   Continue your anticoagulant: Coumadin You will need to continue your anticoagulant after your procedure until you  are told by your  Provider that it is safe to stop   Labs: Bmet,cbc  today  Covid test Friday 09/29/20 at 10:50 am at [place] until after [place] must have a responsible person to drive you home and stay in the waiting area during your procedure. Failure to do so could result in cancellation.  Bring your insurance cards.  *Special Note: Every effort is made to have your procedure done on time. Occasionally there are emergencies that occur at the hospital that may cause delays. Please be patient if a delay does occur.

## 2020-09-26 LAB — CBC WITH DIFFERENTIAL/PLATELET
Basophils Absolute: 0.1 10*3/uL (ref 0.0–0.2)
Basos: 1 %
EOS (ABSOLUTE): 0.1 10*3/uL (ref 0.0–0.4)
Eos: 1 %
Hematocrit: 40.2 % (ref 37.5–51.0)
Hemoglobin: 13.6 g/dL (ref 13.0–17.7)
Immature Grans (Abs): 0 10*3/uL (ref 0.0–0.1)
Immature Granulocytes: 0 %
Lymphocytes Absolute: 1.4 10*3/uL (ref 0.7–3.1)
Lymphs: 16 %
MCH: 33 pg (ref 26.6–33.0)
MCHC: 33.8 g/dL (ref 31.5–35.7)
MCV: 98 fL — ABNORMAL HIGH (ref 79–97)
Monocytes Absolute: 0.7 10*3/uL (ref 0.1–0.9)
Monocytes: 8 %
Neutrophils Absolute: 6.4 10*3/uL (ref 1.4–7.0)
Neutrophils: 74 %
Platelets: 242 10*3/uL (ref 150–450)
RBC: 4.12 x10E6/uL — ABNORMAL LOW (ref 4.14–5.80)
RDW: 14.2 % (ref 11.6–15.4)
WBC: 8.7 10*3/uL (ref 3.4–10.8)

## 2020-09-26 LAB — BASIC METABOLIC PANEL
BUN/Creatinine Ratio: 12 (ref 10–24)
BUN: 20 mg/dL (ref 8–27)
CO2: 21 mmol/L (ref 20–29)
Calcium: 9.3 mg/dL (ref 8.6–10.2)
Chloride: 104 mmol/L (ref 96–106)
Creatinine, Ser: 1.65 mg/dL — ABNORMAL HIGH (ref 0.76–1.27)
Glucose: 106 mg/dL — ABNORMAL HIGH (ref 65–99)
Potassium: 4.2 mmol/L (ref 3.5–5.2)
Sodium: 143 mmol/L (ref 134–144)
eGFR: 43 mL/min/{1.73_m2} — ABNORMAL LOW (ref >59)

## 2020-09-29 ENCOUNTER — Other Ambulatory Visit (HOSPITAL_COMMUNITY): Payer: PPO

## 2020-10-02 ENCOUNTER — Encounter (HOSPITAL_COMMUNITY)
Admission: RE | Disposition: A | Discharge status: Home / Self Care | Payer: Self-pay | Source: Home / Self Care | Attending: Cardiology

## 2020-10-02 ENCOUNTER — Ambulatory Visit (HOSPITAL_COMMUNITY)
Admission: RE | Admit: 2020-10-02 | Discharge: 2020-10-02 | Disposition: A | Discharge status: Home / Self Care | Payer: PPO | Attending: Cardiology | Admitting: Cardiology

## 2020-10-02 ENCOUNTER — Ambulatory Visit (HOSPITAL_COMMUNITY): Payer: PPO

## 2020-10-02 ENCOUNTER — Encounter (HOSPITAL_COMMUNITY): Payer: Self-pay | Admitting: Cardiology

## 2020-10-02 DIAGNOSIS — E669 Obesity, unspecified: Secondary | ICD-10-CM | POA: Insufficient documentation

## 2020-10-02 DIAGNOSIS — Z6830 Body mass index (BMI) 30.0-30.9, adult: Secondary | ICD-10-CM | POA: Insufficient documentation

## 2020-10-02 DIAGNOSIS — E559 Vitamin D deficiency, unspecified: Secondary | ICD-10-CM | POA: Diagnosis not present

## 2020-10-02 DIAGNOSIS — I2510 Atherosclerotic heart disease of native coronary artery without angina pectoris: Secondary | ICD-10-CM | POA: Insufficient documentation

## 2020-10-02 DIAGNOSIS — E854 Organ-limited amyloidosis: Secondary | ICD-10-CM | POA: Diagnosis not present

## 2020-10-02 DIAGNOSIS — I341 Nonrheumatic mitral (valve) prolapse: Secondary | ICD-10-CM | POA: Insufficient documentation

## 2020-10-02 DIAGNOSIS — I4819 Other persistent atrial fibrillation: Secondary | ICD-10-CM

## 2020-10-02 DIAGNOSIS — Z87891 Personal history of nicotine dependence: Secondary | ICD-10-CM | POA: Insufficient documentation

## 2020-10-02 DIAGNOSIS — I680 Cerebral amyloid angiopathy: Secondary | ICD-10-CM | POA: Insufficient documentation

## 2020-10-02 DIAGNOSIS — Z79899 Other long term (current) drug therapy: Secondary | ICD-10-CM | POA: Insufficient documentation

## 2020-10-02 DIAGNOSIS — I629 Nontraumatic intracranial hemorrhage, unspecified: Secondary | ICD-10-CM | POA: Diagnosis not present

## 2020-10-02 DIAGNOSIS — Z7982 Long term (current) use of aspirin: Secondary | ICD-10-CM | POA: Diagnosis not present

## 2020-10-02 DIAGNOSIS — I129 Hypertensive chronic kidney disease with stage 1 through stage 4 chronic kidney disease, or unspecified chronic kidney disease: Secondary | ICD-10-CM | POA: Diagnosis not present

## 2020-10-02 DIAGNOSIS — N183 Chronic kidney disease, stage 3 unspecified: Secondary | ICD-10-CM | POA: Insufficient documentation

## 2020-10-02 DIAGNOSIS — Z7901 Long term (current) use of anticoagulants: Secondary | ICD-10-CM | POA: Diagnosis not present

## 2020-10-02 HISTORY — PX: CARDIOVERSION: SHX1299

## 2020-10-02 LAB — PROTIME-INR
INR: 2.3 — ABNORMAL HIGH (ref 0.8–1.2)
Prothrombin Time: 25 seconds — ABNORMAL HIGH (ref 11.4–15.2)

## 2020-10-02 SURGERY — CARDIOVERSION
Anesthesia: General

## 2020-10-02 MED ORDER — SODIUM CHLORIDE 0.9 % IV SOLN: INTRAVENOUS | Status: DC | PRN

## 2020-10-02 MED ORDER — PROPOFOL 10 MG/ML IV BOLUS
INTRAVENOUS | Status: DC | PRN
  Administered 2020-10-02: 50 mg via INTRAVENOUS

## 2020-10-02 MED ORDER — LIDOCAINE 2% (20 MG/ML) 5 ML SYRINGE
INTRAMUSCULAR | Status: DC | PRN
  Administered 2020-10-02: 60 mg via INTRAVENOUS

## 2020-10-02 NOTE — Discharge Instructions (Signed)
Electrical Cardioversion  Electrical cardioversion is the delivery of a jolt of electricity to restore a normal rhythm to the heart. A rhythm that is too fast or is not regular keeps the heart from pumping well. In this procedure, sticky patches or metal paddles are placed on the chest to deliver electricity to the heart from a device.  What can I expect after the procedure?  Your blood pressure, heart rate, breathing rate, and blood oxygen level will be monitored until you leave the hospital or clinic.  Your heart rhythm will be watched to make sure it does not change.  You may have some redness on the skin where the shocks were given.If this occurs, can use hydrocortisone cream or Aloe vera.  Follow these instructions at home:  Do not drive for 24 hours if you were given a sedative during your procedure.  Take over-the-counter and prescription medicines only as told by your health care provider.  Ask your health care provider how to check your pulse. Check it often.  Rest for 48 hours after the procedure or as told by your health care provider.  Avoid or limit your caffeine use as told by your health care provider.  Keep all follow-up visits as told by your health care provider. This is important.  Contact a health care provider if:  You feel like your heart is beating too quickly or your pulse is not regular.  You have a serious muscle cramp that does not go away.  Get help right away if:  You have discomfort in your chest.  You are dizzy or you feel faint.  You have trouble breathing or you are short of breath.  Your speech is slurred.  You have trouble moving an arm or leg on one side of your body.  Your fingers or toes turn cold or blue.  Summary  Electrical cardioversion is the delivery of a jolt of electricity to restore a normal rhythm to the heart.  This procedure may be done right away in an emergency or may be a scheduled procedure if the condition is not  an emergency.  Generally, this is a safe procedure.  After the procedure, check your pulse often as told by your health care provider.  This information is not intended to replace advice given to you by your health care provider. Make sure you discuss any questions you have with your health care provider. Document Revised: 11/30/2018 Document Reviewed: 11/30/2018 [employer] Patient Education  2021 [employer]  

## 2020-10-02 NOTE — Anesthesia Preprocedure Evaluation (Signed)
Anesthesia Evaluation  Patient identified by MRN, date of birth, ID band Patient awake    Reviewed: Allergy & Precautions, H&P , NPO status , Patient's Chart, lab work & pertinent test results  Airway Mallampati: II   Neck ROM: full    Dental   Pulmonary sleep apnea , former smoker,    breath sounds clear to auscultation       Cardiovascular hypertension, + CAD and + Past MI  + dysrhythmias Atrial Fibrillation + Valvular Problems/Murmurs  Rhythm:irregular Rate:Normal  S/p mini MVR   Neuro/Psych PSYCHIATRIC DISORDERS Depression  Neuromuscular disease    GI/Hepatic   Endo/Other    Renal/GU Renal InsufficiencyRenal disease     Musculoskeletal   Abdominal   Peds  Hematology   Anesthesia Other Findings   Reproductive/Obstetrics                             Anesthesia Physical Anesthesia Plan  ASA: III  Anesthesia Plan: General   Post-op Pain Management:    Induction: Intravenous  PONV Risk Score and Plan: 2 and Propofol infusion and Treatment may vary due to age or medical condition  Airway Management Planned: Mask  Additional Equipment:   Intra-op Plan:   Post-operative Plan:   Informed Consent: I have reviewed the patients History and Physical, chart, labs and discussed the procedure including the risks, benefits and alternatives for the proposed anesthesia with the patient or authorized representative who has indicated his/her understanding and acceptance.     Dental advisory given  Plan Discussed with: CRNA, Anesthesiologist and Surgeon  Anesthesia Plan Comments:         Anesthesia Quick Evaluation
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
## 2020-10-02 NOTE — Procedures (Signed)
Electrical Cardioversion Procedure Note Jesse [name] [id number] 08-04-1945  Procedure: Electrical Cardioversion Indications:  Atrial Fibrillation  Procedure Details Consent: Risks of procedure as well as the alternatives and risks of each were explained to the (patient/caregiver).  Consent for procedure obtained. Time Out: Verified patient identification, verified procedure, site/side was marked, verified correct patient position, special equipment/implants available, medications/allergies/relevent history reviewed, required imaging and test results available.  Performed  Patient placed on cardiac monitor, pulse oximetry, supplemental oxygen as necessary.  Sedation given: Pt sedated by anesthesia with lidocaine 60 mg and diprovan 50 mg IV. Pacer pads placed anterior and posterior chest.  Cardioverted 1 time(s).  Cardioverted at [place].  Evaluation Findings: Post procedure EKG shows: NSR Complications: None Patient did tolerate procedure well.   Kirk Ruths 10/02/2020, 7:20 AM

## 2020-10-02 NOTE — Transfer of Care (Signed)
Immediate Anesthesia Transfer of Care Note  Patient: Jesse [name]  Procedure(s) Performed: CARDIOVERSION (N/A )  Patient Location: Endoscopy Unit  Anesthesia Type:General  Level of Consciousness: awake and drowsy  Airway & Oxygen Therapy: Patient Spontanous Breathing  Post-op Assessment: Report given to RN and Post -op Vital signs reviewed and stable  Post vital signs: Reviewed and stable  Last Vitals:  Vitals Value Taken Time  BP    Temp    Pulse    Resp    SpO2      Last Pain:  Vitals:   10/02/20 0730  TempSrc: Temporal  PainSc: 0-No pain         Complications: No complications documented.

## 2020-10-03 ENCOUNTER — Encounter (HOSPITAL_COMMUNITY): Payer: Self-pay | Admitting: Cardiology

## 2020-10-03 NOTE — Anesthesia Postprocedure Evaluation (Signed)
Anesthesia Post Note  Patient: Jesse [name]  Procedure(s) Performed: CARDIOVERSION (N/A )     Patient location during evaluation: Endoscopy Anesthesia Type: General Level of consciousness: awake and alert Pain management: pain level controlled Vital Signs Assessment: post-procedure vital signs reviewed and stable Respiratory status: spontaneous breathing, nonlabored ventilation, respiratory function stable and patient connected to nasal cannula oxygen Cardiovascular status: blood pressure returned to baseline and stable Postop Assessment: no apparent nausea or vomiting Anesthetic complications: no   No complications documented.  Last Vitals:  Vitals:   10/02/20 0910 10/02/20 0918  BP: (!) 161/105 (!) 162/122  Pulse: 78 82  Resp: 18 17  Temp:    SpO2: 98% 99%    Last Pain:  Vitals:   10/02/20 0918  TempSrc:   PainSc: 0-No pain                 Keithon Mccoin S

## 2020-10-04 NOTE — Telephone Encounter (Signed)
Duplicate encounter to [facility] provider  Another RN has already responded to patient
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
## 2020-10-18 ENCOUNTER — Ambulatory Visit (INDEPENDENT_AMBULATORY_CARE_PROVIDER_SITE_OTHER): Payer: PPO

## 2020-10-18 ENCOUNTER — Telehealth: Payer: Self-pay

## 2020-10-18 ENCOUNTER — Other Ambulatory Visit: Payer: Self-pay

## 2020-10-18 ENCOUNTER — Encounter: Payer: Self-pay | Admitting: General Practice

## 2020-10-18 ENCOUNTER — Ambulatory Visit: Payer: PPO | Admitting: General Practice

## 2020-10-18 VITALS — BP 144/86 | HR 81 | Ht 70.0 in | Wt 209.8 lb

## 2020-10-18 DIAGNOSIS — Z7901 Long term (current) use of anticoagulants: Secondary | ICD-10-CM

## 2020-10-18 DIAGNOSIS — I10 Essential (primary) hypertension: Secondary | ICD-10-CM

## 2020-10-18 DIAGNOSIS — I341 Nonrheumatic mitral (valve) prolapse: Secondary | ICD-10-CM | POA: Diagnosis not present

## 2020-10-18 DIAGNOSIS — I2510 Atherosclerotic heart disease of native coronary artery without angina pectoris: Secondary | ICD-10-CM

## 2020-10-18 DIAGNOSIS — I4819 Other persistent atrial fibrillation: Secondary | ICD-10-CM | POA: Diagnosis not present

## 2020-10-18 DIAGNOSIS — Z9889 Other specified postprocedural states: Secondary | ICD-10-CM | POA: Diagnosis not present

## 2020-10-18 DIAGNOSIS — E854 Organ-limited amyloidosis: Secondary | ICD-10-CM

## 2020-10-18 DIAGNOSIS — I680 Cerebral amyloid angiopathy: Secondary | ICD-10-CM | POA: Diagnosis not present

## 2020-10-18 LAB — POCT INR: INR: 1 — AB (ref 2.0–3.0)

## 2020-10-18 NOTE — Patient Instructions (Signed)
Medication Instructions:  The current medical regimen is effective;  continue present plan and medications as directed. Please refer to the Current Medication list given to you today.  *If you need a refill on your cardiac medications before your next appointment, please call your pharmacy*  Lab Work:   Testing/Procedures:  NONE    NONE  Special Instructions WALK 15 MINUTES A DAY  PLEASE READ AND FOLLOW SALTY 6-ATTACHED-1,800mg  daily  CONTINUE ANTIBIOTICS BEFORE DENTAL VISIT  Follow-Up: Your next appointment:  4 month(s) In Person with Peter [name], MD OR IF UNAVAILABLE [place], FNP-C   At [facility], you and your health needs are our priority.  As part of our continuing mission to provide you with exceptional heart care, we have created designated Provider Care Teams.  These Care Teams include your primary Cardiologist (physician) and Advanced Practice Providers (APPs -  Physician Assistants and Nurse Practitioners) who all work together to provide you with the care you need, when you need it.            6 SALTY THINGS TO AVOID     1,800MG  DAILY

## 2020-10-18 NOTE — Telephone Encounter (Signed)
I left patient message that according to Dr Doug Sou note, it was ok to terminate Coumadin therapy 4 weeks from Cardioversion (5/23) if patient maintains SR.  I told them to call, if anything changes, but otherwise we will stop Coumadin 6/20.

## 2020-10-18 NOTE — Patient Instructions (Signed)
Take 2 tablets today only and then Continue taking 1.5 tablets daily except 1 tablet each Monday, Wednesday and Friday.  May complete treatment 6/20, will review.   Call if any signs of abnormal bleeding.  [phone]

## 2020-10-27 ENCOUNTER — Telehealth: Payer: Self-pay

## 2020-10-27 NOTE — Telephone Encounter (Signed)
I spoke to the patient's wife and he will terminate Coumadin on 6/20, per Dr Doug Sou note.  Patient remains in [place].

## 2020-11-01 ENCOUNTER — Other Ambulatory Visit: Payer: Self-pay | Admitting: *Deleted

## 2020-11-02 ENCOUNTER — Encounter (HOSPITAL_COMMUNITY)
Admission: RE | Admit: 2020-11-02 | Discharge: 2020-11-02 | Disposition: A | Discharge status: Home / Self Care | Payer: PPO | Source: Ambulatory Visit | Attending: Thoracic Surgery (Cardiothoracic Vascular Surgery) | Admitting: Thoracic Surgery (Cardiothoracic Vascular Surgery)

## 2020-11-02 ENCOUNTER — Other Ambulatory Visit: Payer: Self-pay

## 2020-11-02 VITALS — BP 128/88 | HR 84 | Ht 70.0 in | Wt 213.2 lb

## 2020-11-02 DIAGNOSIS — Z9889 Other specified postprocedural states: Secondary | ICD-10-CM | POA: Diagnosis not present
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
## 2020-11-03 ENCOUNTER — Telehealth (INDEPENDENT_AMBULATORY_CARE_PROVIDER_SITE_OTHER): Payer: PPO | Admitting: Thoracic Surgery (Cardiothoracic Vascular Surgery)

## 2020-11-03 ENCOUNTER — Encounter: Payer: Self-pay | Admitting: Thoracic Surgery (Cardiothoracic Vascular Surgery)

## 2020-11-03 DIAGNOSIS — Z9889 Other specified postprocedural states: Secondary | ICD-10-CM | POA: Diagnosis not present

## 2020-11-03 NOTE — Progress Notes (Signed)
[address]       [place],Jesse [name] [address]             [phone]     CARDIOTHORACIC SURGERY TELEPHONE VIRTUAL OFFICE NOTE  Primary Cardiologist is Peter [name], MD PCP is Ria Bush, MD   HPI:  I spoke with Jesse [name] (DOB 30-Dec-1945 ) via telephone on 11/03/2020 at 12:08 PM and verified that I was speaking with the correct person using more than one form of identification.  We discussed the fact that I was contacting them from my office and they were located at home, as well as the reason(s) for conducting our visit virtually instead of in-person.  The patient expressed understanding the circumstances and agreed to proceed as described.  [name] with history of coronary artery disease, mitral valve prolapse with mitral regurgitation, hypertension, stage III chronic kidney disease, cerebral amyloid angiopathy, and hyperlipidemia who underwent minimally invasive mitral valve repair on July 18, 2020 for severe symptomatic primary mitral regurgitation.  The patient's early postoperative recovery in the hospital was notable for some confusion and agitation which resolved.  He was last seen in our office on July 31, 2020 at which time he was doing fairly well.  Routine follow-up echocardiogram performed September 01, 2020 revealed normal left ventricular systolic function with intact mitral valve repair.  He was seen in follow-up by Dr. [name] and noted to be in atrial fibrillation in early May.  He subsequently underwent DC cardioversion Oct 02, 2020 and was maintaining sinus rhythm when he was last seen in Dr. Doug Sou office earlier this month.  He has been referred to the cardiac rehab program but has not yet started.  I spoke with the patient over the telephone today.  He reports that overall he is doing well.  He admits that he does not exercise much but he never did prior to surgery either.  He does plan to participate in the cardiac rehab program.   He does not have any residual pain in his chest.  He denies any shortness of breath.  Overall he states that he is doing okay and he has no specific complaints.  He remains anticoagulated using Coumadin.    Current Outpatient Medications  Medication Sig Dispense Refill   amoxicillin (AMOXIL) 500 MG tablet TAKE 2000 MG 30 MINUTES TO 1 HOUR BEFORE DENTAL PROCEDURE (Patient taking differently: Take 2,000 mg by mouth See admin instructions. TAKE 2000 MG 30 MINUTES TO 1 HOUR BEFORE DENTAL PROCEDURE) 4 tablet 0   Ascorbic Acid (VITAMIN C) 1000 MG tablet Take 1,000 mg by mouth daily.     aspirin EC 81 MG tablet Take 81 mg by mouth every evening. Swallow whole.     atorvastatin (LIPITOR) 80 MG tablet Take 1 tablet (80 mg total) by mouth daily. 90 tablet 3   carvedilol (COREG) 12.5 MG tablet Take 1 tablet (12.5 mg total) by mouth 2 (two) times daily. 180 tablet 3   Cholecalciferol (VITAMIN D3) 1000 units CAPS Take 1 capsule (1,000 Units total) by mouth daily. 30 capsule    ezetimibe (ZETIA) 10 MG tablet Take 1 tablet (10 mg total) by mouth daily. (Patient taking differently: Take 10 mg by mouth every evening.) 90 tablet 3   glucose blood test strip Use to test sugar daily AS NEEDED 30 each 3   nitroGLYCERIN (NITROSTAT) 0.4 MG SL tablet Place 1 tablet (0.4 mg total) under the tongue every 5 (five) minutes as  needed for chest pain. 25 tablet 11   vitamin B-12 (CYANOCOBALAMIN) 1000 MCG tablet Take 1,000 mcg by mouth every evening.     warfarin (COUMADIN) 5 MG tablet Take 1-2 tablets daily or as prescribed by [facility] (Patient taking differently: Take 5-7.5 mg by mouth See admin instructions. Take 5 mg in the evening on Mon, Wed , and Fri. Take 7.5 mg in the evening on Sun, Tues, Thurs, and Sat) 60 tablet 2   No current facility-administered medications for this visit.     Diagnostic Tests:     ECHOCARDIOGRAM REPORT         Patient Name:   Jesse [name] Date of Exam: 09/01/2020  Medical Rec  #:  [id number]       Height:       70.0 in  Accession #:    [id number]      Weight:       213.0 lb  Date of Birth:  [birth year]       BSA:          2.144 m  Patient Age:    64 years        BP:           140/80 mmHg  Patient Gender: M               HR:           77 bpm.  Exam Location:  [facility]   Procedure: 2D Echo, Cardiac Doppler and Color Doppler   Indications:    I05.9 Mitral valve disorder; Z98.89 S/P MV repair     History:        Patient has prior history of Echocardiogram examinations,  most                  recent 07/18/2020. CAD, Mitral Valve Prolapse; Risk                  Factors:Hypertension and Dyslipidemia. Prediabetes. Mitral                  regurgitation. Chronic kidney disease. Obstructive sleep  apnea.                  Fatigue.                     Mitral Valve: prosthetic annuloplasty ring valve is  present in                  the mitral position. Procedure Date: 07/18/20.     Sonographer:    Diamond Nickel RCS  Referring Phys: [id number] [name] [name]   IMPRESSIONS     1. Left ventricular ejection fraction, by estimation, is 50 to 55%. The  left ventricle has low normal function. The left ventricle has no regional  wall motion abnormalities. There is moderate concentric left ventricular  hypertrophy. Left ventricular  diastolic parameters are indeterminate.   2. Right ventricular systolic function is normal. The right ventricular  size is normal.   3. The mitral valve has been repaired/replaced. No evidence of mitral  valve regurgitation. The mean mitral valve gradient is 6.0 mmHg. There is  a prosthetic annuloplasty ring (4D MEMO RING- 32 mm) present in the mitral  position. Procedure Date: 07/18/20.   4. The aortic valve is calcified. Aortic valve regurgitation is not  visualized. Aortic valve sclerosis/calcification is present, without  Doppler evidence of aortic stenosis.   5. Aortic dilatation  noted. There is mild dilatation of the aortic root,  measuring  40 mm.   Comparison(s): A prior study was performed on 02/15/2020. Slight decrease  in LVEF and increase in MV gradient post MV annuloplasty.   FINDINGS   Left Ventricle: Left ventricular ejection fraction, by estimation, is 50  to 55%. The left ventricle has low normal function. The left ventricle has  no regional wall motion abnormalities. The left ventricular internal  cavity size was normal in size.  There is moderate concentric left ventricular hypertrophy. Left  ventricular diastolic parameters are indeterminate.   Right Ventricle: The right ventricular size is normal. No increase in  right ventricular wall thickness. Right ventricular systolic function is  normal.   Left Atrium: Left atrial size was normal in size.   Right Atrium: Right atrial size was normal in size.   Pericardium: There is no evidence of pericardial effusion.   Mitral Valve: The mitral valve has been repaired/replaced. No evidence of  mitral valve regurgitation. There is a prosthetic annuloplasty ring  present in the mitral position. Procedure Date: 07/18/20. MV peak gradient,  15.1 mmHg. The mean mitral valve  gradient is 6.0 mmHg with average heart rate of 82 bpm.   Tricuspid Valve: The tricuspid valve is normal in structure. Tricuspid  valve regurgitation is not demonstrated.   Aortic Valve: The aortic valve is calcified. Aortic valve regurgitation is  not visualized. Mild to moderate aortic valve sclerosis/calcification is  present, without any evidence of aortic stenosis.   Pulmonic Valve: The pulmonic valve was grossly normal. Pulmonic valve  regurgitation is not visualized. No evidence of pulmonic stenosis.   Aorta: Aortic dilatation noted. There is mild dilatation of the aortic  root, measuring 40 mm.   IAS/Shunts: The atrial septum is grossly normal.      LEFT VENTRICLE  PLAX 2D  LVIDd:         4.40 cm  LVIDs:         3.30 cm  LV PW:         1.80 cm  LV IVS:        1.30 cm  LVOT diam:      2.20 cm  LV SV:         66  LV SV Index:   31  LVOT Area:     3.80 cm      RIGHT VENTRICLE  RV Basal diam:  2.30 cm  TAPSE (M-mode): 0.2 cm   LEFT ATRIUM             Index       RIGHT ATRIUM           Index  LA diam:        5.00 cm 2.33 cm/m  RA Area:     14.10 cm  LA Vol (A2C):   58.8 ml 27.43 ml/m RA Volume:   32.60 ml  15.21 ml/m  LA Vol (A4C):   56.9 ml 26.54 ml/m  LA Biplane Vol: 60.7 ml 28.31 ml/m   AORTIC VALVE  LVOT Vmax:   76.77 cm/s  LVOT Vmean:  51.300 cm/s  LVOT VTI:    0.173 m     AORTA  Ao Root diam: 4.00 cm   MITRAL VALVE  MV Area (PHT): 3.51 cm     SHUNTS  MV Area VTI:   1.43 cm     Systemic VTI:  0.17 m  MV Peak grad:  15.1 mmHg    Systemic Diam: 2.20  cm  MV Mean grad:  6.0 mmHg  MV Vmax:       1.94 m/s  MV Vmean:      112.0 cm/s  MV Decel Time: 216 msec  MV E velocity: 159.00 cm/s  MV A velocity: 67.90 cm/s  MV E/A ratio:  2.34   Rudean Haskell MD  Electronically signed by Rudean Haskell MD  Signature Date/Time: 09/01/2020/1:08:00 PM       Impression:  Patient is doing well more than 3 months status post mitral valve repair  Plan:  We have not recommended any change the patient's current medications.  I have encouraged the patient to continue to gradually increase his physical activity without any particular limitations.  I have specifically discussed the potential benefits of participation in the cardiac rehab program.  The patient has been reminded regarding the importance of dental hygiene and the lifelong need for antibiotic prophylaxis for all dental cleanings and other related invasive procedures.  The patient will continue to follow-up regularly with Dr. [name] and call this office in the future only should specific problems or questions arise.    I discussed limitations of evaluation and management via telephone.  The patient was advised to call back for repeat telephone consultation or to seek an in-person  evaluation if questions arise or the patient's clinical condition changes in any significant manner.  I spent in excess of 10 minutes of non-face-to-face time during the conduct of this telephone virtual office consultation, including pre-visit review of the patient's records and direct conversation with the patient.     Valentina Gu. Roxy Manns, MD 11/03/2020 12:08 PM

## 2020-11-03 NOTE — Patient Instructions (Signed)

## 2020-11-06 ENCOUNTER — Ambulatory Visit: Payer: PPO | Admitting: Thoracic Surgery (Cardiothoracic Vascular Surgery)

## 2020-11-06 ENCOUNTER — Encounter (HOSPITAL_COMMUNITY)
Admission: RE | Admit: 2020-11-06 | Discharge: 2020-11-06 | Disposition: A | Discharge status: Home / Self Care | Payer: PPO | Source: Ambulatory Visit | Attending: Thoracic Surgery (Cardiothoracic Vascular Surgery) | Admitting: Thoracic Surgery (Cardiothoracic Vascular Surgery)

## 2020-11-06 ENCOUNTER — Other Ambulatory Visit: Payer: Self-pay

## 2020-11-06 DIAGNOSIS — Z9889 Other specified postprocedural states: Secondary | ICD-10-CM | POA: Diagnosis not present

## 2020-11-06 NOTE — Progress Notes (Signed)
Daily Session Note  Patient Details  Name: Jesse [name] MRN: [id number] Date of Birth: [birth year] Referring Provider:   Flowsheet Row CARDIAC REHAB PHASE II ORIENTATION from 11/02/2020 in [place]  Referring Provider Dr. Roxy Manns       Encounter Date: 11/06/2020  Check In:  Session Check In - 11/06/20 0930       Check-In   Supervising physician immediately available to respond to emergencies [facility] MD immediately available    Physician(s) Dr. Domenic Polite    Location [facility]    Staff Present Aundra Dubin, RN, Bjorn Loser, MS, ACSM-CEP, Exercise Physiologist    Virtual Visit No    Fall or balance concerns reported    Yes    Comments Pt has only fallen once in the past year, but he reports that he gets off balance easily.    Tobacco Cessation No Change    Warm-up and Cool-down Performed as group-led instruction    Resistance Training Performed Yes    VAD Patient? No    PAD/SET Patient? No      Pain Assessment   Currently in Pain? No/denies    Multiple Pain Sites No             Capillary Blood Glucose: No results found for this or any previous visit (from the past 24 hour(s)).    Social History   Tobacco Use  Smoking Status Former   Packs/day: 1.50   Years: 15.00   Pack years: 22.50   Types: Cigarettes   Quit date: 05/13/1978   Years since quitting: 42.5  Smokeless Tobacco Never  Tobacco Comments   quit in the early 80's    Goals Met:  Independence with exercise equipment Exercise tolerated well No report of cardiac concerns or symptoms Strength training completed today  Goals Unmet:  Not Applicable  Comments: Check out 1030.   Dr. Kathie Dike is Medical Director for [facility] Pulmonary Re[facility].

## 2020-11-07 ENCOUNTER — Telehealth: Payer: PPO | Admitting: Thoracic Surgery (Cardiothoracic Vascular Surgery)

## 2020-11-08 ENCOUNTER — Other Ambulatory Visit: Payer: Self-pay

## 2020-11-08 ENCOUNTER — Telehealth: Payer: Self-pay | Admitting: Cardiology

## 2020-11-08 ENCOUNTER — Encounter (HOSPITAL_COMMUNITY)
Admission: RE | Admit: 2020-11-08 | Discharge: 2020-11-08 | Disposition: A | Discharge status: Home / Self Care | Payer: PPO | Source: Ambulatory Visit | Attending: Thoracic Surgery (Cardiothoracic Vascular Surgery) | Admitting: Thoracic Surgery (Cardiothoracic Vascular Surgery)

## 2020-11-08 DIAGNOSIS — Z9889 Other specified postprocedural states: Secondary | ICD-10-CM

## 2020-11-08 NOTE — Progress Notes (Signed)
Daily Session Note  Patient Details  Name: Jesse [name] MRN: [id number] Date of Birth: 07-12-1945 Referring Provider:   Flowsheet Row CARDIAC REHAB PHASE II ORIENTATION from 11/02/2020 in [place]  Referring Provider Dr. Roxy Manns       Encounter Date: 11/08/2020  Check In:  Session Check In - 11/08/20 0930       Check-In   Supervising physician immediately available to respond to emergencies [facility] MD immediately available    Physician(s) Dr. Domenic Polite    Staff Present Aundra Dubin, RN, Bjorn Loser, MS, ACSM-CEP, Exercise Physiologist    Virtual Visit No    Medication changes reported     No    Fall or balance concerns reported    Yes    Comments Pt has only fallen once in the past year, but he reports that he gets off balance easily.    Tobacco Cessation No Change    Warm-up and Cool-down Performed as group-led instruction    Resistance Training Performed Yes    VAD Patient? No    PAD/SET Patient? No      Pain Assessment   Currently in Pain? No/denies    Multiple Pain Sites No             Capillary Blood Glucose: No results found for this or any previous visit (from the past 24 hour(s)).    Social History   Tobacco Use  Smoking Status Former   Packs/day: 1.50   Years: 15.00   Pack years: 22.50   Types: Cigarettes   Quit date: 05/13/1978   Years since quitting: 42.5  Smokeless Tobacco Never  Tobacco Comments   quit in the early 80's    Goals Met:  Independence with exercise equipment Exercise tolerated well No report of cardiac concerns or symptoms Strength training completed today  Goals Unmet:  Not Applicable  Comments: Check out 1030.   Dr. Kathie Dike is Medical Director for [facility] Pulmonary Rehab.

## 2020-11-08 NOTE — Telephone Encounter (Signed)
Spoke to patient, patient reports he was on amlodipine for 8-9 years and his wife manages his medication.  He recently noticed he was not on this anymore.   He was unsure why this was stopped.    Advised after reviewing chart-this was discontinued at discharge from hospital in march.  Unable to locate reason.  Advised would continue to look and if unable to find would send to Dr. [name] to review.   Continued to review chart and noted the following:  -History of hypertension- on amlodipine and carvedilol prior to admission. Avoiding B-blockers since he has been bradycardic. BP control currently adequate off of his medications. Continue to monitor.      Attempted to call patient back to make aware-left detailed message to make aware. Advised to call back with any additional questions.

## 2020-11-08 NOTE — Telephone Encounter (Signed)
Ronalee Belts is calling wanting to speak with Malachy Mood in regards to a medication. Did not specify any further. Please advise.

## 2020-11-10 ENCOUNTER — Encounter (HOSPITAL_COMMUNITY)
Admission: RE | Admit: 2020-11-10 | Discharge: 2020-11-10 | Disposition: A | Discharge status: Home / Self Care | Payer: PPO | Source: Ambulatory Visit | Attending: Thoracic Surgery (Cardiothoracic Vascular Surgery) | Admitting: Thoracic Surgery (Cardiothoracic Vascular Surgery)

## 2020-11-10 ENCOUNTER — Other Ambulatory Visit: Payer: Self-pay

## 2020-11-10 DIAGNOSIS — Z9889 Other specified postprocedural states: Secondary | ICD-10-CM | POA: Diagnosis not present

## 2020-11-10 NOTE — Progress Notes (Signed)
Daily Session Note  Patient Details  Name: TEANDRE HAMRE MRN: [id number] Date of Birth: 1945-05-26 Referring Provider:   Flowsheet Row CARDIAC REHAB PHASE II ORIENTATION from 11/02/2020 in [place]  Referring Provider Dr. Roxy Manns       Encounter Date: 11/10/2020  Check In:  Session Check In - 11/10/20 0930       Check-In   Supervising physician immediately available to respond to emergencies [facility] MD immediately available    Physician(s) Dr. Domenic Polite    Location [facility]    Staff Present Geanie Cooley, RN;Dalton Fletcher, MS, ACSM-CEP, Exercise Physiologist    Virtual Visit No    Medication changes reported     No    Fall or balance concerns reported    Yes    Comments Pt has only fallen once in the past year, but he reports that he gets off balance easily.    Tobacco Cessation No Change    Warm-up and Cool-down Performed as group-led instruction    Resistance Training Performed Yes    VAD Patient? No    PAD/SET Patient? No      Pain Assessment   Currently in Pain? No/denies    Multiple Pain Sites No             Capillary Blood Glucose: No results found for this or any previous visit (from the past 24 hour(s)).    Social History   Tobacco Use  Smoking Status Former   Packs/day: 1.50   Years: 15.00   Pack years: 22.50   Types: Cigarettes   Quit date: 05/13/1978   Years since quitting: 42.5  Smokeless Tobacco Never  Tobacco Comments   quit in the early 80's    Goals Met:  Independence with exercise equipment Exercise tolerated well No report of cardiac concerns or symptoms Strength training completed today  Goals Unmet:  Not Applicable  Comments: check out @ 10:30   Dr. Kathie Dike is Medical Director for [facility] Pulmonary Re[facility].

## 2020-11-10 NOTE — Telephone Encounter (Signed)
Spoke to patient Dr.Jordan advised ok to stay off Amlodipine.Advised continue cardiac rehab they will monitor your B/P.

## 2020-11-13 ENCOUNTER — Encounter (HOSPITAL_COMMUNITY): Payer: PPO

## 2020-11-15 ENCOUNTER — Other Ambulatory Visit: Payer: Self-pay

## 2020-11-15 ENCOUNTER — Encounter (HOSPITAL_COMMUNITY)
Admission: RE | Admit: 2020-11-15 | Discharge: 2020-11-15 | Disposition: A | Discharge status: Home / Self Care | Payer: PPO | Source: Ambulatory Visit | Attending: Thoracic Surgery (Cardiothoracic Vascular Surgery) | Admitting: Thoracic Surgery (Cardiothoracic Vascular Surgery)

## 2020-11-15 VITALS — Wt 210.8 lb

## 2020-11-15 DIAGNOSIS — Z9889 Other specified postprocedural states: Secondary | ICD-10-CM | POA: Diagnosis not present

## 2020-11-15 NOTE — Progress Notes (Signed)
Daily Session Note  Patient Details  Name: Jesse [name] MRN: [id number] Date of Birth: 11-11-1945 Referring Provider:   Flowsheet Row CARDIAC REHAB PHASE II ORIENTATION from 11/02/2020 in [place]  Referring Provider Dr. Roxy Manns       Encounter Date: 11/15/2020  Check In:  Session Check In - 11/15/20 0930       Check-In   Supervising physician immediately available to respond to emergencies [facility] MD immediately available    Physician(s) Dr. Johnsie Cancel    Location [facility]    Staff Present Aundra Dubin, RN, Bjorn Loser, MS, ACSM-CEP, Exercise Physiologist    Virtual Visit No    Medication changes reported     No    Fall or balance concerns reported    Yes    Comments Pt has only fallen once in the past year, but he reports that he gets off balance easily.    Tobacco Cessation No Change    Warm-up and Cool-down Performed as group-led instruction    Resistance Training Performed Yes    VAD Patient? No    PAD/SET Patient? No      Pain Assessment   Currently in Pain? No/denies    Multiple Pain Sites No             Capillary Blood Glucose: No results found for this or any previous visit (from the past 24 hour(s)).    Social History   Tobacco Use  Smoking Status Former   Packs/day: 1.50   Years: 15.00   Pack years: 22.50   Types: Cigarettes   Quit date: 05/13/1978   Years since quitting: 42.5  Smokeless Tobacco Never  Tobacco Comments   quit in the early 80's    Goals Met:  Independence with exercise equipment Exercise tolerated well No report of cardiac concerns or symptoms Strength training completed today  Goals Unmet:  Not Applicable  Comments: Check out 1030.   Dr. Kathie Dike is Medical Director for [facility] Pulmonary Rehab.

## 2020-11-17 ENCOUNTER — Encounter (HOSPITAL_COMMUNITY)
Admission: RE | Admit: 2020-11-17 | Discharge: 2020-11-17 | Disposition: A | Discharge status: Home / Self Care | Payer: PPO | Source: Ambulatory Visit | Attending: Thoracic Surgery (Cardiothoracic Vascular Surgery) | Admitting: Thoracic Surgery (Cardiothoracic Vascular Surgery)

## 2020-11-17 ENCOUNTER — Other Ambulatory Visit: Payer: Self-pay

## 2020-11-17 DIAGNOSIS — Z9889 Other specified postprocedural states: Secondary | ICD-10-CM

## 2020-11-17 NOTE — Progress Notes (Signed)
Daily Session Note  Patient Details  Name: CALLEN VANCUREN MRN: [id number] Date of Birth: [birth year] Referring Provider:   Flowsheet Row CARDIAC REHAB PHASE II ORIENTATION from 11/02/2020 in [place]  Referring Provider Dr. Roxy Manns       Encounter Date: 11/17/2020  Check In:  Session Check In - 11/17/20 0930       Check-In   Supervising physician immediately available to respond to emergencies [facility] immediately available    Physician(s) Dr. Harrington Challenger    Location [facility]    Staff Present Aundra Dubin, RN, Bjorn Loser, MS, ACSM-CEP, Exercise Physiologist    Medication changes reported     No    Fall or balance concerns reported    Yes    Comments Pt has only fallen once in the past year, but he reports that he gets off balance easily.    Tobacco Cessation No Change    Warm-up and Cool-down Performed as group-led instruction    Resistance Training Performed Yes    VAD Patient? No    PAD/SET Patient? No      Pain Assessment   Currently in Pain? No/denies    Multiple Pain Sites No             Capillary Blood Glucose: No results found for this or any previous visit (from the past 24 hour(s)).    Social History   Tobacco Use  Smoking Status Former   Packs/day: 1.50   Years: 15.00   Pack years: 22.50   Types: Cigarettes   Quit date: 05/13/1978   Years since quitting: 42.5  Smokeless Tobacco Never  Tobacco Comments   quit in the early 80's    Goals Met:  Independence with exercise equipment Exercise tolerated well No report of cardiac concerns or symptoms Strength training completed today  Goals Unmet:  Not Applicable  Comments: Check out 1030.   Dr. Kathie Dike is Medical Director for [facility] Pulmonary Rehab.

## 2020-11-20 ENCOUNTER — Encounter (HOSPITAL_COMMUNITY)
Admission: RE | Admit: 2020-11-20 | Discharge: 2020-11-20 | Disposition: A | Discharge status: Home / Self Care | Payer: PPO | Source: Ambulatory Visit | Attending: Thoracic Surgery (Cardiothoracic Vascular Surgery) | Admitting: Thoracic Surgery (Cardiothoracic Vascular Surgery)

## 2020-11-20 ENCOUNTER — Other Ambulatory Visit: Payer: Self-pay

## 2020-11-20 DIAGNOSIS — Z9889 Other specified postprocedural states: Secondary | ICD-10-CM | POA: Diagnosis not present

## 2020-11-20 NOTE — Progress Notes (Signed)
Daily Session Note  Patient Details  Name: Jesse [name] MRN: [id number] Date of Birth: 08/21/1945 Referring Provider:   Flowsheet Row CARDIAC REHAB PHASE II ORIENTATION from 11/02/2020 in [place] [facility]  Referring Provider Dr. Owen       Encounter Date: 11/20/2020  Check In:  Session Check In - 11/20/20 0930       Check-In   Supervising physician immediately available to respond to emergencies [facility] MD immediately available    Physician(s) Dr. Branch    Location [facility]    Staff Present  , RN, BSN;Dalton Fletcher, MS, ACSM-CEP, Exercise Physiologist    Virtual Visit No    Medication changes reported     No    Fall or balance concerns reported    Yes    Comments Pt has only fallen once in the past year, but he reports that he gets off balance easily.    Tobacco Cessation No Change    Warm-up and Cool-down Performed as group-led instruction    Resistance Training Performed Yes    VAD Patient? No    PAD/SET Patient? No      Pain Assessment   Currently in Pain? No/denies    Multiple Pain Sites No             Capillary Blood Glucose: No results found for this or any previous visit (from the past 24 hour(s)).    Social History   Tobacco Use  Smoking Status Former   Packs/day: 1.50   Years: 15.00   Pack years: 22.50   Types: Cigarettes   Quit date: 05/13/1978   Years since quitting: 42.5  Smokeless Tobacco Never  Tobacco Comments   quit in the early 80's    Goals Met:  Independence with exercise equipment Exercise tolerated well No report of cardiac concerns or symptoms Strength training completed today  Goals Unmet:  Not Applicable  Comments: Check out 1030.   Dr. Jehanzeb Memon is Medical Director for [facility]. 

## 2020-11-22 ENCOUNTER — Encounter (HOSPITAL_COMMUNITY)
Admission: RE | Admit: 2020-11-22 | Discharge: 2020-11-22 | Disposition: A | Discharge status: Home / Self Care | Payer: PPO | Source: Ambulatory Visit | Attending: Thoracic Surgery (Cardiothoracic Vascular Surgery) | Admitting: Thoracic Surgery (Cardiothoracic Vascular Surgery)

## 2020-11-22 ENCOUNTER — Other Ambulatory Visit: Payer: Self-pay

## 2020-11-22 DIAGNOSIS — Z9889 Other specified postprocedural states: Secondary | ICD-10-CM | POA: Diagnosis not present

## 2020-11-22 NOTE — Progress Notes (Signed)
Daily Session Note  Patient Details  Name: DRYSTAN READER MRN: [id number] Date of Birth: 1946-03-03 Referring Provider:   Flowsheet Row CARDIAC REHAB PHASE II ORIENTATION from 11/02/2020 in [place]  Referring Provider Dr. Roxy Manns       Encounter Date: 11/22/2020  Check In:  Session Check In - 11/22/20 0930       Check-In   Physician(s) Dr. Harl Bowie    Location [facility]    Staff Present Aundra Dubin, RN, Bjorn Loser, MS, ACSM-CEP, Exercise Physiologist    Virtual Visit No    Medication changes reported     No    Fall or balance concerns reported    Yes    Comments Pt has only fallen once in the past year, but he reports that he gets off balance easily.    Tobacco Cessation No Change    Warm-up and Cool-down Performed as group-led instruction    Resistance Training Performed Yes    PAD/SET Patient? No      Pain Assessment   Currently in Pain? No/denies    Multiple Pain Sites No             Capillary Blood Glucose: No results found for this or any previous visit (from the past 24 hour(s)).    Social History   Tobacco Use  Smoking Status Former   Packs/day: 1.50   Years: 15.00   Pack years: 22.50   Types: Cigarettes   Quit date: 05/13/1978   Years since quitting: 42.5  Smokeless Tobacco Never  Tobacco Comments   quit in the early 80's    Goals Met:  Independence with exercise equipment Exercise tolerated well No report of cardiac concerns or symptoms Strength training completed today  Goals Unmet:  Not Applicable  Comments: Check out 1030.   Dr. Kathie Dike is Medical Director for [facility] Pulmonary Rehab.

## 2020-11-24 ENCOUNTER — Other Ambulatory Visit: Payer: Self-pay

## 2020-11-24 ENCOUNTER — Encounter (HOSPITAL_COMMUNITY)
Admission: RE | Admit: 2020-11-24 | Discharge: 2020-11-24 | Disposition: A | Discharge status: Home / Self Care | Payer: PPO | Source: Ambulatory Visit | Attending: Thoracic Surgery (Cardiothoracic Vascular Surgery) | Admitting: Thoracic Surgery (Cardiothoracic Vascular Surgery)

## 2020-11-24 DIAGNOSIS — Z9889 Other specified postprocedural states: Secondary | ICD-10-CM

## 2020-11-24 NOTE — Progress Notes (Signed)
Daily Session Note  Patient Details  Name: Jesse [name] MRN: [id number] Date of Birth: 26-Apr-1946 Referring Provider:   Flowsheet Row CARDIAC REHAB PHASE II ORIENTATION from 11/02/2020 in [place]  Referring Provider Dr. Roxy Manns       Encounter Date: 11/24/2020  Check In:  Session Check In - 11/24/20 0930       Check-In   Supervising physician immediately available to respond to emergencies [facility] MD immediately available    Physician(s) Dr. Harl Bowie    Location [facility]    Staff Present Aundra Dubin, RN, Bjorn Loser, MS, ACSM-CEP, Exercise Physiologist    Virtual Visit No    Medication changes reported     No    Fall or balance concerns reported    No    Tobacco Cessation No Change    Warm-up and Cool-down Performed as group-led instruction    Resistance Training Performed Yes    VAD Patient? No    PAD/SET Patient? No      Pain Assessment   Currently in Pain? No/denies    Multiple Pain Sites No             Capillary Blood Glucose: No results found for this or any previous visit (from the past 24 hour(s)).    Social History   Tobacco Use  Smoking Status Former   Packs/day: 1.50   Years: 15.00   Pack years: 22.50   Types: Cigarettes   Quit date: 05/13/1978   Years since quitting: 42.5  Smokeless Tobacco Never  Tobacco Comments   quit in the early 80's    Goals Met:  Independence with exercise equipment Exercise tolerated well No report of cardiac concerns or symptoms Strength training completed today  Goals Unmet:  Not Applicable  Comments: checkout time is 0930   Dr. Kathie Dike is Medical Director for [facility].

## 2020-11-27 ENCOUNTER — Encounter (HOSPITAL_COMMUNITY)
Admission: RE | Admit: 2020-11-27 | Discharge: 2020-11-27 | Disposition: A | Discharge status: Home / Self Care | Payer: PPO | Source: Ambulatory Visit | Attending: Thoracic Surgery (Cardiothoracic Vascular Surgery) | Admitting: Thoracic Surgery (Cardiothoracic Vascular Surgery)

## 2020-11-27 ENCOUNTER — Other Ambulatory Visit: Payer: Self-pay

## 2020-11-27 VITALS — Wt 211.4 lb

## 2020-11-27 DIAGNOSIS — Z9889 Other specified postprocedural states: Secondary | ICD-10-CM | POA: Diagnosis not present

## 2020-11-27 NOTE — Progress Notes (Signed)
Daily Session Note  Patient Details  Name: Jesse [name] MRN: [id number] Date of Birth: 07/03/1945 Referring Provider:   Flowsheet Row CARDIAC REHAB PHASE II ORIENTATION from 11/02/2020 in [facility]  Referring Provider Dr. Owen       Encounter Date: 11/27/2020  Check In:  Session Check In - 11/27/20 0930       Check-In   Supervising physician immediately available to respond to emergencies [facility] MD immediately available    Physician(s) Dr. Ross    Location [facility]    Staff Present  , RN, BSN;Dalton Fletcher, MS, ACSM-CEP, Exercise Physiologist    Medication changes reported     No    Fall or balance concerns reported    Yes    Comments Pt has only fallen once in the past year, but he reports that he gets off balance easily.    Tobacco Cessation No Change    Warm-up and Cool-down Performed as group-led instruction    Resistance Training Performed Yes    VAD Patient? No    PAD/SET Patient? No      Pain Assessment   Currently in Pain? No/denies    Multiple Pain Sites No             Capillary Blood Glucose: No results found for this or any previous visit (from the past 24 hour(s)).    Social History   Tobacco Use  Smoking Status Former   Packs/day: 1.50   Years: 15.00   Pack years: 22.50   Types: Cigarettes   Quit date: 05/13/1978   Years since quitting: 42.5  Smokeless Tobacco Never  Tobacco Comments   quit in the early 80's    Goals Met:  Independence with exercise equipment Exercise tolerated well No report of cardiac concerns or symptoms Strength training completed today  Goals Unmet:  Not Applicable  Comments: Check out 1030.   Dr. Jehanzeb Memon is Medical Director for [facility]. 

## 2020-11-29 ENCOUNTER — Encounter (HOSPITAL_COMMUNITY)
Admission: RE | Admit: 2020-11-29 | Discharge: 2020-11-29 | Disposition: A | Discharge status: Home / Self Care | Payer: PPO | Source: Ambulatory Visit | Attending: Thoracic Surgery (Cardiothoracic Vascular Surgery) | Admitting: Thoracic Surgery (Cardiothoracic Vascular Surgery)

## 2020-11-29 ENCOUNTER — Other Ambulatory Visit: Payer: Self-pay

## 2020-11-29 DIAGNOSIS — Z9889 Other specified postprocedural states: Secondary | ICD-10-CM

## 2020-11-29 NOTE — Progress Notes (Signed)
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   Warning Signs of Stroke / TIA -Discuss definition of a stroke, what the signs and symptoms are of a stroke, and how to identify when someone is having stroke. Flowsheet Row CARDIAC REHAB PHASE II EXERCISE from 11/29/2020 in [place]  Date 11/08/20  Educator DJ  Instruction Review Code 1- Verbalizes Understanding       Knowledge Questionnaire Score:  Knowledge Questionnaire Score - 11/02/20 0856       Knowledge Questionnaire Score   Pre Score 20/24             Core Components/Risk Factors/Patient Goals at Admission:  Personal Goals and Risk Factors at Admission -  11/02/20 0858       Core Components/Risk Factors/Patient Goals on Admission    Weight Management Yes;Weight Loss    Intervention Weight Management: Develop a combined nutrition and exercise program designed to reach desired caloric intake, while maintaining appropriate intake of nutrient and fiber, sodium and fats, and appropriate energy expenditure required for the weight goal.;Weight Management: Provide education and appropriate resources to help participant work on and attain dietary goals.;Weight Management/Obesity: Establish reasonable short term and long term weight goals.;Obesity: Provide education and appropriate resources to help participant work on and attain dietary goals.    Expected Outcomes Short Term: Continue to assess and modify interventions until short term weight is achieved;Long Term: Adherence to nutrition and physical activity/exercise program aimed toward attainment of established weight goal;Weight Maintenance: Understanding of the daily nutrition guidelines, which includes 25-35% calories from fat, 7% or less cal from saturated fats, less than 200mg  cholesterol, less than 1.5gm of sodium, & 5 or more servings of fruits and vegetables daily;Weight Loss: Understanding of general recommendations for a balanced deficit meal plan, which promotes 1-2 lb weight loss per week and includes a negative energy balance of [phone] kcal/d;Understanding recommendations for meals to include 15-35% energy as protein, 25-35% energy from fat, 35-60% energy from carbohydrates, less than 200mg  of dietary cholesterol, 20-35 gm of total fiber daily;Understanding of distribution of calorie intake throughout the day with the consumption of 4-5 meals/snacks    Improve shortness of breath with ADL's Yes    Intervention Provide education, individualized exercise plan and daily activity instruction to help decrease symptoms of SOB with activities of daily living.    Expected Outcomes Short Term: Improve  cardiorespiratory fitness to achieve a reduction of symptoms when performing ADLs;Long Term: Be able to perform more ADLs without symptoms or delay the onset of symptoms    Hypertension Yes    Intervention Provide education on lifestyle modifcations including regular physical activity/exercise, weight management, moderate sodium restriction and increased consumption of fresh fruit, vegetables, and low fat dairy, alcohol moderation, and smoking cessation.;Monitor prescription use compliance.    Expected Outcomes Short Term: Continued assessment and intervention until BP is < 140/52mm HG in hypertensive participants. < 130/74mm HG in hypertensive participants with diabetes, heart failure or chronic kidney disease.;Long Term: Maintenance of blood pressure at goal levels.    Lipids Yes    Intervention Provide education and support for participant on nutrition & aerobic/resistive exercise along with prescribed medications to achieve LDL 70mg , HDL >40mg .    Expected Outcomes Short Term: Participant states understanding of desired cholesterol values and is compliant with medications prescribed. Participant is following exercise prescription and nutrition guidelines.;Long Term: Cholesterol controlled with medications as prescribed, with individualized exercise RX and with personalized nutrition plan. Value goals: LDL < 70mg , HDL > 40 mg.             Core Components/Risk Factors/Patient Goals Review:   Goals and Risk Factor Review     Row Name 11/20/20 0737             Core Components/Risk Factors/Patient Goals Review   Personal Goals Review Other       Review Paitent referred to CR with S/P Mitral valve repair. He has completed 6 sessions. He does have multiple risk factors for CAD and is participating in the program for risk modification. His personal goals for the program are to improve his balance and get stronger. His blood pressure has been elevated at times with his initial reading. We will  continue to monitor his progress as he works towards meeting these goals.       Expected Outcomes Patient will complete the program meeting both personal and program goals.                Core Components/Risk Factors/Patient Goals at Discharge (Final Review):   Goals and Risk Factor Review - 11/20/20 0737       Core Components/Risk Factors/Patient Goals Review   Personal Goals Review Other    Review Paitent referred to CR with S/P Mitral valve repair. He has completed 6 sessions. He does have multiple risk factors for CAD and is participating in the program for risk modification. His personal goals for the program are to improve his balance and get stronger. His blood pressure has been elevated at times with his initial reading. We will continue to monitor his progress as he works towards meeting these goals.    Expected Outcomes Patient will complete the program meeting both personal and program goals.             ITP Comments:   Comments: ITP REVIEW Pt is making expected progress toward Cardiac Rehab goals after completing 11 sessions. Recommend continued exercise, life style modification, education, and increased stamina and strength.

## 2020-11-29 NOTE — Progress Notes (Signed)
Daily Session Note  Patient Details  Name: Jesse [name] MRN: [id number] Date of Birth: [birth year] Referring Provider:   Flowsheet Row CARDIAC REHAB PHASE II ORIENTATION from 11/02/2020 in [place]  Referring Provider Dr. Roxy Manns       Encounter Date: 11/29/2020  Check In:  Session Check In - 11/29/20 0915       Check-In   Supervising physician immediately available to respond to emergencies [facility] MD immediately available    Physician(s) Dr. Domenic Polite    Location [facility]    Staff Present Hoy Register, MS, ACSM-CEP, Exercise Physiologist;Other    Virtual Visit No    Medication changes reported     No    Fall or balance concerns reported    No    Tobacco Cessation No Change    Warm-up and Cool-down Performed as group-led instruction    Resistance Training Performed Yes    VAD Patient? No    PAD/SET Patient? No      Pain Assessment   Currently in Pain? No/denies    Multiple Pain Sites No             Capillary Blood Glucose: No results found for this or any previous visit (from the past 24 hour(s)).    Social History   Tobacco Use  Smoking Status Former   Packs/day: 1.50   Years: 15.00   Pack years: 22.50   Types: Cigarettes   Quit date: 05/13/1978   Years since quitting: 42.5  Smokeless Tobacco Never  Tobacco Comments   quit in the early 80's    Goals Met:  Independence with exercise equipment Exercise tolerated well No report of cardiac concerns or symptoms Strength training completed today  Goals Unmet:  Not Applicable  Comments: checkout time is 1030   Dr. Kathie Dike is Medical Director for [facility].

## 2020-12-01 ENCOUNTER — Encounter (HOSPITAL_COMMUNITY)
Admission: RE | Admit: 2020-12-01 | Discharge: 2020-12-01 | Disposition: A | Discharge status: Home / Self Care | Payer: PPO | Source: Ambulatory Visit | Attending: Thoracic Surgery (Cardiothoracic Vascular Surgery) | Admitting: Thoracic Surgery (Cardiothoracic Vascular Surgery)

## 2020-12-01 ENCOUNTER — Other Ambulatory Visit: Payer: Self-pay

## 2020-12-01 DIAGNOSIS — Z9889 Other specified postprocedural states: Secondary | ICD-10-CM | POA: Diagnosis not present

## 2020-12-01 NOTE — Progress Notes (Signed)
I have reviewed a Home Exercise Prescription with Jesse [name] . [name] is not currently exercising at home.  The patient was advised to walk 2 days a week for 30 minutes.  Teddrick and I discussed how to progress their exercise prescription.  The patient stated that their goals were to improve his balance and to get stronger.  The patient stated that they understand the exercise prescription.  We reviewed exercise guidelines, target heart rate during exercise, RPE Scale, weather conditions, NTG use, endpoints for exercise, warmup and cool down.  Patient is encouraged to come to me with any questions. I will continue to follow up with the patient to assist them with progression and safety.    Hoy Register, M.S., ACSM CEP

## 2020-12-01 NOTE — Progress Notes (Signed)
Daily Session Note  Patient Details  Name: Jesse [name] MRN: [id number] Date of Birth: 12-18-1945 Referring Provider:   Flowsheet Row CARDIAC REHAB PHASE II ORIENTATION from 11/02/2020 in [place]  Referring Provider Dr. Roxy Manns       Encounter Date: 12/01/2020  Check In:  Session Check In - 12/01/20 0930       Check-In   Supervising physician immediately available to respond to emergencies [facility] MD immediately available    Physician(s) Dr. Harl Bowie    [facility]    Staff Present Hoy Register, MS, ACSM-CEP, Exercise Physiologist;Other    Virtual Visit No    Medication changes reported     No    Fall or balance concerns reported    No    Tobacco Cessation No Change    Warm-up and Cool-down Performed as group-led instruction    Resistance Training Performed Yes    VAD Patient? No    PAD/SET Patient? No      Pain Assessment   Currently in Pain? No/denies    Multiple Pain Sites No             Capillary Blood Glucose: No results found for this or any previous visit (from the past 24 hour(s)).    Social History   Tobacco Use  Smoking Status Former   Packs/day: 1.50   Years: 15.00   Pack years: 22.50   Types: Cigarettes   Quit date: 05/13/1978   Years since quitting: 42.5  Smokeless Tobacco Never  Tobacco Comments   quit in the early 80's    Goals Met:  Independence with exercise equipment Exercise tolerated well No report of cardiac concerns or symptoms Strength training completed today  Goals Unmet:  Not Applicable  Comments: checkout time is 1030   Dr. Kathie Dike is Medical Director for [facility].

## 2020-12-04 ENCOUNTER — Other Ambulatory Visit: Payer: Self-pay

## 2020-12-04 ENCOUNTER — Encounter (HOSPITAL_COMMUNITY)
Admission: RE | Admit: 2020-12-04 | Discharge: 2020-12-04 | Disposition: A | Discharge status: Home / Self Care | Payer: PPO | Source: Ambulatory Visit | Attending: Thoracic Surgery (Cardiothoracic Vascular Surgery) | Admitting: Thoracic Surgery (Cardiothoracic Vascular Surgery)

## 2020-12-04 DIAGNOSIS — Z9889 Other specified postprocedural states: Secondary | ICD-10-CM

## 2020-12-04 NOTE — Progress Notes (Signed)
Daily Session Note  Patient Details  Name: Jesse [name] MRN: [id number] Date of Birth: 12/10/1945 Referring Provider:   Flowsheet Row CARDIAC REHAB PHASE II ORIENTATION from 11/02/2020 in [place]  Referring Provider Dr. Roxy Manns       Encounter Date: 12/04/2020  Check In:  Session Check In - 12/04/20 0930       Check-In   Supervising physician immediately available to respond to emergencies [facility] MD immediately available    Physician(s) Dr. Harrington Challenger    Location [facility]    Staff Present Geanie Cooley, RN;Dalton Fletcher, MS, ACSM-CEP, Exercise Physiologist    Virtual Visit No    Medication changes reported     No    Fall or balance concerns reported    No    Tobacco Cessation No Change    Warm-up and Cool-down Performed as group-led instruction    Resistance Training Performed Yes    VAD Patient? No    PAD/SET Patient? No      Pain Assessment   Currently in Pain? No/denies    Multiple Pain Sites No             Capillary Blood Glucose: No results found for this or any previous visit (from the past 24 hour(s)).    Social History   Tobacco Use  Smoking Status Former   Packs/day: 1.50   Years: 15.00   Pack years: 22.50   Types: Cigarettes   Quit date: 05/13/1978   Years since quitting: 42.5  Smokeless Tobacco Never  Tobacco Comments   quit in the early 80's    Goals Met:  Independence with exercise equipment Exercise tolerated well No report of cardiac concerns or symptoms Strength training completed today  Goals Unmet:  Not Applicable  Comments: check out @ 10:30am   Dr. Kathie Dike is Medical Director for [facility].

## 2020-12-06 ENCOUNTER — Encounter (HOSPITAL_COMMUNITY)
Admission: RE | Admit: 2020-12-06 | Discharge: 2020-12-06 | Disposition: A | Discharge status: Home / Self Care | Payer: PPO | Source: Ambulatory Visit | Attending: Thoracic Surgery (Cardiothoracic Vascular Surgery) | Admitting: Thoracic Surgery (Cardiothoracic Vascular Surgery)

## 2020-12-06 ENCOUNTER — Other Ambulatory Visit: Payer: Self-pay

## 2020-12-06 DIAGNOSIS — Z9889 Other specified postprocedural states: Secondary | ICD-10-CM | POA: Diagnosis not present

## 2020-12-06 NOTE — Progress Notes (Signed)
Daily Session Note  Patient Details  Name: Jesse [name] MRN: [id number] Date of Birth: 05/16/1945 Referring Provider:   Flowsheet Row CARDIAC REHAB PHASE II ORIENTATION from 11/02/2020 in [place]  Referring Provider Dr. Roxy Manns       Encounter Date: 12/06/2020  Check In:  Session Check In - 12/06/20 0930       Check-In   Supervising physician immediately available to respond to emergencies [facility] immediately available    Physician(s) Dr. Harl Bowie    [facility]    Staff Present Geanie Cooley, RN;Dalton Kris Mouton, MS, ACSM-CEP, Exercise Physiologist    Virtual Visit No    Medication changes reported     No    Fall or balance concerns reported    No    Tobacco Cessation No Change    Warm-up and Cool-down Performed as group-led instruction    Resistance Training Performed Yes    VAD Patient? No    PAD/SET Patient? No      Pain Assessment   Currently in Pain? No/denies    Multiple Pain Sites No             Capillary Blood Glucose: No results found for this or any previous visit (from the past 24 hour(s)).    Social History   Tobacco Use  Smoking Status Former   Packs/day: 1.50   Years: 15.00   Pack years: 22.50   Types: Cigarettes   Quit date: 05/13/1978   Years since quitting: 42.5  Smokeless Tobacco Never  Tobacco Comments   quit in the early 80's    Goals Met:  Independence with exercise equipment Exercise tolerated well No report of cardiac concerns or symptoms Strength training completed today  Goals Unmet:  Not Applicable  Comments: check out @ 10:30am   Dr. Kathie Dike is Medical Director for [facility].

## 2020-12-07 ENCOUNTER — Telehealth: Payer: Self-pay | Admitting: Family Medicine

## 2020-12-07 NOTE — Chronic Care Management (AMB) (Signed)
  Chronic Care Management   Outreach Note  12/07/2020 Name: Jesse [name] MRN: [id number] DOB: 02-13-1946  Referred by: Ria Bush, MD Reason for referral : No chief complaint on file.   An unsuccessful telephone outreach was attempted today. The patient was referred to the pharmacist for assistance with care management and care coordination.   Follow Up Plan:   Tatjana Dellinger Upstream Scheduler

## 2020-12-08 ENCOUNTER — Encounter (HOSPITAL_COMMUNITY)
Admission: RE | Admit: 2020-12-08 | Discharge: 2020-12-08 | Disposition: A | Discharge status: Home / Self Care | Payer: PPO | Source: Ambulatory Visit | Attending: Thoracic Surgery (Cardiothoracic Vascular Surgery) | Admitting: Thoracic Surgery (Cardiothoracic Vascular Surgery)

## 2020-12-08 ENCOUNTER — Other Ambulatory Visit: Payer: Self-pay

## 2020-12-08 DIAGNOSIS — Z9889 Other specified postprocedural states: Secondary | ICD-10-CM | POA: Diagnosis not present

## 2020-12-08 NOTE — Progress Notes (Signed)
Daily Session Note  Patient Details  Name: Jesse [name] MRN: [id number] Date of Birth: 1945/11/11 Referring Provider:   Flowsheet Row CARDIAC REHAB PHASE II ORIENTATION from 11/02/2020 in [place]  Referring Provider Dr. Roxy Manns       Encounter Date: 12/08/2020  Check In:  Session Check In - 12/08/20 0930       Check-In   Supervising physician immediately available to respond to emergencies [facility] MD immediately available    Physician(s) Dr. Harl Bowie    Location [facility]    Staff Present Geanie Cooley, RN;Dalton Kris Mouton, MS, ACSM-CEP, Exercise Physiologist    Virtual Visit No    Medication changes reported     No    Tobacco Cessation No Change    Warm-up and Cool-down Performed as group-led instruction    Resistance Training Performed Yes    VAD Patient? No    PAD/SET Patient? No      Pain Assessment   Currently in Pain? No/denies    Multiple Pain Sites No             Capillary Blood Glucose: No results found for this or any previous visit (from the past 24 hour(s)).    Social History   Tobacco Use  Smoking Status Former   Packs/day: 1.50   Years: 15.00   Pack years: 22.50   Types: Cigarettes   Quit date: 05/13/1978   Years since quitting: 42.6  Smokeless Tobacco Never  Tobacco Comments   quit in the early 80's    Goals Met:  Independence with exercise equipment Exercise tolerated well No report of cardiac concerns or symptoms Strength training completed today  Goals Unmet:  Not Applicable  Comments: check out @ 10:30   Dr. Kathie Dike is Medical Director for [facility].

## 2020-12-11 ENCOUNTER — Encounter (HOSPITAL_COMMUNITY)
Admission: RE | Admit: 2020-12-11 | Discharge: 2020-12-11 | Disposition: A | Discharge status: Home / Self Care | Payer: PPO | Source: Ambulatory Visit | Attending: Thoracic Surgery (Cardiothoracic Vascular Surgery) | Admitting: Thoracic Surgery (Cardiothoracic Vascular Surgery)

## 2020-12-11 ENCOUNTER — Other Ambulatory Visit: Payer: Self-pay

## 2020-12-11 VITALS — Wt 207.9 lb

## 2020-12-11 DIAGNOSIS — Z9889 Other specified postprocedural states: Secondary | ICD-10-CM | POA: Insufficient documentation

## 2020-12-11 NOTE — Progress Notes (Signed)
Daily Session Note  Patient Details  Name: Jesse [name] MRN: [id number] Date of Birth: 04-14-1946 Referring Provider:   Flowsheet Row CARDIAC REHAB PHASE II ORIENTATION from 11/02/2020 in [place]  Referring Provider Dr. Roxy Manns       Encounter Date: 12/11/2020  Check In:  Session Check In - 12/11/20 1100       Check-In   Supervising physician immediately available to respond to emergencies [facility] MD immediately available    Physician(s) Dr. Domenic Polite    Location [facility]    Staff Present Aundra Dubin, RN, Bjorn Loser, MS, ACSM-CEP, Exercise Physiologist    Virtual Visit No    Medication changes reported     No    Fall or balance concerns reported    No    Tobacco Cessation No Change    Warm-up and Cool-down Performed as group-led instruction    Resistance Training Performed Yes    VAD Patient? No    PAD/SET Patient? No      Pain Assessment   Currently in Pain? No/denies    Multiple Pain Sites No             Capillary Blood Glucose: No results found for this or any previous visit (from the past 24 hour(s)).    Social History   Tobacco Use  Smoking Status Former   Packs/day: 1.50   Years: 15.00   Pack years: 22.50   Types: Cigarettes   Quit date: 05/13/1978   Years since quitting: 42.6  Smokeless Tobacco Never  Tobacco Comments   quit in the early 80's    Goals Met:  Independence with exercise equipment Exercise tolerated well No report of cardiac concerns or symptoms Strength training completed today  Goals Unmet:  Not Applicable  Comments: checkout time is 1200   Dr. Kathie Dike is Medical Director for [facility].

## 2020-12-13 ENCOUNTER — Other Ambulatory Visit: Payer: Self-pay

## 2020-12-13 ENCOUNTER — Encounter (HOSPITAL_COMMUNITY)
Admission: RE | Admit: 2020-12-13 | Discharge: 2020-12-13 | Disposition: A | Discharge status: Home / Self Care | Payer: PPO | Source: Ambulatory Visit | Attending: Thoracic Surgery (Cardiothoracic Vascular Surgery) | Admitting: Thoracic Surgery (Cardiothoracic Vascular Surgery)

## 2020-12-13 DIAGNOSIS — Z9889 Other specified postprocedural states: Secondary | ICD-10-CM | POA: Diagnosis not present

## 2020-12-13 NOTE — Progress Notes (Signed)
Daily Session Note  Patient Details  Name: Jesse [name] MRN: [id number] Date of Birth: Oct 23, 1945 Referring Provider:   Flowsheet Row CARDIAC REHAB PHASE II ORIENTATION from 11/02/2020 in [place]  Referring Provider Dr. Roxy Manns       Encounter Date: 12/13/2020  Check In:  Session Check In - 12/13/20 1100       Check-In   Supervising physician immediately available to respond to emergencies [facility] MD immediately available    Physician(s) Dr. Domenic Polite    Location [facility]    Staff Present Aundra Dubin, RN, Bjorn Loser, MS, ACSM-CEP, Exercise Physiologist    Virtual Visit No    Medication changes reported     No    Fall or balance concerns reported    No    Tobacco Cessation No Change    Warm-up and Cool-down Performed as group-led instruction    Resistance Training Performed Yes    VAD Patient? No    PAD/SET Patient? No      Pain Assessment   Currently in Pain? No/denies    Multiple Pain Sites No             Capillary Blood Glucose: No results found for this or any previous visit (from the past 24 hour(s)).    Social History   Tobacco Use  Smoking Status Former   Packs/day: 1.50   Years: 15.00   Pack years: 22.50   Types: Cigarettes   Quit date: 05/13/1978   Years since quitting: 42.6  Smokeless Tobacco Never  Tobacco Comments   quit in the early 80's    Goals Met:  Independence with exercise equipment Exercise tolerated well No report of cardiac concerns or symptoms Strength training completed today  Goals Unmet:  Not Applicable  Comments: checkout time is 1200   Dr. Kathie Dike is Medical Director for [facility].

## 2020-12-14 ENCOUNTER — Telehealth: Payer: Self-pay | Admitting: Family Medicine

## 2020-12-14 NOTE — Chronic Care Management (AMB) (Signed)
  Chronic Care Management   Outreach Note  12/14/2020 Name: Jesse [name] MRN: [id number] DOB: 02/17/1946  Referred by: Ria Bush, MD Reason for referral : No chief complaint on file.   A second unsuccessful telephone outreach was attempted today. The patient was referred to pharmacist for assistance with care management and care coordination.  Follow Up Plan:   Tatjana Dellinger Upstream Scheduler

## 2020-12-15 ENCOUNTER — Encounter (HOSPITAL_COMMUNITY)
Admission: RE | Admit: 2020-12-15 | Discharge: 2020-12-15 | Disposition: A | Discharge status: Home / Self Care | Payer: PPO | Source: Ambulatory Visit | Attending: Thoracic Surgery (Cardiothoracic Vascular Surgery) | Admitting: Thoracic Surgery (Cardiothoracic Vascular Surgery)

## 2020-12-15 ENCOUNTER — Other Ambulatory Visit: Payer: Self-pay

## 2020-12-15 DIAGNOSIS — Z9889 Other specified postprocedural states: Secondary | ICD-10-CM | POA: Diagnosis not present

## 2020-12-15 NOTE — Progress Notes (Signed)
Daily Session Note  Patient Details  Name: Jesse [name] MRN: [id number] Date of Birth: 17-Mar-1946 Referring Provider:   Flowsheet Row CARDIAC REHAB PHASE II ORIENTATION from 11/02/2020 in [place]  Referring Provider Dr. Roxy Manns       Encounter Date: 12/15/2020  Check In:  Session Check In - 12/15/20 1100       Check-In   Supervising physician immediately available to respond to emergencies [facility] MD immediately available    Physician(s) Dr. Harl Bowie    Location [facility]    Staff Present Aundra Dubin, RN, Bjorn Loser, MS, ACSM-CEP, Exercise Physiologist    Virtual Visit No    Medication changes reported     No    Fall or balance concerns reported    No    Tobacco Cessation No Change    Warm-up and Cool-down Performed as group-led instruction    Resistance Training Performed Yes    VAD Patient? No    PAD/SET Patient? No      Pain Assessment   Currently in Pain? No/denies    Multiple Pain Sites No             Capillary Blood Glucose: No results found for this or any previous visit (from the past 24 hour(s)).    Social History   Tobacco Use  Smoking Status Former   Packs/day: 1.50   Years: 15.00   Pack years: 22.50   Types: Cigarettes   Quit date: 05/13/1978   Years since quitting: 42.6  Smokeless Tobacco Never  Tobacco Comments   quit in the early 80's    Goals Met:  Independence with exercise equipment Exercise tolerated well No report of cardiac concerns or symptoms Strength training completed today  Goals Unmet:  Not Applicable  Comments: checkout time is 1200   Dr. Kathie Dike is Medical Director for [facility].

## 2020-12-18 ENCOUNTER — Encounter (HOSPITAL_COMMUNITY)
Admission: RE | Admit: 2020-12-18 | Discharge: 2020-12-18 | Disposition: A | Discharge status: Home / Self Care | Payer: PPO | Source: Ambulatory Visit | Attending: Thoracic Surgery (Cardiothoracic Vascular Surgery) | Admitting: Thoracic Surgery (Cardiothoracic Vascular Surgery)

## 2020-12-18 ENCOUNTER — Other Ambulatory Visit: Payer: Self-pay

## 2020-12-18 DIAGNOSIS — Z9889 Other specified postprocedural states: Secondary | ICD-10-CM | POA: Diagnosis not present

## 2020-12-18 NOTE — Progress Notes (Signed)
Daily Session Note  Patient Details  Name: Jesse [name] MRN: [id number] Date of Birth: January 16, 1946 Referring Provider:   Flowsheet Row CARDIAC REHAB PHASE II ORIENTATION from 11/02/2020 in [place]  Referring Provider Dr. Roxy Manns       Encounter Date: 12/18/2020  Check In:  Session Check In - 12/18/20 1100       Check-In   Supervising physician immediately available to respond to emergencies [facility] MD immediately available    Physician(s) Dr. Johnsie Cancel    Location [facility]    Staff Present Aundra Dubin, RN, Bjorn Loser, MS, ACSM-CEP, Exercise Physiologist    Virtual Visit No    Medication changes reported     No    Fall or balance concerns reported    No    Tobacco Cessation No Change    Warm-up and Cool-down Performed as group-led instruction    Resistance Training Performed Yes    VAD Patient? No    PAD/SET Patient? No      Pain Assessment   Currently in Pain? No/denies    Multiple Pain Sites No             Capillary Blood Glucose: No results found for this or any previous visit (from the past 24 hour(s)).    Social History   Tobacco Use  Smoking Status Former   Packs/day: 1.50   Years: 15.00   Pack years: 22.50   Types: Cigarettes   Quit date: 05/13/1978   Years since quitting: 42.6  Smokeless Tobacco Never  Tobacco Comments   quit in the early 80's    Goals Met:  Independence with exercise equipment Exercise tolerated well No report of cardiac concerns or symptoms Strength training completed today  Goals Unmet:  Not Applicable  Comments: checkout time is 1200   Dr. Kathie Dike is Medical Director for [facility].

## 2020-12-20 ENCOUNTER — Encounter (HOSPITAL_COMMUNITY)
Admission: RE | Admit: 2020-12-20 | Discharge: 2020-12-20 | Disposition: A | Discharge status: Home / Self Care | Payer: PPO | Source: Ambulatory Visit | Attending: Thoracic Surgery (Cardiothoracic Vascular Surgery) | Admitting: Thoracic Surgery (Cardiothoracic Vascular Surgery)

## 2020-12-20 ENCOUNTER — Other Ambulatory Visit: Payer: Self-pay

## 2020-12-20 DIAGNOSIS — Z9889 Other specified postprocedural states: Secondary | ICD-10-CM | POA: Diagnosis not present

## 2020-12-20 NOTE — Progress Notes (Signed)
Daily Session Note  Patient Details  Name: Jesse [name] MRN: [id number] Date of Birth: [birth year] Referring Provider:   Flowsheet Row CARDIAC REHAB PHASE II ORIENTATION from 11/02/2020 in [place]  Referring Provider Dr. Roxy Manns       Encounter Date: 12/20/2020  Check In:  Session Check In - 12/20/20 1100       Check-In   Supervising physician immediately available to respond to emergencies [facility] MD immediately available    Physician(s) Dr. Harrington Challenger    Location [facility]    Staff Present Aundra Dubin, RN, Bjorn Loser, MS, ACSM-CEP, Exercise Physiologist;Other    Virtual Visit No    Medication changes reported     No    Fall or balance concerns reported    No    Tobacco Cessation No Change    Warm-up and Cool-down Performed as group-led instruction    Resistance Training Performed Yes    VAD Patient? No    PAD/SET Patient? No      Pain Assessment   Currently in Pain? No/denies    Multiple Pain Sites No             Capillary Blood Glucose: No results found for this or any previous visit (from the past 24 hour(s)).    Social History   Tobacco Use  Smoking Status Former   Packs/day: 1.50   Years: 15.00   Pack years: 22.50   Types: Cigarettes   Quit date: 05/13/1978   Years since quitting: 42.6  Smokeless Tobacco Never  Tobacco Comments   quit in the early 80's    Goals Met:  Independence with exercise equipment Exercise tolerated well No report of cardiac concerns or symptoms Strength training completed today  Goals Unmet:  Not Applicable  Comments: checkout time is 1200   Dr. Kathie Dike is Medical Director for [facility].

## 2020-12-22 ENCOUNTER — Telehealth: Payer: Self-pay | Admitting: Family Medicine

## 2020-12-22 ENCOUNTER — Other Ambulatory Visit: Payer: Self-pay

## 2020-12-22 ENCOUNTER — Encounter (HOSPITAL_COMMUNITY)
Admission: RE | Admit: 2020-12-22 | Discharge: 2020-12-22 | Disposition: A | Discharge status: Home / Self Care | Payer: PPO | Source: Ambulatory Visit | Attending: Thoracic Surgery (Cardiothoracic Vascular Surgery) | Admitting: Thoracic Surgery (Cardiothoracic Vascular Surgery)

## 2020-12-22 DIAGNOSIS — Z9889 Other specified postprocedural states: Secondary | ICD-10-CM | POA: Diagnosis not present

## 2020-12-22 NOTE — Progress Notes (Signed)
  Chronic Care Management   Outreach Note  12/22/2020 Name: Jesse [name] MRN: [id number] DOB: [birth year]  Referred by: Ria Bush, MD Reason for referral : No chief complaint on file.   Third unsuccessful telephone outreach was attempted today. The patient was referred to the pharmacist for assistance with care management and care coordination.   Follow Up Plan:  Tatjana Dellinger Upstream Scheduler

## 2020-12-22 NOTE — Progress Notes (Signed)
Daily Session Note  Patient Details  Name: Jesse [name] MRN: [id number] Date of Birth: 1946/04/01 Referring Provider:   Flowsheet Row CARDIAC REHAB PHASE II ORIENTATION from 11/02/2020 in [place]  Referring Provider Dr. Roxy Manns       Encounter Date: 12/22/2020  Check In:  Session Check In - 12/22/20 1100       Check-In   Supervising physician immediately available to respond to emergencies [facility] MD immediately available    Physician(s) Dr. Harrington Challenger    Location [facility]    Staff Present Hoy Register, MS, ACSM-CEP, Exercise Physiologist;Other    Virtual Visit No    Medication changes reported     No    Fall or balance concerns reported    No    Tobacco Cessation No Change    Warm-up and Cool-down Performed as group-led instruction    Resistance Training Performed Yes    VAD Patient? No    PAD/SET Patient? No      Pain Assessment   Currently in Pain? No/denies    Multiple Pain Sites No             Capillary Blood Glucose: No results found for this or any previous visit (from the past 24 hour(s)).    Social History   Tobacco Use  Smoking Status Former   Packs/day: 1.50   Years: 15.00   Pack years: 22.50   Types: Cigarettes   Quit date: 05/13/1978   Years since quitting: 42.6  Smokeless Tobacco Never  Tobacco Comments   quit in the early 80's    Goals Met:  Independence with exercise equipment Exercise tolerated well No report of cardiac concerns or symptoms Strength training completed today  Goals Unmet:  Not Applicable  Comments: checkout time is 1200   Dr. Kathie Dike is Medical Director for [facility].

## 2020-12-25 ENCOUNTER — Other Ambulatory Visit: Payer: Self-pay

## 2020-12-25 ENCOUNTER — Encounter (HOSPITAL_COMMUNITY)
Admission: RE | Admit: 2020-12-25 | Discharge: 2020-12-25 | Disposition: A | Discharge status: Home / Self Care | Payer: PPO | Source: Ambulatory Visit | Attending: Thoracic Surgery (Cardiothoracic Vascular Surgery) | Admitting: Thoracic Surgery (Cardiothoracic Vascular Surgery)

## 2020-12-25 VITALS — Wt 212.5 lb

## 2020-12-25 DIAGNOSIS — Z9889 Other specified postprocedural states: Secondary | ICD-10-CM

## 2020-12-25 NOTE — Progress Notes (Signed)
Daily Session Note  Patient Details  Name: Jesse [name] MRN: [id number] Date of Birth: [birth year] Referring Provider:   Flowsheet Row CARDIAC REHAB PHASE II ORIENTATION from 11/02/2020 in [place]  Referring Provider Dr. Roxy Manns       Encounter Date: 12/25/2020  Check In:  Session Check In - 12/25/20 1100       Check-In   Supervising physician immediately available to respond to emergencies [facility] MD immediately available    Physician(s) Dr. Domenic Polite    Location [facility]    Staff Present Hoy Register, MS, ACSM-CEP, Exercise Physiologist;Other    Virtual Visit No    Medication changes reported     No    Fall or balance concerns reported    No    Tobacco Cessation No Change    Warm-up and Cool-down Performed as group-led instruction    Resistance Training Performed Yes    VAD Patient? No    PAD/SET Patient? No      Pain Assessment   Currently in Pain? No/denies    Multiple Pain Sites No             Capillary Blood Glucose: No results found for this or any previous visit (from the past 24 hour(s)).    Social History   Tobacco Use  Smoking Status Former   Packs/day: 1.50   Years: 15.00   Pack years: 22.50   Types: Cigarettes   Quit date: 05/13/1978   Years since quitting: 42.6  Smokeless Tobacco Never  Tobacco Comments   quit in the early 80's    Goals Met:  Independence with exercise equipment Exercise tolerated well No report of cardiac concerns or symptoms Strength training completed today  Goals Unmet:  Not Applicable  Comments: checkout time is 1200   Dr. Kathie Dike is Medical Director for [facility].

## 2020-12-27 ENCOUNTER — Other Ambulatory Visit: Payer: Self-pay

## 2020-12-27 ENCOUNTER — Encounter (HOSPITAL_COMMUNITY)
Admission: RE | Admit: 2020-12-27 | Discharge: 2020-12-27 | Disposition: A | Discharge status: Home / Self Care | Payer: PPO | Source: Ambulatory Visit | Attending: Thoracic Surgery (Cardiothoracic Vascular Surgery) | Admitting: Thoracic Surgery (Cardiothoracic Vascular Surgery)

## 2020-12-27 DIAGNOSIS — Z9889 Other specified postprocedural states: Secondary | ICD-10-CM | POA: Diagnosis not present

## 2020-12-27 NOTE — Progress Notes (Signed)
Daily Session Note  Patient Details  Name: Jesse [name] MRN: [id number] Date of Birth: 03-02-1946 Referring Provider:   Flowsheet Row CARDIAC REHAB PHASE II ORIENTATION from 11/02/2020 in [place]  Referring Provider Dr. Roxy Manns       Encounter Date: 12/27/2020  Check In:  Session Check In - 12/27/20 1100       Check-In   Supervising physician immediately available to respond to emergencies [facility] MD immediately available    Physician(s) Dr. Harl Bowie    Location [facility]    Staff Present Aundra Dubin, RN, BSN;Other    Virtual Visit No    Medication changes reported     No    Fall or balance concerns reported    Yes    Comments Pt has only fallen once in the past year, but he reports that he gets off balance easily.    Tobacco Cessation No Change    Warm-up and Cool-down Performed as group-led instruction    Resistance Training Performed Yes    VAD Patient? No    PAD/SET Patient? No      Pain Assessment   Currently in Pain? No/denies    Multiple Pain Sites No             Capillary Blood Glucose: No results found for this or any previous visit (from the past 24 hour(s)).    Social History   Tobacco Use  Smoking Status Former   Packs/day: 1.50   Years: 15.00   Pack years: 22.50   Types: Cigarettes   Quit date: 05/13/1978   Years since quitting: 42.6  Smokeless Tobacco Never  Tobacco Comments   quit in the early 80's    Goals Met:  Independence with exercise equipment Exercise tolerated well No report of cardiac concerns or symptoms Strength training completed today  Goals Unmet:  Not Applicable  Comments: Check out 1200.   Dr. Kathie Dike is Medical Director for [facility].

## 2020-12-27 NOTE — Progress Notes (Signed)
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 Outcomes Patient will continue to have no psychosocial issues identifie.d Patient will continue to have no psychosocial issues identified.      Interventions Stress management education;Relaxation education;Encouraged to attend Cardiac Rehabilitation for the exercise Stress management education;Relaxation education;Encouraged to attend Cardiac Rehabilitation for the exercise      Continue Psychosocial Services  No Follow up required No Follow up required               Psychosocial Discharge (Final Psychosocial Re-Evaluation):  Psychosocial Re-Evaluation - 12/18/20 0732       Psychosocial Re-Evaluation   Current issues with None Identified    Comments Patient continues to have no psychosocial barriers identified. He has completed 18 sessions and is doing well in the program. He continues to enjoy coming to the program and  demonstrates an interest in improving his health. We will continue to monitor his progress.    Expected Outcomes Patient will continue to have no psychosocial issues identified.    Interventions Stress management education;Relaxation education;Encouraged to attend Cardiac Rehabilitation for the exercise    Continue Psychosocial Services  No Follow up required             Vocational Rehabilitation: Provide vocational rehab assistance to qualifying candidates.   Vocational Rehab Evaluation & Intervention:  Vocational Rehab - 11/02/20 0858       Initial Vocational Rehab Evaluation & Intervention   Assessment shows need for Vocational Rehabilitation No             Education: Education Goals: Education classes will be provided on a weekly basis, covering required topics. Participant will state understanding/return demonstration of topics presented.  Learning Barriers/Preferences:  Learning Barriers/Preferences - 11/02/20 [id number]  Learning Barriers/Preferences   Learning Barriers Hearing    Learning Preferences Individual Instruction;Skilled Demonstration             Education Topics: Hypertension, Hypertension Reduction -Define heart disease and high blood pressure. Discus how high blood pressure affects the body and ways to reduce high blood pressure. Flowsheet Row CARDIAC REHAB PHASE II EXERCISE from 12/20/2020 in [place]  Date 11/15/20  Educator DJ  Instruction Review Code 1- Verbalizes Understanding       Exercise and Your Heart -Discuss why it is important to exercise, the FITT principles of exercise, normal and abnormal responses to exercise, and how to exercise safely. Flowsheet Row CARDIAC REHAB PHASE II EXERCISE from 12/20/2020 in [place]  Date 11/22/20  Educator DJ  Instruction Review Code 1- Verbalizes Understanding       Angina -Discuss definition of angina, causes of angina, treatment of  angina, and how to decrease risk of having angina. Flowsheet Row CARDIAC REHAB PHASE II EXERCISE from 12/20/2020 in [place]  Date 11/29/20  Educator DF  Instruction Review Code 2- Demonstrated Understanding       Cardiac Medications -Review what the following cardiac medications are used for, how they affect the body, and side effects that may occur when taking the medications.  Medications include Aspirin, Beta blockers, calcium channel blockers, ACE Inhibitors, angiotensin receptor blockers, diuretics, digoxin, and antihyperlipidemics. Flowsheet Row CARDIAC REHAB PHASE II EXERCISE from 12/20/2020 in [place]  Date 12/06/20  Educator pb  Instruction Review Code 1- Verbalizes Understanding       Congestive Heart Failure -Discuss the definition of CHF, how to live with CHF, the signs and symptoms of CHF, and how keep track of weight and sodium intake. Flowsheet Row CARDIAC REHAB PHASE II EXERCISE from 12/20/2020 in [place]  Date 12/13/20  Educator DF  Instruction Review Code 2- Demonstrated Understanding       Heart Disease and Intimacy -Discus the effect sexual activity has on the heart, how changes occur during intimacy as we age, and safety during sexual activity. Flowsheet Row CARDIAC REHAB PHASE II EXERCISE from 12/20/2020 in [place]  Date 12/20/20  Educator DF  Instruction Review Code 2- Demonstrated Understanding       Smoking Cessation / COPD -Discuss different methods to quit smoking, the health benefits of quitting smoking, and the definition of COPD.   Nutrition I: Fats -Discuss the types of cholesterol, what cholesterol does to the heart, and how cholesterol levels can be controlled.   Nutrition II: Labels -Discuss the different components of food labels and how to read food label   Heart Parts/Heart Disease and PAD -Discuss the anatomy of the heart, the  pathway of blood circulation through the heart, and these are affected by heart disease.   Stress I: Signs and Symptoms -Discuss the causes of stress, how stress may lead to anxiety and depression, and ways to limit stress.   Stress II: Relaxation -Discuss different types of relaxation techniques to limit stress.   Warning Signs of Stroke / TIA -Discuss definition of a stroke, what the signs and symptoms are of a stroke, and how to identify when someone is having stroke. Flowsheet Row CARDIAC REHAB PHASE II EXERCISE from 12/20/2020 in [place]  Date 11/08/20  Educator DJ  Instruction Review Code 1- Verbalizes Understanding       Knowledge Questionnaire Score:  Knowledge Questionnaire Score - 11/02/20  0856       Knowledge Questionnaire Score   Pre Score 20/24             Core Components/Risk Factors/Patient Goals at Admission:  Personal Goals and Risk Factors at Admission - 11/02/20 0858       Core Components/Risk Factors/Patient Goals on Admission    Weight Management Yes;Weight Loss    Intervention Weight Management: Develop a combined nutrition and exercise program designed to reach desired caloric intake, while maintaining appropriate intake of nutrient and fiber, sodium and fats, and appropriate energy expenditure required for the weight goal.;Weight Management: Provide education and appropriate resources to help participant work on and attain dietary goals.;Weight Management/Obesity: Establish reasonable short term and long term weight goals.;Obesity: Provide education and appropriate resources to help participant work on and attain dietary goals.    Expected Outcomes Short Term: Continue to assess and modify interventions until short term weight is achieved;Long Term: Adherence to nutrition and physical activity/exercise program aimed toward attainment of established weight goal;Weight Maintenance: Understanding of the daily nutrition guidelines,  which includes 25-35% calories from fat, 7% or less cal from saturated fats, less than '200mg'$  cholesterol, less than 1.5gm of sodium, & 5 or more servings of fruits and vegetables daily;Weight Loss: Understanding of general recommendations for a balanced deficit meal plan, which promotes 1-2 lb weight loss per week and includes a negative energy balance of [phone] kcal/d;Understanding recommendations for meals to include 15-35% energy as protein, 25-35% energy from fat, 35-60% energy from carbohydrates, less than '200mg'$  of dietary cholesterol, 20-35 gm of total fiber daily;Understanding of distribution of calorie intake throughout the day with the consumption of 4-5 meals/snacks    Improve shortness of breath with ADL's Yes    Intervention Provide education, individualized exercise plan and daily activity instruction to help decrease symptoms of SOB with activities of daily living.    Expected Outcomes Short Term: Improve cardiorespiratory fitness to achieve a reduction of symptoms when performing ADLs;Long Term: Be able to perform more ADLs without symptoms or delay the onset of symptoms    Hypertension Yes    Intervention Provide education on lifestyle modifcations including regular physical activity/exercise, weight management, moderate sodium restriction and increased consumption of fresh fruit, vegetables, and low fat dairy, alcohol moderation, and smoking cessation.;Monitor prescription use compliance.    Expected Outcomes Short Term: Continued assessment and intervention until BP is < 140/78m HG in hypertensive participants. < 130/848mHG in hypertensive participants with diabetes, heart failure or chronic kidney disease.;Long Term: Maintenance of blood pressure at goal levels.    Lipids Yes    Intervention Provide education and support for participant on nutrition & aerobic/resistive exercise along with prescribed medications to achieve LDL '70mg'$ , HDL >'40mg'$ .    Expected Outcomes Short Term:  Participant states understanding of desired cholesterol values and is compliant with medications prescribed. Participant is following exercise prescription and nutrition guidelines.;Long Term: Cholesterol controlled with medications as prescribed, with individualized exercise RX and with personalized nutrition plan. Value goals: LDL < '70mg'$ , HDL > 40 mg.             Core Components/Risk Factors/Patient Goals Review:   Goals and Risk Factor Review     Row Name 11/20/20 0737 12/18/20 0734           Core Components/Risk Factors/Patient Goals Review   Personal Goals Review Other Other      Review Paitent referred to CR with S/P Mitral valve repair. He has completed 6 sessions. He does  have multiple risk factors for CAD and is participating in the program for risk modification. His personal goals for the program are to improve his balance and get stronger. His blood pressure has been elevated at times with his initial reading. We will continue to monitor his progress as he works towards meeting these goals. Patient has completed 18 sessions losing 2 lbs since last 30 day review. He is doing well in the program with consistent attendance and progressions. His blood has been elevated at times above goal 140 to 150/80 but has improved in the past few weeks. We will continue to monitor. His personal goals for the program are to improve his balance and get stronger. We will continue to monitor his progress as he works towards meeting these goals.      Expected Outcomes Patient will complete the program meeting both personal and program goals. Patient will complete the program meeting both personal and program goals.               Core Components/Risk Factors/Patient Goals at Discharge (Final Review):   Goals and Risk Factor Review - 12/18/20 0734       Core Components/Risk Factors/Patient Goals Review   Personal Goals Review Other    Review Patient has completed 18 sessions losing 2 lbs since  last 30 day review. He is doing well in the program with consistent attendance and progressions. His blood has been elevated at times above goal 140 to 150/80 but has improved in the past few weeks. We will continue to monitor. His personal goals for the program are to improve his balance and get stronger. We will continue to monitor his progress as he works towards meeting these goals.    Expected Outcomes Patient will complete the program meeting both personal and program goals.             ITP Comments:   Comments: ITP REVIEW Pt is making expected progress toward Cardiac Rehab goals after completing 22 sessions. Recommend continued exercise, life style modification, education, and increased stamina and strength.

## 2020-12-29 ENCOUNTER — Other Ambulatory Visit: Payer: Self-pay

## 2020-12-29 ENCOUNTER — Encounter (HOSPITAL_COMMUNITY)
Admission: RE | Admit: 2020-12-29 | Discharge: 2020-12-29 | Disposition: A | Discharge status: Home / Self Care | Payer: PPO | Source: Ambulatory Visit | Attending: Thoracic Surgery (Cardiothoracic Vascular Surgery) | Admitting: Thoracic Surgery (Cardiothoracic Vascular Surgery)

## 2020-12-29 DIAGNOSIS — Z9889 Other specified postprocedural states: Secondary | ICD-10-CM | POA: Diagnosis not present

## 2020-12-29 NOTE — Progress Notes (Signed)
Daily Session Note  Patient Details  Name: Jesse [name] MRN: [id number] Date of Birth: 01-Jun-1945 Referring Provider:   Flowsheet Row CARDIAC REHAB PHASE II ORIENTATION from 11/02/2020 in [place]  Referring Provider Dr. Roxy Manns       Encounter Date: 12/29/2020  Check In:  Session Check In - 12/29/20 1100       Check-In   Supervising physician immediately available to respond to emergencies [facility] MD immediately available    Physician(s) Dr. Johnsie Cancel    Location [facility]    Staff Present Hoy Register, MS, ACSM-CEP, Exercise Physiologist;Debra Wynetta Emery, RN, BSN    Virtual Visit No    Medication changes reported     No    Fall or balance concerns reported    No    Tobacco Cessation No Change    Warm-up and Cool-down Performed as group-led instruction    Resistance Training Performed Yes    VAD Patient? No      Pain Assessment   Currently in Pain? No/denies    Multiple Pain Sites No             Capillary Blood Glucose: No results found for this or any previous visit (from the past 24 hour(s)).    Social History   Tobacco Use  Smoking Status Former   Packs/day: 1.50   Years: 15.00   Pack years: 22.50   Types: Cigarettes   Quit date: 05/13/1978   Years since quitting: 42.6  Smokeless Tobacco Never  Tobacco Comments   quit in the early 80's    Goals Met:  Independence with exercise equipment Exercise tolerated well No report of cardiac concerns or symptoms Strength training completed today  Goals Unmet:  Not Applicable  Comments: checkout time is 1200   Dr. Kathie Dike is Medical Director for [facility] Pulmonary Rehab.

## 2021-01-01 ENCOUNTER — Other Ambulatory Visit: Payer: Self-pay

## 2021-01-01 ENCOUNTER — Encounter (HOSPITAL_COMMUNITY)
Admission: RE | Admit: 2021-01-01 | Discharge: 2021-01-01 | Disposition: A | Discharge status: Home / Self Care | Payer: PPO | Source: Ambulatory Visit | Attending: Thoracic Surgery (Cardiothoracic Vascular Surgery) | Admitting: Thoracic Surgery (Cardiothoracic Vascular Surgery)

## 2021-01-01 DIAGNOSIS — Z9889 Other specified postprocedural states: Secondary | ICD-10-CM | POA: Diagnosis not present

## 2021-01-01 NOTE — Progress Notes (Signed)
Daily Session Note  Patient Details  Name: Jesse [name] MRN: [id number] Date of Birth: [birth year] Referring Provider:   Flowsheet Row CARDIAC REHAB PHASE II ORIENTATION from 11/02/2020 in [place]  Referring Provider Dr. Roxy Manns       Encounter Date: 01/01/2021  Check In:  Session Check In - 01/01/21 1100       Check-In   Supervising physician immediately available to respond to emergencies [facility] MD immediately available    Physician(s) Dr. Johnsie Cancel    Location [facility]    Staff Present Hoy Register, MS, ACSM-CEP, Exercise Physiologist;Other    Virtual Visit No    Medication changes reported     No    Fall or balance concerns reported    No    Tobacco Cessation No Change    Warm-up and Cool-down Performed as group-led instruction    Resistance Training Performed Yes    VAD Patient? No    PAD/SET Patient? No      Pain Assessment   Currently in Pain? No/denies    Multiple Pain Sites No             Capillary Blood Glucose: No results found for this or any previous visit (from the past 24 hour(s)).    Social History   Tobacco Use  Smoking Status Former   Packs/day: 1.50   Years: 15.00   Pack years: 22.50   Types: Cigarettes   Quit date: 05/13/1978   Years since quitting: 42.6  Smokeless Tobacco Never  Tobacco Comments   quit in the early 80's    Goals Met:  Independence with exercise equipment Exercise tolerated well No report of cardiac concerns or symptoms Strength training completed today  Goals Unmet:  Not Applicable  Comments: checkout time is 1200   Dr. Kathie Dike is Medical Director for [facility] Pulmonary Rehab.

## 2021-01-03 ENCOUNTER — Other Ambulatory Visit: Payer: Self-pay

## 2021-01-03 ENCOUNTER — Encounter (HOSPITAL_COMMUNITY)
Admission: RE | Admit: 2021-01-03 | Discharge: 2021-01-03 | Disposition: A | Discharge status: Home / Self Care | Payer: PPO | Source: Ambulatory Visit | Attending: Thoracic Surgery (Cardiothoracic Vascular Surgery) | Admitting: Thoracic Surgery (Cardiothoracic Vascular Surgery)

## 2021-01-03 DIAGNOSIS — Z9889 Other specified postprocedural states: Secondary | ICD-10-CM

## 2021-01-03 NOTE — Progress Notes (Signed)
Daily Session Note  Patient Details  Name: Jesse [name] MRN: [id number] Date of Birth: 12-27-1945 Referring Provider:   Flowsheet Row CARDIAC REHAB PHASE II ORIENTATION from 11/02/2020 in [place]  Referring Provider Dr. Roxy Manns       Encounter Date: 01/03/2021  Check In:  Session Check In - 01/03/21 1100       Check-In   Supervising physician immediately available to respond to emergencies [facility] MD immediately available    Physician(s) Dr. Harl Bowie    Location [facility]    Staff Present Aundra Dubin, RN, BSN;Other   Benay Pillow RN   Virtual Visit No    Medication changes reported     No    Fall or balance concerns reported    Yes    Comments Pt has only fallen once in the past year, but he reports that he gets off balance easily.    Tobacco Cessation No Change    Warm-up and Cool-down Performed as group-led instruction    Resistance Training Performed Yes    VAD Patient? No    PAD/SET Patient? No      Pain Assessment   Currently in Pain? No/denies    Multiple Pain Sites No             Capillary Blood Glucose: No results found for this or any previous visit (from the past 24 hour(s)).    Social History   Tobacco Use  Smoking Status Former   Packs/day: 1.50   Years: 15.00   Pack years: 22.50   Types: Cigarettes   Quit date: 05/13/1978   Years since quitting: 42.6  Smokeless Tobacco Never  Tobacco Comments   quit in the early 80's    Goals Met:  Independence with exercise equipment Exercise tolerated well No report of cardiac concerns or symptoms Strength training completed today  Goals Unmet:  Not Applicable  Comments: Check out 1200.   Dr. Kathie Dike is Medical Director for [facility] Pulmonary Rehab.

## 2021-01-05 ENCOUNTER — Encounter (HOSPITAL_COMMUNITY)
Admission: RE | Admit: 2021-01-05 | Discharge: 2021-01-05 | Disposition: A | Discharge status: Home / Self Care | Payer: PPO | Source: Ambulatory Visit | Attending: Thoracic Surgery (Cardiothoracic Vascular Surgery) | Admitting: Thoracic Surgery (Cardiothoracic Vascular Surgery)

## 2021-01-05 ENCOUNTER — Other Ambulatory Visit: Payer: Self-pay

## 2021-01-05 DIAGNOSIS — Z9889 Other specified postprocedural states: Secondary | ICD-10-CM | POA: Diagnosis not present

## 2021-01-05 NOTE — Progress Notes (Signed)
Daily Session Note  Patient Details  Name: Jesse [name] MRN: [id number] Date of Birth: 1945/08/11 Referring Provider:   Flowsheet Row CARDIAC REHAB PHASE II ORIENTATION from 11/02/2020 in [place]  Referring Provider Dr. Roxy Manns       Encounter Date: 01/05/2021  Check In:  Session Check In - 01/05/21 1100       Check-In   Supervising physician immediately available to respond to emergencies [facility] MD immediately available    Physician(s) Dr. Harl Bowie    Location [facility]    Staff Present Aundra Dubin, RN, BSN;Other    Virtual Visit No    Medication changes reported     No    Fall or balance concerns reported    Yes    Comments Pt has only fallen once in the past year, but he reports that he gets off balance easily.    Tobacco Cessation No Change    Warm-up and Cool-down Performed as group-led instruction    Resistance Training Performed Yes    VAD Patient? No    PAD/SET Patient? No      Pain Assessment   Currently in Pain? No/denies    Multiple Pain Sites No             Capillary Blood Glucose: No results found for this or any previous visit (from the past 24 hour(s)).    Social History   Tobacco Use  Smoking Status Former   Packs/day: 1.50   Years: 15.00   Pack years: 22.50   Types: Cigarettes   Quit date: 05/13/1978   Years since quitting: 42.6  Smokeless Tobacco Never  Tobacco Comments   quit in the early 80's    Goals Met:  Independence with exercise equipment Exercise tolerated well No report of concerns or symptoms today Strength training completed today  Goals Unmet:  Not Applicable  Comments: Check out 1200.   Dr. Kathie Dike is Medical Director for [facility] Pulmonary Rehab.

## 2021-01-06 ENCOUNTER — Ambulatory Visit (INDEPENDENT_AMBULATORY_CARE_PROVIDER_SITE_OTHER): Payer: PPO
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
## 2021-01-08 ENCOUNTER — Telehealth: Payer: Self-pay | Admitting: *Deleted

## 2021-01-08 ENCOUNTER — Encounter (HOSPITAL_COMMUNITY): Payer: PPO

## 2021-01-08 NOTE — Telephone Encounter (Signed)
See appointment notes.

## 2021-01-08 NOTE — Telephone Encounter (Signed)
PLEASE NOTE: All timestamps contained within this report are represented as [place] Standard Time. CONFIDENTIALTY NOTICE: This fax transmission is intended only for the addressee. It contains information that is legally privileged, confidential or otherwise protected from use or disclosure. If you are not the intended recipient, you are strictly prohibited from reviewing, disclosing, copying using or disseminating any of this information or taking any action in reliance on or regarding this information. If you have received this fax in error, please notify us immediately by telephone so that we can arrange for its return to [place]. Phone: [phone], Toll-Free: [phone], Fax: [phone] Page: 1 of 1 Call Id: [id number] [place] [facility] - Client Nonclinical Telephone Record  [facility] Client [place] [facility] - Client Client Site [place] Physician Ria Bush - MD Contact Type Call Who Is Calling Patient / Member / Family / Caregiver Caller Name Jesse [name] Caller Phone Number [phone] Patient Name Jesse [name] Patient DOB February 09, 1946 Call Type Message Only Information Provided Reason for Call Request to [facility] Appointment Initial Comment Caller says that he had a message on his chart that he has an appt. today Saturday but he does not make an appt. Patient request to speak to RN No Disp. Time Disposition Final User 01/06/2021 9:20:04 AM General Information Provided Yes Norton Blizzard Call Closed By: Norton Blizzard Transaction Date/Time: 01/06/2021 9:16:11 AM (ET)

## 2021-01-09 ENCOUNTER — Telehealth: Payer: Self-pay

## 2021-01-09 NOTE — Telephone Encounter (Signed)
Called patient and spoke with him about completing his annual medicare wellness. He DECLINED visit.  Clista Bernhardt, CMA (AAMA)

## 2021-01-10 ENCOUNTER — Other Ambulatory Visit: Payer: Self-pay

## 2021-01-10 ENCOUNTER — Encounter (HOSPITAL_COMMUNITY): Admission: RE | Admit: 2021-01-10 | Payer: PPO | Source: Ambulatory Visit

## 2021-01-12 ENCOUNTER — Encounter (HOSPITAL_COMMUNITY): Payer: PPO

## 2021-01-12 NOTE — Progress Notes (Signed)
Patient declined visit.

## 2021-01-15 ENCOUNTER — Encounter (HOSPITAL_COMMUNITY): Payer: PPO

## 2021-01-17 ENCOUNTER — Other Ambulatory Visit: Payer: Self-pay

## 2021-01-17 ENCOUNTER — Encounter (HOSPITAL_COMMUNITY)
Admission: RE | Admit: 2021-01-17 | Discharge: 2021-01-17 | Disposition: A | Discharge status: Home / Self Care | Payer: PPO | Source: Ambulatory Visit | Attending: Thoracic Surgery (Cardiothoracic Vascular Surgery) | Admitting: Thoracic Surgery (Cardiothoracic Vascular Surgery)

## 2021-01-17 DIAGNOSIS — Z9889 Other specified postprocedural states: Secondary | ICD-10-CM | POA: Diagnosis not present

## 2021-01-17 NOTE — Progress Notes (Signed)
Daily Session Note  Patient Details  Name: Jesse [name] MRN: [id number] Date of Birth: [birth year] Referring Provider:   Flowsheet Row CARDIAC REHAB PHASE II ORIENTATION from 11/02/2020 in [place]  Referring Provider Dr. Roxy Manns       Encounter Date: 01/17/2021  Check In:  Session Check In - 01/17/21 1045       Check-In   Supervising physician immediately available to respond to emergencies [facility] MD immediately available    Physician(s) Dr. Harl Bowie    Location [facility]    Staff Present Hoy Register, MS, ACSM-CEP, Exercise Physiologist;Debra Wynetta Emery, RN, BSN    Virtual Visit No    Medication changes reported     No    Fall or balance concerns reported    Yes    Comments Pt has only fallen once in the past year, but he reports that he gets off balance easily.    Tobacco Cessation No Change    Warm-up and Cool-down Performed as group-led instruction    Resistance Training Performed Yes    VAD Patient? No    PAD/SET Patient? No      Pain Assessment   Currently in Pain? No/denies    Multiple Pain Sites No             Capillary Blood Glucose: No results found for this or any previous visit (from the past 24 hour(s)).    Social History   Tobacco Use  Smoking Status Former   Packs/day: 1.50   Years: 15.00   Pack years: 22.50   Types: Cigarettes   Quit date: 05/13/1978   Years since quitting: 42.7  Smokeless Tobacco Never  Tobacco Comments   quit in the early 80's    Goals Met:  Independence with exercise equipment Exercise tolerated well No report of concerns or symptoms today Strength training completed today  Goals Unmet:  Not Applicable  Comments: checkout time is 1200   Dr. Kathie Dike is Medical Director for [facility] Pulmonary Rehab.

## 2021-01-19 ENCOUNTER — Other Ambulatory Visit: Payer: Self-pay

## 2021-01-19 ENCOUNTER — Encounter (HOSPITAL_COMMUNITY)
Admission: RE | Admit: 2021-01-19 | Discharge: 2021-01-19 | Disposition: A | Discharge status: Home / Self Care | Payer: PPO | Source: Ambulatory Visit | Attending: Thoracic Surgery (Cardiothoracic Vascular Surgery) | Admitting: Thoracic Surgery (Cardiothoracic Vascular Surgery)

## 2021-01-19 DIAGNOSIS — Z9889 Other specified postprocedural states: Secondary | ICD-10-CM

## 2021-01-19 NOTE — Progress Notes (Signed)
Daily Session Note  Patient Details  Name: Jesse [name] MRN: [id number] Date of Birth: 12-Aug-1945 Referring Provider:   Flowsheet Row CARDIAC REHAB PHASE II ORIENTATION from 11/02/2020 in [place]  Referring Provider Dr. Roxy Manns       Encounter Date: 01/19/2021  Check In:  Session Check In - 01/19/21 1059       Check-In   Supervising physician immediately available to respond to emergencies [facility] MD immediately available    Physician(s) Dr. Harl Bowie    Location [facility]    Staff Present Aundra Dubin, RN, BSN;Phyllis Billingsley, RN    Virtual Visit No    Medication changes reported     No    Fall or balance concerns reported    Yes    Comments Pt has only fallen once in the past year, but he reports that he gets off balance easily.    Tobacco Cessation No Change    Warm-up and Cool-down Performed as group-led instruction    Resistance Training Performed Yes    VAD Patient? No    PAD/SET Patient? No      Pain Assessment   Currently in Pain? No/denies    Multiple Pain Sites No             Capillary Blood Glucose: No results found for this or any previous visit (from the past 24 hour(s)).    Social History   Tobacco Use  Smoking Status Former   Packs/day: 1.50   Years: 15.00   Pack years: 22.50   Types: Cigarettes   Quit date: 05/13/1978   Years since quitting: 42.7  Smokeless Tobacco Never  Tobacco Comments   quit in the early 80's    Goals Met:  Independence with exercise equipment Exercise tolerated well No report of concerns or symptoms today Strength training completed today  Goals Unmet:  Not Applicable  Comments: Check out 1200.   Dr. Kathie Dike is Medical Director for [facility] Pulmonary Rehab.

## 2021-01-22 ENCOUNTER — Other Ambulatory Visit: Payer: Self-pay

## 2021-01-22 ENCOUNTER — Encounter (HOSPITAL_COMMUNITY)
Admission: RE | Admit: 2021-01-22 | Discharge: 2021-01-22 | Disposition: A | Discharge status: Home / Self Care | Payer: PPO | Source: Ambulatory Visit | Attending: Thoracic Surgery (Cardiothoracic Vascular Surgery) | Admitting: Thoracic Surgery (Cardiothoracic Vascular Surgery)

## 2021-01-22 VITALS — Wt 207.2 lb

## 2021-01-22 DIAGNOSIS — Z9889 Other specified postprocedural states: Secondary | ICD-10-CM

## 2021-01-22 NOTE — Progress Notes (Signed)
Daily Session Note  Patient Details  Name: Jesse [name] MRN: [id number] Date of Birth: 1946-03-29 Referring Provider:   Flowsheet Row CARDIAC REHAB PHASE II ORIENTATION from 11/02/2020 in [place]  Referring Provider Dr. Roxy Manns       Encounter Date: 01/22/2021  Check In:  Session Check In - 01/22/21 1058       Check-In   Supervising physician immediately available to respond to emergencies [facility] MD immediately available    Physician(s) Domenic Polite    Location [facility]    Staff Present Geanie Cooley, Kermit Balo, RN, BSN    Virtual Visit No    Medication changes reported     No    Fall or balance concerns reported    Yes    Comments Pt has only fallen once in the past year, but he reports that he gets off balance easily.    Tobacco Cessation No Change    Warm-up and Cool-down Performed as group-led instruction    Resistance Training Performed Yes    VAD Patient? No    PAD/SET Patient? No      Pain Assessment   Currently in Pain? No/denies    Multiple Pain Sites No             Capillary Blood Glucose: No results found for this or any previous visit (from the past 24 hour(s)).    Social History   Tobacco Use  Smoking Status Former   Packs/day: 1.50   Years: 15.00   Pack years: 22.50   Types: Cigarettes   Quit date: 05/13/1978   Years since quitting: 42.7  Smokeless Tobacco Never  Tobacco Comments   quit in the early 80's    Goals Met:  Independence with exercise equipment Exercise tolerated well No report of concerns or symptoms today Strength training completed today  Goals Unmet:  Not Applicable  Comments: Check out 1200.   Dr. Kathie Dike is Medical Director for [facility] Pulmonary Rehab.

## 2021-01-24 ENCOUNTER — Other Ambulatory Visit: Payer: Self-pay

## 2021-01-24 ENCOUNTER — Encounter (HOSPITAL_COMMUNITY)
Admission: RE | Admit: 2021-01-24 | Discharge: 2021-01-24 | Disposition: A | Discharge status: Home / Self Care | Payer: PPO | Source: Ambulatory Visit | Attending: Thoracic Surgery (Cardiothoracic Vascular Surgery) | Admitting: Thoracic Surgery (Cardiothoracic Vascular Surgery)

## 2021-01-24 DIAGNOSIS — Z9889 Other specified postprocedural states: Secondary | ICD-10-CM

## 2021-01-24 NOTE — Progress Notes (Signed)
Daily Session Note  Patient Details  Name: Jesse [name] MRN: [id number] Date of Birth: 08-30-1945 Referring Provider:   Flowsheet Row CARDIAC REHAB PHASE II ORIENTATION from 11/02/2020 in [place]  Referring Provider Dr. Roxy Manns       Encounter Date: 01/24/2021  Check In:  Session Check In - 01/24/21 1100       Check-In   Supervising physician immediately available to respond to emergencies [facility] MD immediately available    Physician(s) Dr. Domenic Polite    Location [facility]    Staff Present Hoy Register, MS, ACSM-CEP, Exercise Physiologist;Debra Wynetta Emery, RN, BSN;Other    Virtual Visit No    Medication changes reported     No    Fall or balance concerns reported    Yes    Tobacco Cessation No Change    Warm-up and Cool-down Performed as group-led instruction    Resistance Training Performed Yes    VAD Patient? No    PAD/SET Patient? No      Pain Assessment   Currently in Pain? No/denies    Multiple Pain Sites No             Capillary Blood Glucose: No results found for this or any previous visit (from the past 24 hour(s)).    Social History   Tobacco Use  Smoking Status Former   Packs/day: 1.50   Years: 15.00   Pack years: 22.50   Types: Cigarettes   Quit date: 05/13/1978   Years since quitting: 42.7  Smokeless Tobacco Never  Tobacco Comments   quit in the early 80's    Goals Met:  Independence with exercise equipment Exercise tolerated well No report of concerns or symptoms today Strength training completed today  Goals Unmet:  Not Applicable  Comments: checkout time is 1200   Dr. Kathie Dike is Medical Director for [facility] Pulmonary Rehab.

## 2021-01-24 NOTE — Progress Notes (Signed)
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 presence or absence of significant depression and/or stress, maximize coping skills, provide positive support system. Participant is able to verbalize types and ability to use techniques and skills needed for reducing stress and depression.  Initial Review & Psychosocial Screening:  Initial Psych Review & Screening - 11/02/20 0820       Initial Review   Current issues with None Identified      Family Dynamics   Good Support System? Yes    Comments His wife is his main support system. His two children live near by and support him as well.      Barriers   Psychosocial barriers to participate in program There are no identifiable barriers or psychosocial needs.      Screening Interventions   Interventions Encouraged to exercise    Expected Outcomes Long Term goal: The participant improves quality of Life and PHQ9 Scores as seen by post scores  and/or verbalization of changes             Quality of Life Scores:  Quality of Life - 11/02/20 1000       Quality of Life   Select Quality of Life      Quality of Life Scores   Health/Function Pre 19.1 %    Socioeconomic Pre 22.58 %    Psych/Spiritual Pre 21.42 %    Family Pre 18 %    GLOBAL Pre 20.02 %            Scores of 19 and below usually indicate a poorer quality of life in these areas.  A difference of  2-3 points is a clinically meaningful difference.  A difference of 2-3 points in the total score of the Quality of Life Index has been associated with significant improvement in overall quality of life, self-image, physical symptoms, and general health in studies assessing change in quality of life.  PHQ-9: Recent Review Flowsheet Data     Depression screen [facility] 2/9 11/02/2020 04/03/2016 09/23/2011   Decreased Interest 2 0 0   Down, Depressed, Hopeless 0 0 0   PHQ - 2 Score 2 0 0   Altered sleeping 1 - -   Tired, decreased energy 3 - -   Change in appetite 2 - -   Feeling bad or failure  about yourself  0 - -   Trouble concentrating 0 - -   Moving slowly or fidgety/restless 0 - -   Suicidal thoughts 0 - -   PHQ-9 Score 8 - -   Difficult doing work/chores Somewhat difficult - -      Interpretation of Total Score  Total Score Depression Severity:  1-4 = Minimal depression, 5-9 = Mild depression, 10-14 = Moderate depression, 15-19 = Moderately severe depression, 20-27 = Severe depression   Psychosocial Evaluation and Intervention:  Psychosocial Evaluation - 11/02/20 0944       Psychosocial Evaluation & Interventions   Interventions Encouraged to exercise with the program and follow exercise prescription    Comments Pt has no identifiable barriers to completing rehab. He has no identifiable psychosocial issues. He scored an 8 on his PHQ-9. Most of this is due to the fatigue that he has experiences since his mitral valve repair. He has a positive outlook and a  good support system with his wife and two children. His goals for the program are to imporve his balance and to get stronger. He is eager to start the program.    Expected Outcomes The patient will continue to not have any identifiable psychosocial issues.    Continue Psychosocial Services  No Follow up required             Psychosocial Re-Evaluation:  Psychosocial Re-Evaluation     [place] Name 11/20/20 0739 12/18/20 0732 01/17/21 0745         Psychosocial Re-Evaluation   Current issues with None Identified None Identified None Identified     Comments Paitent is new to the program completing 6 sesisons. He continues to have no psychosocial issues identified. He is quite but is HOH. He seems to enjoy coming to the sessions and demonstrates an interest in improving his health. We will continue to monitor for progress. Patient continues to have no psychosocial barriers identified. He has completed 18 sessions and is doing well in the program. He continues to enjoy coming to the program and demonstrates an interest in improving his health. We will continue to monitor his progress. Patient continues to have no psychosocial barriers identified. He has completed 27 sessions and continues to do well in the program. He continues to enjoy coming to the program and demonstrates an interest in improving his health. We will continue to monitor his progress.     Expected Outcomes Patient will continue to have no psychosocial issues identifie.d Patient will continue to have no psychosocial issues identified. Patient will continue to have no psychosocial issues identified.     Interventions Stress management education;Relaxation education;Encouraged to attend Cardiac Rehabilitation for the exercise Stress management education;Relaxation education;Encouraged to attend Cardiac Rehabilitation for the exercise Stress management education;Relaxation education;Encouraged to attend Cardiac Rehabilitation for the exercise      Continue Psychosocial Services  No Follow up required No Follow up required No Follow up required              Psychosocial Discharge (Final Psychosocial Re-Evaluation):  Psychosocial Re-Evaluation - 01/17/21 0745       Psychosocial Re-Evaluation   Current issues with None Identified    Comments Patient continues to have no psychosocial barriers identified. He has completed 27 sessions and continues to do well in the program. He continues to enjoy coming to the program and demonstrates an interest in improving his health. We will continue to monitor his progress.    Expected Outcomes Patient will continue to have  no psychosocial issues identified.    Interventions Stress management education;Relaxation education;Encouraged to attend Cardiac Rehabilitation for the exercise    Continue Psychosocial Services  No Follow up required             Vocational Rehabilitation: Provide vocational rehab assistance to qualifying candidates.   Vocational Rehab Evaluation & Intervention:  Vocational Rehab - 11/02/20 0858       Initial Vocational Rehab Evaluation & Intervention   Assessment shows need for Vocational Rehabilitation No             Education: Education Goals: Education classes will be provided on a weekly basis, covering required topics. Participant will state understanding/return demonstration of topics presented.  Learning Barriers/Preferences:  Learning Barriers/Preferences - 11/02/20 0855       Learning Barriers/Preferences   Learning Barriers Hearing    Learning Preferences Individual Instruction;Skilled Demonstration             Education Topics: Hypertension, Hypertension Reduction -Define heart disease and high blood pressure. Discus how high blood pressure affects the body and ways to reduce high blood pressure. Flowsheet Row CARDIAC REHAB PHASE II EXERCISE from 01/17/2021 in [place]  Date 11/15/20  Educator DJ   Instruction Review Code 1- Verbalizes Understanding       Exercise and Your Heart -Discuss why it is important to exercise, the FITT principles of exercise, normal and abnormal responses to exercise, and how to exercise safely. Flowsheet Row CARDIAC REHAB PHASE II EXERCISE from 01/17/2021 in [place]  Date 11/22/20  Educator DJ  Instruction Review Code 1- Verbalizes Understanding       Angina -Discuss definition of angina, causes of angina, treatment of angina, and how to decrease risk of having angina. Flowsheet Row CARDIAC REHAB PHASE II EXERCISE from 01/17/2021 in [place]  Date 11/29/20  Educator DF  Instruction Review Code 2- Demonstrated Understanding       Cardiac Medications -Review what the following cardiac medications are used for, how they affect the body, and side effects that may occur when taking the medications.  Medications include Aspirin, Beta blockers, calcium channel blockers, ACE Inhibitors, angiotensin receptor blockers, diuretics, digoxin, and antihyperlipidemics. Flowsheet Row CARDIAC REHAB PHASE II EXERCISE from 01/17/2021 in [place]  Date 12/06/20  Educator pb  Instruction Review Code 1- Verbalizes Understanding       Congestive Heart Failure -Discuss the definition of CHF, how to live with CHF, the signs and symptoms of CHF, and how keep track of weight and sodium intake. Flowsheet Row CARDIAC REHAB PHASE II EXERCISE from 01/17/2021 in [place]  Date 12/13/20  Educator DF  Instruction Review Code 2- Demonstrated Understanding       Heart Disease and Intimacy -Discus the effect sexual activity has on the heart, how changes occur during intimacy as we age, and safety during sexual activity. Flowsheet Row CARDIAC REHAB PHASE II EXERCISE from 01/17/2021 in [place]  Date 12/20/20  Educator DF  Instruction Review Code 2-  Demonstrated Understanding       Smoking Cessation / COPD -Discuss different methods to quit smoking, the health benefits of quitting smoking, and the definition of COPD. Flowsheet Row CARDIAC REHAB PHASE II EXERCISE from 01/17/2021 in [place]  Date 12/27/20  Educator DJ  Instruction Review Code 1- Verbalizes Understanding       Nutrition I: Fats -Discuss the types of cholesterol, what cholesterol does to the  heart, and how cholesterol levels can be controlled. Flowsheet Row CARDIAC REHAB PHASE II EXERCISE from 01/17/2021 in [place]  Date 01/03/21  Educator DJ  Instruction Review Code 1- Verbalizes Understanding       Nutrition II: Labels -Discuss the different components of food labels and how to read food label [place] from 01/17/2021 in [place]  Date 01/17/21  Educator DF  Instruction Review Code 2- Demonstrated Understanding       Heart Parts/Heart Disease and PAD -Discuss the anatomy of the heart, the pathway of blood circulation through the heart, and these are affected by heart disease.   Stress I: Signs and Symptoms -Discuss the causes of stress, how stress may lead to anxiety and depression, and ways to limit stress.   Stress II: Relaxation -Discuss different types of relaxation techniques to limit stress.   Warning Signs of Stroke / TIA -Discuss definition of a stroke, what the signs and symptoms are of a stroke, and how to identify when someone is having stroke. Flowsheet Row CARDIAC REHAB PHASE II EXERCISE from 01/17/2021 in [place]  Date 11/08/20  Educator DJ  Instruction Review Code 1- Verbalizes Understanding       Knowledge Questionnaire Score:  Knowledge Questionnaire Score - 11/02/20 0856       Knowledge Questionnaire Score   Pre Score 20/24             Core Components/Risk Factors/Patient Goals at  Admission:  Personal Goals and Risk Factors at Admission - 11/02/20 0858       Core Components/Risk Factors/Patient Goals on Admission    Weight Management Yes;Weight Loss    Intervention Weight Management: Develop a combined nutrition and exercise program designed to reach desired caloric intake, while maintaining appropriate intake of nutrient and fiber, sodium and fats, and appropriate energy expenditure required for the weight goal.;Weight Management: Provide education and appropriate resources to help participant work on and attain dietary goals.;Weight Management/Obesity: Establish reasonable short term and long term weight goals.;Obesity: Provide education and appropriate resources to help participant work on and attain dietary goals.    Expected Outcomes Short Term: Continue to assess and modify interventions until short term weight is achieved;Long Term: Adherence to nutrition and physical activity/exercise program aimed toward attainment of established weight goal;Weight Maintenance: Understanding of the daily nutrition guidelines, which includes 25-35% calories from fat, 7% or less cal from saturated fats, less than '200mg'$  cholesterol, less than 1.5gm of sodium, & 5 or more servings of fruits and vegetables daily;Weight Loss: Understanding of general recommendations for a balanced deficit meal plan, which promotes 1-2 lb weight loss per week and includes a negative energy balance of [phone] kcal/d;Understanding recommendations for meals to include 15-35% energy as protein, 25-35% energy from fat, 35-60% energy from carbohydrates, less than '200mg'$  of dietary cholesterol, 20-35 gm of total fiber daily;Understanding of distribution of calorie intake throughout the day with the consumption of 4-5 meals/snacks    Improve shortness of breath with ADL's Yes    Intervention Provide education, individualized exercise plan and daily activity instruction to help decrease symptoms of SOB with activities of  daily living.    Expected Outcomes Short Term: Improve cardiorespiratory fitness to achieve a reduction of symptoms when performing ADLs;Long Term: Be able to perform more ADLs without symptoms or delay the onset of symptoms    Hypertension Yes    Intervention Provide education on lifestyle modifcations including regular physical  activity/exercise, weight management, moderate sodium restriction and increased consumption of fresh fruit, vegetables, and low fat dairy, alcohol moderation, and smoking cessation.;Monitor prescription use compliance.    Expected Outcomes Short Term: Continued assessment and intervention until BP is < 140/41m HG in hypertensive participants. < 130/824mHG in hypertensive participants with diabetes, heart failure or chronic kidney disease.;Long Term: Maintenance of blood pressure at goal levels.    Lipids Yes    Intervention Provide education and support for participant on nutrition & aerobic/resistive exercise along with prescribed medications to achieve LDL '70mg'$ , HDL >'40mg'$ .    Expected Outcomes Short Term: Participant states understanding of desired cholesterol values and is compliant with medications prescribed. Participant is following exercise prescription and nutrition guidelines.;Long Term: Cholesterol controlled with medications as prescribed, with individualized exercise RX and with personalized nutrition plan. Value goals: LDL < '70mg'$ , HDL > 40 mg.             Core Components/Risk Factors/Patient Goals Review:   Goals and Risk Factor Review     Row Name 11/20/20 0737 12/18/20 0734 01/17/21 0745         Core Components/Risk Factors/Patient Goals Review   Personal Goals Review Other Other Other     Review Paitent referred to CR with S/P Mitral valve repair. He has completed 6 sessions. He does have multiple risk factors for CAD and is participating in the program for risk modification. His personal goals for the program are to improve his balance and get  stronger. His blood pressure has been elevated at times with his initial reading. We will continue to monitor his progress as he works towards meeting these goals. Patient has completed 18 sessions losing 2 lbs since last 30 day review. He is doing well in the program with consistent attendance and progressions. His blood has been elevated at times above goal 140 to 150/80 but has improved in the past few weeks. We will continue to monitor. His personal goals for the program are to improve his balance and get stronger. We will continue to monitor his progress as he works towards meeting these goals. Patient has completed 27 sessions gaining 1 lb since last 30 day review. He continues to do very well in the program with conssitent attendance and progressions. His initial blood pressure continues to be elevated but comes down during exercise and post exercise averaging 100/70 post exercise. His balance has improved some. His personal goals for the program are to improve his balance and get stronger. We will continue to monitor his progress as he works towards meeting these goals.     Expected Outcomes Patient will complete the program meeting both personal and program goals. Patient will complete the program meeting both personal and program goals. Patient will complete the program meeting both personal and program goals.              Core Components/Risk Factors/Patient Goals at Discharge (Final Review):   Goals and Risk Factor Review - 01/17/21 0745       Core Components/Risk Factors/Patient Goals Review   Personal Goals Review Other    Review Patient has completed 27 sessions gaining 1 lb since last 30 day review. He continues to do very well in the program with conssitent attendance and progressions. His initial blood pressure continues to be elevated but comes down during exercise and post exercise averaging 100/70 post exercise. His balance has improved some. His personal goals for the program are  to improve his balance and get stronger.  We will continue to monitor his progress as he works towards meeting these goals.    Expected Outcomes Patient will complete the program meeting both personal and program goals.             ITP Comments:   Comments: ITP REVIEW Pt is making expected progress toward Cardiac Rehab goals after completing 30 sessions. Recommend continued exercise, life style modification, education, and increased stamina and strength.

## 2021-01-26 ENCOUNTER — Encounter (HOSPITAL_COMMUNITY)
Admission: RE | Admit: 2021-01-26 | Discharge: 2021-01-26 | Disposition: A | Discharge status: Home / Self Care | Payer: PPO | Source: Ambulatory Visit | Attending: Thoracic Surgery (Cardiothoracic Vascular Surgery) | Admitting: Thoracic Surgery (Cardiothoracic Vascular Surgery)

## 2021-01-26 ENCOUNTER — Other Ambulatory Visit: Payer: Self-pay

## 2021-01-26 VITALS — Ht 70.0 in | Wt 209.2 lb

## 2021-01-26 DIAGNOSIS — Z9889 Other specified postprocedural states: Secondary | ICD-10-CM

## 2021-01-26 NOTE — Progress Notes (Signed)
Daily Session Note  Patient Details  Name: Jesse [name] MRN: [id number] Date of Birth: Nov 16, 1945 Referring Provider:   Flowsheet Row CARDIAC REHAB PHASE II ORIENTATION from 11/02/2020 in [place]  Referring Provider Dr. Roxy Manns       Encounter Date: 01/26/2021  Check In:  Session Check In - 01/26/21 1100       Check-In   Supervising physician immediately available to respond to emergencies [facility] MD immediately available    Physician(s) Dr. Harrington Challenger    Location [facility]    Staff Present Aundra Dubin, RN, BSN;Other;Dalton Fletcher, MS, ACSM-CEP, Exercise Physiologist    Virtual Visit No    Medication changes reported     No    Fall or balance concerns reported    No    Tobacco Cessation No Change    Warm-up and Cool-down Performed as group-led instruction    Resistance Training Performed Yes    VAD Patient? No    PAD/SET Patient? No      Pain Assessment   Currently in Pain? No/denies    Multiple Pain Sites No             Capillary Blood Glucose: No results found for this or any previous visit (from the past 24 hour(s)).    Social History   Tobacco Use  Smoking Status Former   Packs/day: 1.50   Years: 15.00   Pack years: 22.50   Types: Cigarettes   Quit date: 05/13/1978   Years since quitting: 42.7  Smokeless Tobacco Never  Tobacco Comments   quit in the early 80's    Goals Met:  Independence with exercise equipment Exercise tolerated well No report of concerns or symptoms today Strength training completed today  Goals Unmet:  Not Applicable  Comments: check out 1200   Dr. Kathie Dike is Medical Director for [facility].

## 2021-02-06 NOTE — Progress Notes (Signed)
Discharge Progress Report  Patient Details  Name: Jesse [name] MRN: [id number] Date of Birth: Jun 09, 1945 Referring Provider:   Flowsheet Row CARDIAC REHAB PHASE II ORIENTATION from 11/02/2020 in [place]  Referring Provider Dr. Roxy Manns        Number of Visits: 32  Reason for Discharge:  Patient reached a stable level of exercise. Patient independent in their exercise. Patient has met program and personal goals.  Smoking History:  Social History   Tobacco Use  Smoking Status Former   Packs/day: 1.50   Years: 15.00   Pack years: 22.50   Types: Cigarettes   Quit date: 05/13/1978   Years since quitting: 42.7  Smokeless Tobacco Never  Tobacco Comments   quit in the early 80's    Diagnosis:  S/P mitral valve repair  ADL [facility]:   Initial Exercise Prescription:  Initial Exercise Prescription - 11/02/20 1000       Date of Initial Exercise RX and Referring Provider   Date 11/02/20    Referring Provider Dr. Roxy Manns    Expected Discharge Date 01/26/21      NuStep   Level 1    SPM 80    Minutes 17      Arm Ergometer   Level 1    RPM 60    Minutes 22      Prescription Details   Frequency (times per week) 3    Duration Progress to 30 minutes of continuous aerobic without signs/symptoms of physical distress      Intensity   THRR 40-80% of Max Heartrate 58-116    Ratings of Perceived Exertion 11-13    Perceived Dyspnea 0-4      Resistance Training   Training Prescription Yes    Weight 3 lbs    Reps 10-15             Discharge Exercise Prescription (Final Exercise Prescription Changes):  Exercise Prescription Changes - 01/22/21 1100       Response to Exercise   Blood Pressure (Admit) 126/70    Blood Pressure (Exercise) 122/62    Blood Pressure (Exit) 88/50    Heart Rate (Admit) 76 bpm    Heart Rate (Exercise) 86 bpm    Heart Rate (Exit) 80 bpm    Rating of Perceived Exertion (Exercise) 13    Duration Continue with 30 min of  aerobic exercise without signs/symptoms of physical distress.    Intensity THRR unchanged      Progression   Progression Continue to progress workloads to maintain intensity without signs/symptoms of physical distress.      Resistance Training   Training Prescription Yes    Weight 4 lbs    Reps 10-15    Time 10 Minutes      NuStep   Level 3    SPM 118    Minutes 17    METs 2.76      Arm Ergometer   Level 3    RPM 56    Minutes 22    METs 2.61             Functional Capacity:  6 Minute Walk     Row Name 11/02/20 1005 01/26/21 1200       6 Minute Walk   Phase Initial Discharge    Distance 1250 feet 1325 feet    Distance Feet Change -- 75 ft    Walk Time 6 minutes 6 minutes    # of Rest Breaks 0 0  MPH 2.37 2.61    METS 2.7 2.62    RPE 12 12    VO2 Peak 9.46 9.16    Symptoms No No    Resting HR 84 bpm 71 bpm    Resting BP 128/88 132/70    Resting Oxygen Saturation  98 % 99 %    Exercise Oxygen Saturation  during 6 min walk 97 % --    Max Ex. HR 109 bpm 99 bpm    Max Ex. BP 158/94 140/88    2 Minute Post BP 140/94 136/90             Psychological, QOL, Others - Outcomes: PHQ 2/9: Depression screen [facility] 2/9 02/06/2021 11/02/2020 04/03/2016 09/23/2011  Decreased Interest 1 2 0 0  Down, Depressed, Hopeless 1 0 0 0  PHQ - 2 Score 2 2 0 0  Altered sleeping 0 1 - -  Tired, decreased energy 3 3 - -  Change in appetite 1 2 - -  Feeling bad or failure about yourself  0 0 - -  Trouble concentrating 0 0 - -  Moving slowly or fidgety/restless 0 0 - -  Suicidal thoughts 0 0 - -  PHQ-9 Score 6 8 - -  Difficult doing work/chores Not difficult at all Somewhat difficult - -  Some recent data might be hidden    Quality of Life:  Quality of Life - 02/06/21 0918       Quality of Life Scores   Health/Function Pre 19.1 %    Health/Function Post 21.2 %    Health/Function % Change 10.99 %    Socioeconomic Pre 22.58 %    Socioeconomic Post 24.86 %     Socioeconomic % Change  10.1 %    Psych/Spiritual Pre 21.42 %    Psych/Spiritual Post 24 %    Psych/Spiritual % Change 12.04 %    Family Pre 18 %    Family Post 22.8 %    Family % Change 26.67 %    GLOBAL Pre 20.02 %    GLOBAL Post 22.76 %    GLOBAL % Change 13.69 %             Personal Goals: Goals established at orientation with interventions provided to work toward goal.  Personal Goals and Risk Factors at Admission - 11/02/20 0858       Core Components/Risk Factors/Patient Goals on Admission    Weight Management Yes;Weight Loss    Intervention Weight Management: Develop a combined nutrition and exercise program designed to reach desired caloric intake, while maintaining appropriate intake of nutrient and fiber, sodium and fats, and appropriate energy expenditure required for the weight goal.;Weight Management: Provide education and appropriate resources to help participant work on and attain dietary goals.;Weight Management/Obesity: Establish reasonable short term and long term weight goals.;Obesity: Provide education and appropriate resources to help participant work on and attain dietary goals.    Expected Outcomes Short Term: Continue to assess and modify interventions until short term weight is achieved;Long Term: Adherence to nutrition and physical activity/exercise program aimed toward attainment of established weight goal;Weight Maintenance: Understanding of the daily nutrition guidelines, which includes 25-35% calories from fat, 7% or less cal from saturated fats, less than 258m cholesterol, less than 1.5gm of sodium, & 5 or more servings of fruits and vegetables daily;Weight Loss: Understanding of general recommendations for a balanced deficit meal plan, which promotes 1-2 lb weight loss per week and includes a negative energy balance of [phone] kcal/d;Understanding recommendations  for meals to include 15-35% energy as protein, 25-35% energy from fat, 35-60% energy from  carbohydrates, less than 230m of dietary cholesterol, 20-35 gm of total fiber daily;Understanding of distribution of calorie intake throughout the day with the consumption of 4-5 meals/snacks    Improve shortness of breath with ADL's Yes    Intervention Provide education, individualized exercise plan and daily activity instruction to help decrease symptoms of SOB with activities of daily living.    Expected Outcomes Short Term: Improve cardiorespiratory fitness to achieve a reduction of symptoms when performing ADLs;Long Term: Be able to perform more ADLs without symptoms or delay the onset of symptoms    Hypertension Yes    Intervention Provide education on lifestyle modifcations including regular physical activity/exercise, weight management, moderate sodium restriction and increased consumption of fresh fruit, vegetables, and low fat dairy, alcohol moderation, and smoking cessation.;Monitor prescription use compliance.    Expected Outcomes Short Term: Continued assessment and intervention until BP is < 140/945mHG in hypertensive participants. < 130/8087mG in hypertensive participants with diabetes, heart failure or chronic kidney disease.;Long Term: Maintenance of blood pressure at goal levels.    Lipids Yes    Intervention Provide education and support for participant on nutrition & aerobic/resistive exercise along with prescribed medications to achieve LDL <40m5mDL >40mg42m Expected Outcomes Short Term: Participant states understanding of desired cholesterol values and is compliant with medications prescribed. Participant is following exercise prescription and nutrition guidelines.;Long Term: Cholesterol controlled with medications as prescribed, with individualized exercise RX and with personalized nutrition plan. Value goals: LDL < 40mg,33m > 40 mg.              Personal Goals Discharge:  Goals and Risk Factor Review     Row Name 11/20/20 0737 12/18/20 0734 01/17/21 0745 02/06/21  0920       Core Components/Risk Factors/Patient Goals Review   Personal Goals Review Other Other Other Other    Review Paitent referred to CR with S/P Mitral valve repair. He has completed 6 sessions. He does have multiple risk factors for CAD and is participating in the program for risk modification. His personal goals for the program are to improve his balance and get stronger. His blood pressure has been elevated at times with his initial reading. We will continue to monitor his progress as he works towards meeting these goals. Patient has completed 18 sessions losing 2 lbs since last 30 day review. He is doing well in the program with consistent attendance and progressions. His blood has been elevated at times above goal 140 to 150/80 but has improved in the past few weeks. We will continue to monitor. His personal goals for the program are to improve his balance and get stronger. We will continue to monitor his progress as he works towards meeting these goals. Patient has completed 27 sessions gaining 1 lb since last 30 day review. He continues to do very well in the program with conssitent attendance and progressions. His initial blood pressure continues to be elevated but comes down during exercise and post exercise averaging 100/70 post exercise. His balance has improved some. His personal goals for the program are to improve his balance and get stronger. We will continue to monitor his progress as he works towards meeting these goals. Pt graduated after completing 32 sessions. His weight was consistent along with his attendance. His blood pressure was elevated throughout the program upon arrival but would always drop after exercise. He routinely  complained of dizziness after exercise, and we encouraged him to speak to his cardiologist about this, although he never did. He was able to get stronger and his balance did improve some.    Expected Outcomes Patient will complete the program meeting both  personal and program goals. Patient will complete the program meeting both personal and program goals. Patient will complete the program meeting both personal and program goals. Patient will continue to work towards their goals post discharge.             Exercise Goals and Review:  Exercise Goals     Row Name 11/02/20 1007 11/27/20 1333 12/26/20 0842 01/23/21 0811       Exercise Goals   Increase Physical Activity Yes Yes Yes Yes    Intervention Provide advice, education, support and counseling about physical activity/exercise needs.;Develop an individualized exercise prescription for aerobic and resistive training based on initial evaluation findings, risk stratification, comorbidities and participant's personal goals. Provide advice, education, support and counseling about physical activity/exercise needs.;Develop an individualized exercise prescription for aerobic and resistive training based on initial evaluation findings, risk stratification, comorbidities and participant's personal goals. Provide advice, education, support and counseling about physical activity/exercise needs.;Develop an individualized exercise prescription for aerobic and resistive training based on initial evaluation findings, risk stratification, comorbidities and participant's personal goals. Provide advice, education, support and counseling about physical activity/exercise needs.;Develop an individualized exercise prescription for aerobic and resistive training based on initial evaluation findings, risk stratification, comorbidities and participant's personal goals.    Expected Outcomes Short Term: Attend rehab on a regular basis to increase amount of physical activity.;Long Term: Add in home exercise to make exercise part of routine and to increase amount of physical activity.;Long Term: Exercising regularly at least 3-5 days a week. Short Term: Attend rehab on a regular basis to increase amount of physical  activity.;Long Term: Add in home exercise to make exercise part of routine and to increase amount of physical activity.;Long Term: Exercising regularly at least 3-5 days a week. Short Term: Attend rehab on a regular basis to increase amount of physical activity.;Long Term: Add in home exercise to make exercise part of routine and to increase amount of physical activity.;Long Term: Exercising regularly at least 3-5 days a week. Short Term: Attend rehab on a regular basis to increase amount of physical activity.;Long Term: Add in home exercise to make exercise part of routine and to increase amount of physical activity.;Long Term: Exercising regularly at least 3-5 days a week.    Increase Strength and Stamina Yes Yes Yes Yes    Intervention Provide advice, education, support and counseling about physical activity/exercise needs.;Develop an individualized exercise prescription for aerobic and resistive training based on initial evaluation findings, risk stratification, comorbidities and participant's personal goals. Provide advice, education, support and counseling about physical activity/exercise needs.;Develop an individualized exercise prescription for aerobic and resistive training based on initial evaluation findings, risk stratification, comorbidities and participant's personal goals. Provide advice, education, support and counseling about physical activity/exercise needs.;Develop an individualized exercise prescription for aerobic and resistive training based on initial evaluation findings, risk stratification, comorbidities and participant's personal goals. Provide advice, education, support and counseling about physical activity/exercise needs.;Develop an individualized exercise prescription for aerobic and resistive training based on initial evaluation findings, risk stratification, comorbidities and participant's personal goals.    Expected Outcomes Short Term: Increase workloads from initial exercise  prescription for resistance, speed, and METs.;Short Term: Perform resistance training exercises routinely during rehab and add in resistance training  at home;Long Term: Improve cardiorespiratory fitness, muscular endurance and strength as measured by increased METs and functional capacity (6MWT) Short Term: Increase workloads from initial exercise prescription for resistance, speed, and METs.;Short Term: Perform resistance training exercises routinely during rehab and add in resistance training at home;Long Term: Improve cardiorespiratory fitness, muscular endurance and strength as measured by increased METs and functional capacity (6MWT) Short Term: Increase workloads from initial exercise prescription for resistance, speed, and METs.;Short Term: Perform resistance training exercises routinely during rehab and add in resistance training at home;Long Term: Improve cardiorespiratory fitness, muscular endurance and strength as measured by increased METs and functional capacity (6MWT) Short Term: Increase workloads from initial exercise prescription for resistance, speed, and METs.;Short Term: Perform resistance training exercises routinely during rehab and add in resistance training at home;Long Term: Improve cardiorespiratory fitness, muscular endurance and strength as measured by increased METs and functional capacity (6MWT)    Able to understand and use rate of perceived exertion (RPE) scale Yes Yes Yes Yes    Intervention Provide education and explanation on how to use RPE scale Provide education and explanation on how to use RPE scale Provide education and explanation on how to use RPE scale Provide education and explanation on how to use RPE scale    Expected Outcomes Short Term: Able to use RPE daily in rehab to express subjective intensity level;Long Term:  Able to use RPE to guide intensity level when exercising independently Short Term: Able to use RPE daily in rehab to express subjective intensity  level;Long Term:  Able to use RPE to guide intensity level when exercising independently Short Term: Able to use RPE daily in rehab to express subjective intensity level;Long Term:  Able to use RPE to guide intensity level when exercising independently Short Term: Able to use RPE daily in rehab to express subjective intensity level;Long Term:  Able to use RPE to guide intensity level when exercising independently    Knowledge and understanding of Target Heart Rate Range (THRR) Yes Yes Yes Yes    Intervention Provide education and explanation of THRR including how the numbers were predicted and where they are located for reference Provide education and explanation of THRR including how the numbers were predicted and where they are located for reference Provide education and explanation of THRR including how the numbers were predicted and where they are located for reference Provide education and explanation of THRR including how the numbers were predicted and where they are located for reference    Expected Outcomes Short Term: Able to state/look up THRR;Long Term: Able to use THRR to govern intensity when exercising independently;Short Term: Able to use daily as guideline for intensity in rehab Short Term: Able to state/look up THRR;Long Term: Able to use THRR to govern intensity when exercising independently;Short Term: Able to use daily as guideline for intensity in rehab Short Term: Able to state/look up THRR;Long Term: Able to use THRR to govern intensity when exercising independently;Short Term: Able to use daily as guideline for intensity in rehab Short Term: Able to state/look up THRR;Long Term: Able to use THRR to govern intensity when exercising independently;Short Term: Able to use daily as guideline for intensity in rehab    Able to check pulse independently Yes Yes Yes Yes    Intervention Provide education and demonstration on how to check pulse in carotid and radial arteries.;Review the importance  of being able to check your own pulse for safety during independent exercise Provide education and demonstration on how to  check pulse in carotid and radial arteries.;Review the importance of being able to check your own pulse for safety during independent exercise Provide education and demonstration on how to check pulse in carotid and radial arteries.;Review the importance of being able to check your own pulse for safety during independent exercise Provide education and demonstration on how to check pulse in carotid and radial arteries.;Review the importance of being able to check your own pulse for safety during independent exercise    Expected Outcomes Short Term: Able to explain why pulse checking is important during independent exercise;Long Term: Able to check pulse independently and accurately Short Term: Able to explain why pulse checking is important during independent exercise;Long Term: Able to check pulse independently and accurately Short Term: Able to explain why pulse checking is important during independent exercise;Long Term: Able to check pulse independently and accurately Short Term: Able to explain why pulse checking is important during independent exercise;Long Term: Able to check pulse independently and accurately    Understanding of Exercise Prescription Yes Yes Yes Yes    Intervention Provide education, explanation, and written materials on patient's individual exercise prescription Provide education, explanation, and written materials on patient's individual exercise prescription Provide education, explanation, and written materials on patient's individual exercise prescription Provide education, explanation, and written materials on patient's individual exercise prescription    Expected Outcomes Short Term: Able to explain program exercise prescription;Long Term: Able to explain home exercise prescription to exercise independently Short Term: Able to explain program exercise  prescription;Long Term: Able to explain home exercise prescription to exercise independently Short Term: Able to explain program exercise prescription;Long Term: Able to explain home exercise prescription to exercise independently Short Term: Able to explain program exercise prescription;Long Term: Able to explain home exercise prescription to exercise independently             Exercise Goals Re-Evaluation:  Exercise Goals Re-Evaluation     Row Name 11/27/20 1333 11/27/20 1334 12/26/20 0842 01/23/21 0811       Exercise Goal Re-Evaluation   Exercise Goals Review Increase Physical Activity;Increase Strength and Stamina;Able to understand and use rate of perceived exertion (RPE) scale;Knowledge and understanding of Target Heart Rate Range (THRR);Able to check pulse independently;Understanding of Exercise Prescription Increase Physical Activity;Increase Strength and Stamina;Able to understand and use rate of perceived exertion (RPE) scale;Knowledge and understanding of Target Heart Rate Range (THRR);Able to check pulse independently;Understanding of Exercise Prescription Increase Physical Activity;Increase Strength and Stamina;Able to understand and use rate of perceived exertion (RPE) scale;Knowledge and understanding of Target Heart Rate Range (THRR);Able to check pulse independently;Understanding of Exercise Prescription Increase Physical Activity;Increase Strength and Stamina;Able to understand and use rate of perceived exertion (RPE) scale;Knowledge and understanding of Target Heart Rate Range (THRR);Able to check pulse independently;Understanding of Exercise Prescription    Comments -- Pt has attended 10 exercise sessions of cardiac rehab. He has been able to progress and seems to enjoy coming to rehab. His BPs have been elevated, especially his diastolic. He is currently exercising at 2.77 METs on the stepper. Will continue to monitor and progress as able. Pt has attended 22 exercise sessions. He  has been able to progress throughout the program, but his BPs have remained elevated. He is currently exercising at 3.03 METs on the stepper. Will continue to monitor and progress as able. Pt has completed 30 sessions of cardiac rehab. He continues to progress through the program and will graduate at the end of the week. I will encourage him to continue  to exercise on his own at home after the program. He is currently exercising at 2.76 METs on the stepper. Will continue to monitor and progress as able.    Expected Outcomes Through exercise at rehab and at home, the patient will meet their stated goals. Through exercise at rehab and at home, the patient will meet their stated goals. Through exercise at rehab and at home, the patient will meet their stated goals. Through exercise at rehab and at home, the patient will meet their stated goals.             Nutrition & Weight - Outcomes:  Pre Biometrics - 11/02/20 1008       Pre Biometrics   Height '5\' 10"'  (1.778 m)    Weight 213 lb 3 oz (96.7 kg)    Waist Circumference 42 inches    Hip Circumference 42 inches    Waist to Hip Ratio 1 %    BMI (Calculated) 30.59    Triceps Skinfold 13 mm    % Body Fat 29 %    Grip Strength 41.9 kg    Flexibility 0 in    Single Leg Stand 0 seconds             Post Biometrics - 01/26/21 1201        Post  Biometrics   Height '5\' 10"'  (1.778 m)    Weight 209 lb 3.5 oz (94.9 kg)    Waist Circumference 42 inches    Hip Circumference 42 inches    Waist to Hip Ratio 1 %    BMI (Calculated) 30.02    Triceps Skinfold 12 mm    % Body Fat 28.5 %    Grip Strength 44.4 kg    Flexibility 0 in    Single Leg Stand 0 seconds             Nutrition:  Nutrition Therapy & Goals - 11/20/20 0736       Personal Nutrition Goals   Comments Patient scored 94 on his diet assessment. We offer 2 educational sessions with handout regarding heart healthy nutrition and assistance with RD referral if patient is  interested.      Intervention Plan   Intervention Nutrition handout(s) given to patient.             Nutrition Discharge:  Nutrition Assessments - 02/06/21 0919       MEDFICTS Scores   Pre Score 94    Post Score 60    Score Difference -34             Education Questionnaire Score:  Knowledge Questionnaire Score - 02/06/21 0919       Knowledge Questionnaire Score   Pre Score 20/24    Post Score 23/24             Goals reviewed with patient; copy given to patient. Pt graduated from cardiac rehab after 32 sessions. His attendance was consistent and he always gave good effort. His blood pressure was usually elevated upon arrival and would drop substantially after exercise, and he often complained of dizziness after exercise. His weight was stable throughout the program. He was able to increase his six minute walk test distance by 6% from orientation to discharge. His MET level at discharge was 2.83. He did not have a concrete plan for continuing exercise post graduation, but I encouraged him to walk at his house or to join a fitness center.

## 2021-02-08 NOTE — Addendum Note (Signed)
Addended by: Karle Barr on: 02/08/2021 05:58 PM   Modules accepted: Level of Service
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
## 2021-02-27 ENCOUNTER — Other Ambulatory Visit: Payer: Self-pay

## 2021-02-27 ENCOUNTER — Encounter: Payer: Self-pay | Admitting: Cardiology

## 2021-02-27 ENCOUNTER — Ambulatory Visit: Payer: PPO | Admitting: Cardiology

## 2021-02-27 VITALS — BP 140/80 | HR 80 | Ht 70.0 in | Wt 208.0 lb

## 2021-02-27 DIAGNOSIS — Z9889 Other specified postprocedural states: Secondary | ICD-10-CM

## 2021-02-27 DIAGNOSIS — E7800 Pure hypercholesterolemia, unspecified: Secondary | ICD-10-CM

## 2021-02-27 DIAGNOSIS — I2510 Atherosclerotic heart disease of native coronary artery without angina pectoris: Secondary | ICD-10-CM | POA: Diagnosis not present

## 2021-02-27 DIAGNOSIS — E854 Organ-limited amyloidosis: Secondary | ICD-10-CM | POA: Diagnosis not present

## 2021-02-27 DIAGNOSIS — I4819 Other persistent atrial fibrillation: Secondary | ICD-10-CM | POA: Diagnosis not present

## 2021-02-27 DIAGNOSIS — I680 Cerebral amyloid angiopathy: Secondary | ICD-10-CM

## 2021-02-27 DIAGNOSIS — I10 Essential (primary) hypertension: Secondary | ICD-10-CM | POA: Diagnosis not present

## 2021-02-27 MED ORDER — CARVEDILOL 12.5 MG PO TABS: 6.2500 mg | ORAL_TABLET | Freq: Two times a day (BID) | ORAL | 0 refills | Status: DC

## 2021-02-27 MED ORDER — CARVEDILOL 6.25 MG PO TABS: 6.2500 mg | ORAL_TABLET | Freq: Two times a day (BID) | ORAL | 3 refills | Status: DC

## 2021-02-27 NOTE — Patient Instructions (Signed)
Reduce carvedilol to 6.25 mg twice a day  Hold Zetia for now. If dizziness goes away we may rechallenge you with this later.

## 2021-03-07 ENCOUNTER — Other Ambulatory Visit: Payer: Self-pay | Admitting: Cardiology

## 2021-08-14 ENCOUNTER — Ambulatory Visit (INDEPENDENT_AMBULATORY_CARE_PROVIDER_SITE_OTHER): Payer: PPO | Admitting: Family Medicine

## 2021-08-14 ENCOUNTER — Encounter: Payer: Self-pay | Admitting: Family Medicine

## 2021-08-14 VITALS — BP 150/90 | HR 77 | Temp 98.2°F | Ht 68.0 in | Wt 203.4 lb

## 2021-08-14 DIAGNOSIS — I2510 Atherosclerotic heart disease of native coronary artery without angina pectoris: Secondary | ICD-10-CM

## 2021-08-14 DIAGNOSIS — R7303 Prediabetes: Secondary | ICD-10-CM

## 2021-08-14 DIAGNOSIS — R2689 Other abnormalities of gait and mobility: Secondary | ICD-10-CM

## 2021-08-14 DIAGNOSIS — Z0000 Encounter for general adult medical examination without abnormal findings: Secondary | ICD-10-CM | POA: Diagnosis not present

## 2021-08-14 DIAGNOSIS — Z125 Encounter for screening for malignant neoplasm of prostate: Secondary | ICD-10-CM

## 2021-08-14 DIAGNOSIS — Z85820 Personal history of malignant melanoma of skin: Secondary | ICD-10-CM | POA: Diagnosis not present

## 2021-08-14 DIAGNOSIS — Z7189 Other specified counseling: Secondary | ICD-10-CM

## 2021-08-14 DIAGNOSIS — E559 Vitamin D deficiency, unspecified: Secondary | ICD-10-CM | POA: Diagnosis not present

## 2021-08-14 DIAGNOSIS — I10 Essential (primary) hypertension: Secondary | ICD-10-CM

## 2021-08-14 DIAGNOSIS — Z23 Encounter for immunization: Secondary | ICD-10-CM | POA: Diagnosis not present

## 2021-08-14 DIAGNOSIS — G4733 Obstructive sleep apnea (adult) (pediatric): Secondary | ICD-10-CM

## 2021-08-14 DIAGNOSIS — N183 Chronic kidney disease, stage 3 unspecified: Secondary | ICD-10-CM | POA: Diagnosis not present

## 2021-08-14 DIAGNOSIS — E785 Hyperlipidemia, unspecified: Secondary | ICD-10-CM

## 2021-08-14 DIAGNOSIS — R454 Irritability and anger: Secondary | ICD-10-CM

## 2021-08-14 DIAGNOSIS — H905 Unspecified sensorineural hearing loss: Secondary | ICD-10-CM

## 2021-08-14 DIAGNOSIS — E854 Organ-limited amyloidosis: Secondary | ICD-10-CM

## 2021-08-14 DIAGNOSIS — Z9889 Other specified postprocedural states: Secondary | ICD-10-CM

## 2021-08-14 DIAGNOSIS — S51819A Laceration without foreign body of unspecified forearm, initial encounter: Secondary | ICD-10-CM

## 2021-08-14 LAB — CBC WITH DIFFERENTIAL/PLATELET
Basophils Absolute: 0 10*3/uL (ref 0.0–0.1)
Basophils Relative: 0.8 % (ref 0.0–3.0)
Eosinophils Absolute: 0.2 10*3/uL (ref 0.0–0.7)
Eosinophils Relative: 3.3 % (ref 0.0–5.0)
HCT: 42 % (ref 39.0–52.0)
Hemoglobin: 14.1 g/dL (ref 13.0–17.0)
Lymphocytes Relative: 22.4 % (ref 12.0–46.0)
Lymphs Abs: 1.3 10*3/uL (ref 0.7–4.0)
MCHC: 33.6 g/dL (ref 30.0–36.0)
MCV: 102.2 fl — ABNORMAL HIGH (ref 78.0–100.0)
Monocytes Absolute: 0.5 10*3/uL (ref 0.1–1.0)
Monocytes Relative: 7.7 % (ref 3.0–12.0)
Neutro Abs: 3.9 10*3/uL (ref 1.4–7.7)
Neutrophils Relative %: 65.8 % (ref 43.0–77.0)
Platelets: 218 10*3/uL (ref 150.0–400.0)
RBC: 4.11 Mil/uL — ABNORMAL LOW (ref 4.22–5.81)
RDW: 13.7 % (ref 11.5–15.5)
WBC: 6 10*3/uL (ref 4.0–10.5)

## 2021-08-14 LAB — COMPREHENSIVE METABOLIC PANEL
ALT: 25 U/L (ref 0–53)
AST: 32 U/L (ref 0–37)
Albumin: 4.3 g/dL (ref 3.5–5.2)
Alkaline Phosphatase: 62 U/L (ref 39–117)
BUN: 21 mg/dL (ref 6–23)
CO2: 29 mEq/L (ref 19–32)
Calcium: 9.4 mg/dL (ref 8.4–10.5)
Chloride: 102 mEq/L (ref 96–112)
Creatinine, Ser: 1.4 mg/dL (ref 0.40–1.50)
GFR: 49.02 mL/min — ABNORMAL LOW (ref >60.00)
Glucose, Bld: 105 mg/dL — ABNORMAL HIGH (ref 70–99)
Potassium: 4.4 mEq/L (ref 3.5–5.1)
Sodium: 139 mEq/L (ref 135–145)
Total Bilirubin: 1.2 mg/dL (ref 0.2–1.2)
Total Protein: 7 g/dL (ref 6.0–8.3)

## 2021-08-14 LAB — LIPID PANEL
Cholesterol: 132 mg/dL (ref 0–200)
HDL: 60.2 mg/dL (ref >39.00)
LDL Cholesterol: 57 mg/dL (ref 0–99)
NonHDL: 71.52
Total CHOL/HDL Ratio: 2
Triglycerides: 72 mg/dL (ref 0.0–149.0)
VLDL: 14.4 mg/dL (ref 0.0–40.0)

## 2021-08-14 LAB — VITAMIN B12: Vitamin B-12: >1504 pg/mL — ABNORMAL HIGH (ref 211–911)

## 2021-08-14 LAB — TSH: TSH: 1.3 u[IU]/mL (ref 0.35–5.50)

## 2021-08-14 LAB — HEMOGLOBIN A1C: Hgb A1c MFr Bld: 6 % (ref 4.6–6.5)

## 2021-08-14 LAB — VITAMIN D 25 HYDROXY (VIT D DEFICIENCY, FRACTURES): VITD: 38.49 ng/mL (ref 30.00–100.00)

## 2021-08-14 LAB — PSA: PSA: 0.96 ng/mL (ref 0.10–4.00)

## 2021-08-14 NOTE — Assessment & Plan Note (Signed)

## 2021-08-14 NOTE — Assessment & Plan Note (Signed)
Advanced directive discussion - has this at home, asked to bring copy. Wife then daughter Judson Roch are [place].  ?

## 2021-08-14 NOTE — Progress Notes (Signed)
? ? Patient ID: Jesse [name], male    DOB: June 03, 1945, 76 y.o.   MRN: [id number] ? ?This visit was conducted in person. ? ?BP (!) 150/90 (BP Location: Right Arm, Cuff Size: Large)   Pulse 77   Temp 98.2 ?F (36.8 ?C) (Temporal)   Ht '5\' 8"'$  (1.727 m)   Wt 203 lb 6 oz (92.3 kg)   SpO2 97%   BMI 30.92 kg/m?   ? ?CC: AMW/CPE ?Subjective:  ? ?HPI: ?Jesse [name] is a 76 y.o. male presenting on 08/14/2021 for Medicare Wellness (Pt accompanied by wife, [name]. ) ? ? ?I last saw patient 03/2020. No recent physical.  ?Did not see health advisor this year.  ? ?Hearing Screening  ? '500Hz'$  '1000Hz'$  '2000Hz'$  '4000Hz'$   ?Right ear 25 0 0 0  ?Left ear 20 0 0 0  ?Comments: Pt states has had hearing problems in bilateral ears. But recognizes hearing is worse. Fm hx of hearing problems.  ? ?Vision Screening - Comments:: Last eye exam, 07/2020.  ?[facility] Visit from 08/14/2021 in [place] at [place]  ?PHQ-2 Total Score 0  ? ?  ?Congenital hereditary high tone hearing loss. Would be interested in seeing audiologist for re evaluation as last seen many years ago.  ? ?  08/14/2021  ? 11:33 AM 11/02/2020  ?  8:13 AM 04/03/2016  ?  9:11 AM  ?Fall Risk   ?Falls in the past year? 0 1 No  ?Number falls in past yr:  0   ?Injury with Fall?  1   ?Risk for fall due to :  History of fall(s);Impaired balance/gait   ?Follow up  Falls evaluation completed;Education provided;Falls prevention discussed   ? ? ?NSTEMI 2012 with DES to RCA, residual LAD stenosis followed clinically. Cardiac catheterization 03/2020 showed nonobstructive CAD.  ?MRI brain at ER 02/2020 showed chronic ischemic changes consistent with amyloid angiopathy - rec avoid long term anticoagulation.  ?S/p minimally invasive MV repair by Dr Roxy Manns 07/2020. Rec SBE ppx. Was on coumadin ?Developed afib Spring 2022 - s/p successful DCCV 09/2020, now off coumadin only on aspirin '81mg'$  daily.  ?Last saw cardiology 02/2021 - needs to call and schedule f/u visit.  ?H/o ARF to  hyzaar. Continues coreg 6.'25mg'$  bid.  ? ?Saw neurology 05/2020 [facility]) for chronic cerebral microbleeds due to amyloid angiopathy. Rec avoid long term anticoagulation. ?underlying neuropathy as well.   ?S/p concussion 02/2020.  ? ?He notes more difficulty with imbalance/instability.  ?He is considering starting working out at gym through [employer].  ?Would be interested in balance training program.  ?He noted benefit after doing PT through [facility].  ? ?Preventative: ?COLONOSCOPY 11/2017 - TA x2, diverticulosis, rpt 5 yrs ([place]) ?Prostate cancer screening - discussed, nocturia x2, will start [place] hPSA.  ?Lung cancer screening - not eligible  ?Flu shot -yearly ?COVID shot - Moderna 06/2019, 07/2019 ?Td 2000, rpt today due to multiple cuts on skin ?Pneumovax 2012, prevnar-13 2017  ?Shingrix - discussed to check at pharmacy ?Advanced directive discussion - has this at home, asked to bring copy. Wife then daughter Judson Roch are [place].  ?Seat belt use discussed ?Sunscreen use discussed. No changing moles on skin. H/o melanoma 1990. Has not seen dermatology since - referral placed today.  ?Smoking - quit 1980 ?Alcohol - infrequent use  ?Dentist - q6 mo ?Eye exam - yearly ?Bowel - no constipation ?Bladder - no incontinence ? ?Lives in [place], [place] with [place]. Has 1 daughter and 1  son.  ?Retired Corporate treasurer ?   ? ?Relevant past medical, surgical, family and social history reviewed and updated as indicated. Interim medical history since our last visit reviewed. ?Allergies and medications reviewed and updated. ?Outpatient Medications Prior to Visit  ?Medication Sig Dispense Refill  ? Ascorbic Acid (VITAMIN C) 1000 MG tablet Take 1,000 mg by mouth daily.    ? aspirin EC 81 MG tablet Take 81 mg by mouth every evening. Swallow whole.    ? atorvastatin (LIPITOR) 80 MG tablet TAKE 1 TABLET BY MOUTH ONCE A DAY 90 tablet 3  ? carvedilol (COREG) 6.25 MG tablet Take 1 tablet (6.25 mg total) by mouth 2 (two)  times daily. 180 tablet 3  ? Cholecalciferol (VITAMIN D3) 1000 units CAPS Take 1 capsule (1,000 Units total) by mouth daily. 30 capsule   ? glucose blood test strip Use to test sugar daily AS NEEDED 30 each 3  ? vitamin B-12 (CYANOCOBALAMIN) 1000 MCG tablet Take 1,000 mcg by mouth every evening.    ? nitroGLYCERIN (NITROSTAT) 0.4 MG SL tablet Place 1 tablet (0.4 mg total) under the tongue every 5 (five) minutes as needed for chest pain. 25 tablet 11  ? ezetimibe (ZETIA) 10 MG tablet Take 1 tablet (10 mg total) by mouth daily. (Patient taking differently: Take 10 mg by mouth every evening.) 90 tablet 3  ? ?No facility-administered medications prior to visit.  ?  ? ?Per HPI unless specifically indicated in ROS section below ?Review of Systems  ?Constitutional:  Negative for activity change, appetite change, chills, fatigue, fever and unexpected weight change.  ?HENT:  Negative for hearing loss.   ?Eyes:  Negative for visual disturbance.  ?Respiratory:  Positive for cough (a few weeks ago). Negative for chest tightness, shortness of breath and wheezing.   ?Cardiovascular:  Negative for chest pain, palpitations and leg swelling.  ?Gastrointestinal:  Positive for constipation (managed with metamucil). Negative for abdominal distention, abdominal pain, blood in stool, diarrhea, nausea and vomiting.  ?Genitourinary:  Negative for difficulty urinating and hematuria.  ?Musculoskeletal:  Negative for arthralgias, myalgias and neck pain.  ?Skin:  Negative for rash.  ?Neurological:  Positive for dizziness (occ orthostatic). Negative for seizures, syncope and headaches.  ?Hematological:  Negative for adenopathy. Bruises/bleeds easily.  ?Psychiatric/Behavioral:  Negative for dysphoric mood. The patient is not nervous/anxious.   ? ?Objective:  ?BP (!) 150/90 (BP Location: Right Arm, Cuff Size: Large)   Pulse 77   Temp 98.2 ?F (36.8 ?C) (Temporal)   Ht '5\' 8"'$  (1.727 m)   Wt 203 lb 6 oz (92.3 kg)   SpO2 97%   BMI 30.92 kg/m?    ?Wt Readings from Last 3 Encounters:  ?08/14/21 203 lb 6 oz (92.3 kg)  ?02/27/21 208 lb (94.3 kg)  ?01/26/21 209 lb 3.5 oz (94.9 kg)  ?  ?  ?Physical Exam ?Vitals and nursing note reviewed.  ?Constitutional:   ?   General: He is not in acute distress. ?   Appearance: Normal appearance. He is well-developed. He is not ill-appearing.  ?HENT:  ?   Head: Normocephalic and atraumatic.  ?   Right Ear: Hearing, tympanic membrane, ear canal and external ear normal.  ?   Left Ear: Hearing, tympanic membrane, ear canal and external ear normal.  ?Eyes:  ?   General: No scleral icterus. ?   Extraocular Movements: Extraocular movements intact.  ?   Conjunctiva/sclera: Conjunctivae normal.  ?   Pupils: Pupils are equal, round, and reactive to light.  ?  Neck:  ?   Thyroid: No thyroid mass or thyromegaly.  ?   Vascular: No carotid bruit.  ?Cardiovascular:  ?   Rate and Rhythm: Normal rate and regular rhythm.  ?   Pulses: Normal pulses.     ?     Radial pulses are 2+ on the right side and 2+ on the left side.  ?   Heart sounds: Murmur (2/6 systolic USB) heard.  ?Pulmonary:  ?   Effort: Pulmonary effort is normal. No respiratory distress.  ?   Breath sounds: Normal breath sounds. No wheezing, rhonchi or rales.  ?Abdominal:  ?   General: Bowel sounds are normal. There is no distension.  ?   Palpations: Abdomen is soft. There is no mass.  ?   Tenderness: There is no abdominal tenderness. There is no guarding or rebound.  ?   Hernia: No hernia is present.  ?Musculoskeletal:     ?   General: Normal range of motion.  ?   Cervical back: Normal range of motion and neck supple.  ?   Right lower leg: No edema.  ?   Left lower leg: No edema.  ?Lymphadenopathy:  ?   Cervical: No cervical adenopathy.  ?Skin: ?   General: Skin is warm and dry.  ?   Findings: No rash.  ?Neurological:  ?   General: No focal deficit present.  ?   Mental Status: He is alert and oriented to person, place, and time.  ?   Comments:  ?Recall 3/3 ?Calculation 5/5 serial  3s   ?Psychiatric:     ?   Mood and Affect: Mood normal.     ?   Behavior: Behavior normal.     ?   Thought Content: Thought content normal.     ?   Judgment: Judgment normal.  ? ?   ?Results for orders placed or perform

## 2021-08-14 NOTE — Assessment & Plan Note (Signed)
Endorses h/o stage 4 melanoma treated at [facility] back in 1990.  ?Has not seen dermatology since. ?Will refer to derm establish care. Previously saw Dr Nevada Crane in [place].  ?

## 2021-08-14 NOTE — Patient Instructions (Addendum)
Td tetanus shot today.  ?If interested, check with pharmacy about new 2 shot shingles series (shingrix).  ?Labs today  ?We will refer you to hearing doctor in [place].  ?Check on balance program through the gym.  ?Bring me copy of your living will.  ?Let me know if interested in medicine to possibly help irritability.  ?We will refer you to dermatologist.  ?Return as needed or in 6 months for follow up visit.  ? ?Health Maintenance After Age 76 ?After age 82, you are at a higher risk for certain long-term diseases and infections as well as injuries from falls. Falls are a major cause of broken bones and head injuries in people who are older than age 21. Getting regular preventive care can help to keep you healthy and well. Preventive care includes getting regular testing and making lifestyle changes as recommended by your health care provider. Talk with your health care provider about: ?Which screenings and tests you should have. A screening is a test that checks for a disease when you have no symptoms. ?A diet and exercise plan that is right for you. ?What should I know about screenings and tests to prevent falls? ?Screening and testing are the best ways to find a health problem early. Early diagnosis and treatment give you the best chance of managing medical conditions that are common after age 64. Certain conditions and lifestyle choices may make you more likely to have a fall. Your health care provider may recommend: ?Regular vision checks. Poor vision and conditions such as cataracts can make you more likely to have a fall. If you wear glasses, make sure to get your prescription updated if your vision changes. ?Medicine review. Work with your health care provider to regularly review all of the medicines you are taking, including over-the-counter medicines. Ask your health care provider about any side effects that may make you more likely to have a fall. Tell your health care provider if any medicines that  you take make you feel dizzy or sleepy. ?Strength and balance checks. Your health care provider may recommend certain tests to check your strength and balance while standing, walking, or changing positions. ?Foot health exam. Foot pain and numbness, as well as not wearing proper footwear, can make you more likely to have a fall. ?Screenings, including: ?Osteoporosis screening. Osteoporosis is a condition that causes the bones to get weaker and break more easily. ?Blood pressure screening. Blood pressure changes and medicines to control blood pressure can make you feel dizzy. ?Depression screening. You may be more likely to have a fall if you have a fear of falling, feel depressed, or feel unable to do activities that you used to do. ?Alcohol use screening. Using too much alcohol can affect your balance and may make you more likely to have a fall. ?Follow these instructions at home: ?Lifestyle ?Do not drink alcohol if: ?Your health care provider tells you not to drink. ?If you drink alcohol: ?Limit how much you have to: ?0-1 drink a day for women. ?0-2 drinks a day for men. ?Know how much alcohol is in your drink. In the [place] one drink equals one 12 oz bottle of beer (355 mL), one 5 oz glass of wine (148 mL), or one 1? oz glass of hard liquor (44 mL). ?Do not use any products that contain nicotine or tobacco. These products include cigarettes, chewing tobacco, and vaping devices, such as e-cigarettes. If you need help quitting, ask your health care provider. ?Activity ? ?Follow a  regular exercise program to stay fit. This will help you maintain your balance. Ask your health care provider what types of exercise are appropriate for you. ?If you need a cane or walker, use it as recommended by your health care provider. ?Wear supportive shoes that have nonskid soles. ?Safety ? ?Remove any tripping hazards, such as rugs, cords, and clutter. ?Install safety equipment such as grab bars in bathrooms and safety rails on  stairs. ?Keep rooms and walkways well-lit. ?General instructions ?Talk with your health care provider about your risks for falling. Tell your health care provider if: ?You fall. Be sure to tell your health care provider about all falls, even ones that seem minor. ?You feel dizzy, tiredness (fatigue), or off-balance. ?Take over-the-counter and prescription medicines only as told by your health care provider. These include supplements. ?Eat a healthy diet and maintain a healthy weight. A healthy diet includes low-fat dairy products, low-fat (lean) meats, and fiber from whole grains, beans, and lots of fruits and vegetables. ?Stay current with your vaccines. ?Schedule regular health, dental, and eye exams. ?Summary ?Having a healthy lifestyle and getting preventive care can help to protect your health and wellness after age 23. ?Screening and testing are the best way to find a health problem early and help you avoid having a fall. Early diagnosis and treatment give you the best chance for managing medical conditions that are more common for people who are older than age 35. ?Falls are a major cause of broken bones and head injuries in people who are older than age 45. Take precautions to prevent a fall at home. ?Work with your health care provider to learn what changes you can make to improve your health and wellness and to prevent falls. ?This information is not intended to replace advice given to you by your health care provider. Make sure you discuss any questions you have with your health care provider. ?Document Revised: 09/18/2020 Document Reviewed: 09/18/2020 ?Elsevier Patient Education ? [place]. ? ?

## 2021-08-14 NOTE — Assessment & Plan Note (Signed)
Preventative protocols reviewed and updated unless pt declined. Discussed healthy diet and lifestyle.  

## 2021-08-17 ENCOUNTER — Telehealth: Payer: Self-pay

## 2021-08-17 ENCOUNTER — Encounter: Payer: Self-pay | Admitting: Family Medicine

## 2021-08-17 ENCOUNTER — Other Ambulatory Visit: Payer: Self-pay | Admitting: Family Medicine

## 2021-08-17 DIAGNOSIS — H905 Unspecified sensorineural hearing loss: Secondary | ICD-10-CM | POA: Insufficient documentation

## 2021-08-17 MED ORDER — VITAMIN B-12 1000 MCG PO TABS: 1000.0000 ug | ORAL_TABLET | ORAL | Status: DC

## 2021-08-17 NOTE — Assessment & Plan Note (Addendum)
Carries this diagnosis, unsure if he's being treated. Will need to clarify next visit.  ?

## 2021-08-17 NOTE — Assessment & Plan Note (Deleted)
Update renal function.  

## 2021-08-17 NOTE — Telephone Encounter (Signed)
Spoke to patient follow up appointment scheduled with Dr.Jordan 08/31/21 at 10:40 am. ?

## 2021-08-17 NOTE — Telephone Encounter (Signed)
Pt is requesting a call from you... no specific details given ?

## 2021-08-17 NOTE — Assessment & Plan Note (Signed)
Update renal function.  

## 2021-08-17 NOTE — Assessment & Plan Note (Signed)
He and wife endorse longstanding history of irritability, easily frustrated. Discussed possible medication to help with this (SSRI) - he will consider but currently declines.  ?

## 2021-08-17 NOTE — Assessment & Plan Note (Addendum)
Chronic, elevated today. Encouraged cardiology f/u. No med changes today.  ?

## 2021-08-17 NOTE — Assessment & Plan Note (Signed)
Regularly sees cardiology, continue aspirin, statin. ?

## 2021-08-17 NOTE — Assessment & Plan Note (Signed)
Congenital hereditary high tone hearing loss. Would be interested in seeing audiologist for re evaluation as last seen many years ago.  ?

## 2021-08-17 NOTE — Assessment & Plan Note (Signed)
Chronic, stable. Appreciate cards care.  ?

## 2021-08-17 NOTE — Assessment & Plan Note (Signed)
Increases bleeding risk therefore advised against long term anticoagulation - only on aspirin '81mg'$  daily. Discussed this can contribute to cognitive impairment. Will continue to monitor.  ?

## 2021-08-17 NOTE — Assessment & Plan Note (Signed)
May be interested in balance training PT - will first check at local gym and let me know. Previously benefited from outpatient PT at [facility].  ?

## 2021-08-17 NOTE — Assessment & Plan Note (Signed)
Update levels. 

## 2021-08-17 NOTE — Assessment & Plan Note (Signed)
Update A1c. Encouraged limiting added sugars in diet.  ?

## 2021-08-17 NOTE — Assessment & Plan Note (Signed)
Chronic, update FLP on atorvastatin '80mg'$  daily. ?The 10-year ASCVD risk score (Arnett DK, et al., 2019) is: 50% ?  Values used to calculate the score: ?    Age: 76 years ?    Sex: Male ?    Is Non-Hispanic African American: No ?    Diabetic: Yes ?    Tobacco smoker: No ?    Systolic Blood Pressure: 321 mmHg ?    Is BP treated: Yes ?    HDL Cholesterol: 60.2 mg/dL ?    Total Cholesterol: 132 mg/dL  ?
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
## 2021-08-27 DIAGNOSIS — E7800 Pure hypercholesterolemia, unspecified: Secondary | ICD-10-CM | POA: Diagnosis not present

## 2021-08-27 DIAGNOSIS — I680 Cerebral amyloid angiopathy: Secondary | ICD-10-CM | POA: Diagnosis not present

## 2021-08-27 DIAGNOSIS — Z9889 Other specified postprocedural states: Secondary | ICD-10-CM | POA: Diagnosis not present

## 2021-08-27 DIAGNOSIS — I4819 Other persistent atrial fibrillation: Secondary | ICD-10-CM | POA: Diagnosis not present

## 2021-08-27 DIAGNOSIS — E854 Organ-limited amyloidosis: Secondary | ICD-10-CM | POA: Diagnosis not present

## 2021-08-27 DIAGNOSIS — I2510 Atherosclerotic heart disease of native coronary artery without angina pectoris: Secondary | ICD-10-CM | POA: Diagnosis not present

## 2021-08-27 DIAGNOSIS — I10 Essential (primary) hypertension: Secondary | ICD-10-CM | POA: Diagnosis not present

## 2021-08-27 LAB — BASIC METABOLIC PANEL
BUN/Creatinine Ratio: 9 — ABNORMAL LOW (ref 10–24)
BUN: 13 mg/dL (ref 8–27)
CO2: 22 mmol/L (ref 20–29)
Calcium: 9.4 mg/dL (ref 8.6–10.2)
Chloride: 105 mmol/L (ref 96–106)
Creatinine, Ser: 1.41 mg/dL — ABNORMAL HIGH (ref 0.76–1.27)
Glucose: 121 mg/dL — ABNORMAL HIGH (ref 70–99)
Potassium: 4.4 mmol/L (ref 3.5–5.2)
Sodium: 142 mmol/L (ref 134–144)
eGFR: 52 mL/min/{1.73_m2} — ABNORMAL LOW (ref >59)

## 2021-08-27 LAB — HEPATIC FUNCTION PANEL
ALT: 31 IU/L (ref 0–44)
AST: 39 IU/L (ref 0–40)
Albumin: 4.2 g/dL (ref 3.7–4.7)
Alkaline Phosphatase: 78 IU/L (ref 44–121)
Bilirubin Total: 0.9 mg/dL (ref 0.0–1.2)
Bilirubin, Direct: 0.23 mg/dL (ref 0.00–0.40)
Total Protein: 6.6 g/dL (ref 6.0–8.5)

## 2021-08-27 LAB — CBC WITH DIFFERENTIAL/PLATELET
Basophils Absolute: 0.1 10*3/uL (ref 0.0–0.2)
Basos: 1 %
EOS (ABSOLUTE): 0.2 10*3/uL (ref 0.0–0.4)
Eos: 3 %
Hematocrit: 40.9 % (ref 37.5–51.0)
Hemoglobin: 14.4 g/dL (ref 13.0–17.7)
Immature Grans (Abs): 0 10*3/uL (ref 0.0–0.1)
Immature Granulocytes: 0 %
Lymphocytes Absolute: 1.4 10*3/uL (ref 0.7–3.1)
Lymphs: 21 %
MCH: 35 pg — ABNORMAL HIGH (ref 26.6–33.0)
MCHC: 35.2 g/dL (ref 31.5–35.7)
MCV: 99 fL — ABNORMAL HIGH (ref 79–97)
Monocytes Absolute: 0.5 10*3/uL (ref 0.1–0.9)
Monocytes: 8 %
Neutrophils Absolute: 4.5 10*3/uL (ref 1.4–7.0)
Neutrophils: 67 %
Platelets: 204 10*3/uL (ref 150–450)
RBC: 4.12 x10E6/uL — ABNORMAL LOW (ref 4.14–5.80)
RDW: 12.7 % (ref 11.6–15.4)
WBC: 6.7 10*3/uL (ref 3.4–10.8)

## 2021-08-27 LAB — LIPID PANEL
Chol/HDL Ratio: 2 ratio (ref 0.0–5.0)
Cholesterol, Total: 128 mg/dL (ref 100–199)
HDL: 65 mg/dL (ref >39)
LDL Chol Calc (NIH): 49 mg/dL (ref 0–99)
Triglycerides: 71 mg/dL (ref 0–149)
VLDL Cholesterol Cal: 14 mg/dL (ref 5–40)

## 2021-08-28 ENCOUNTER — Encounter: Payer: Self-pay | Admitting: *Deleted

## 2021-08-30 ENCOUNTER — Encounter: Payer: Self-pay | Admitting: *Deleted

## 2021-08-31 ENCOUNTER — Ambulatory Visit: Payer: PPO | Admitting: Cardiology

## 2021-08-31 ENCOUNTER — Encounter: Payer: Self-pay | Admitting: Cardiology

## 2021-08-31 VITALS — BP 140/90 | HR 83 | Ht 68.0 in | Wt 201.0 lb

## 2021-08-31 DIAGNOSIS — I10 Essential (primary) hypertension: Secondary | ICD-10-CM | POA: Diagnosis not present

## 2021-08-31 DIAGNOSIS — Z9889 Other specified postprocedural states: Secondary | ICD-10-CM

## 2021-08-31 DIAGNOSIS — E7800 Pure hypercholesterolemia, unspecified: Secondary | ICD-10-CM

## 2021-08-31 DIAGNOSIS — I4819 Other persistent atrial fibrillation: Secondary | ICD-10-CM | POA: Diagnosis not present

## 2021-08-31 DIAGNOSIS — I2510 Atherosclerotic heart disease of native coronary artery without angina pectoris: Secondary | ICD-10-CM | POA: Diagnosis not present

## 2022-01-31 ENCOUNTER — Encounter: Payer: Self-pay | Admitting: Family Medicine

## 2022-02-05 ENCOUNTER — Telehealth: Payer: Self-pay

## 2022-02-05 NOTE — Patient Outreach (Signed)
  Care Coordination   02/05/2022 Name: Jesse [name] MRN: [id number] DOB: Mar 26, 1946   Care Coordination Outreach Attempts:  An unsuccessful telephone outreach was attempted today to offer the patient information about available care coordination services as a benefit of their health plan.   Follow Up Plan:  Additional outreach attempts will be made to offer the patient care coordination information and services.   Encounter Outcome:  No Answer  Care Coordination Interventions Activated:  No   Care Coordination Interventions:  No, not indicated    Quinn Plowman RN,BSN,CCM [place] [phone] direct line

## 2022-02-06 ENCOUNTER — Ambulatory Visit: Payer: PPO | Admitting: Family Medicine

## 2022-02-06 NOTE — Telephone Encounter (Signed)
Jesse [name] - Client Nonclinical Telephone Record  [facility] Client Jesse [name] - Client Client Site [place] - [name] Contact Type Call Who Is Calling Patient / Member / Family / Caregiver Caller Name Lenell Lama Phone Number [phone] Call Type Message Only Information Provided Reason for Call Returning a Call from the Office Initial Comment Caller states thats he missed a called from Mrs. Green, from the office. Disp. Time Disposition Final User 02/05/2022 5:03:07 PM General Information Provided Yes Jari Favre Call Closed By: Jari Favre Transaction Date/Time: 02/05/2022 5:00:46 PM (ET

## 2022-02-13 ENCOUNTER — Ambulatory Visit (INDEPENDENT_AMBULATORY_CARE_PROVIDER_SITE_OTHER): Payer: PPO | Admitting: Family Medicine

## 2022-02-13 ENCOUNTER — Encounter: Payer: Self-pay | Admitting: Family Medicine

## 2022-02-13 VITALS — BP 138/82 | HR 76 | Temp 98.0°F | Ht 68.0 in | Wt 193.4 lb

## 2022-02-13 DIAGNOSIS — I680 Cerebral amyloid angiopathy: Secondary | ICD-10-CM

## 2022-02-13 DIAGNOSIS — H905 Unspecified sensorineural hearing loss: Secondary | ICD-10-CM

## 2022-02-13 DIAGNOSIS — E854 Organ-limited amyloidosis: Secondary | ICD-10-CM | POA: Diagnosis not present

## 2022-02-13 DIAGNOSIS — R2689 Other abnormalities of gait and mobility: Secondary | ICD-10-CM

## 2022-02-13 DIAGNOSIS — Z23 Encounter for immunization: Secondary | ICD-10-CM | POA: Diagnosis not present

## 2022-02-13 NOTE — Progress Notes (Unsigned)
Patient ID: Jesse [name], male    DOB: 11-May-1946, 76 y.o.   MRN: [id number]  This visit was conducted in person.  BP 138/82   Pulse 76   Temp 98 F (36.7 C) (Temporal)   Ht '5\' 8"'$  (1.727 m)   Wt 193 lb 6 oz (87.7 kg)   SpO2 98%   BMI 29.40 kg/m    CC: 6 mo f/u visit  Subjective:   HPI: Jesse [name] is a 76 y.o. male presenting on 02/13/2022 for Follow-up (Here for 6 mo f/u.)   S/p minimally invasive MVR by Dr Roxy Manns 07/2020. Developed atrial fibrillation afterwards, s/p successful DCCV 09/2020.   Saw neurology 05/2020 [facility]) for chronic cerebral microbleeds thought due to amyloid angiopathy.  Was on coumadin - this was discontinued due to concern for cerebral amyloid angiopathy.   Congenital hereditary high tone hearing loss - referred to audiologist, but hasn't yet seen them.   He had stopped going to the gym, but over the past week he's started going again. Notes ongoing difficulty with balance progressively worsening. Would like to see neurologist for this in [place]. Younger sister has MS. Denies foot paresthesias/numbness, back pain or radiculopathy symptoms. No leg weakness. Gym doesn't have balance program. He wants to see neurology prior to scheduling physical therapy.   There has been concern about his memory as well increased irritability.    Geriatric Assessment:  Activities of Daily Living:     Bathing- independent     Dressing- independent     Eating- independent     Toileting- independent     Transferring- independent     Continence- independent  Overall Assessment: independent   Instrumental Activities of Daily Living:     Transportation- independent     Meal/Food Preparation- independent     Shopping Errands- independent     Housekeeping/Chores- independent     Money Management/Finances- dependent (wife manages)    Medication Management- independent     Ability to Use Telephone- independent     Laundry- independent  Overall Assessment:   independent   Mental Status Exam: 27/30 (value/max value)     Clock Drawing Score: 4/4     Relevant past medical, surgical, family and social history reviewed and updated as indicated. Interim medical history since our last visit reviewed. Allergies and medications reviewed and updated. Outpatient Medications Prior to Visit  Medication Sig Dispense Refill   Ascorbic Acid (VITAMIN C) 1000 MG tablet Take 1,000 mg by mouth daily.     aspirin EC 81 MG tablet Take 81 mg by mouth every evening. Swallow whole.     atorvastatin (LIPITOR) 80 MG tablet TAKE 1 TABLET BY MOUTH ONCE A DAY 90 tablet 3   carvedilol (COREG) 6.25 MG tablet Take 1 tablet (6.25 mg total) by mouth 2 (two) times daily. 180 tablet 3   Cholecalciferol (VITAMIN D3) 1000 units CAPS Take 1 capsule (1,000 Units total) by mouth daily. 30 capsule    glucose blood test strip Use to test sugar daily AS NEEDED 30 each 3   nitroGLYCERIN (NITROSTAT) 0.4 MG SL tablet Place 1 tablet (0.4 mg total) under the tongue every 5 (five) minutes as needed for chest pain. 25 tablet 11   vitamin B-12 (CYANOCOBALAMIN) 1000 MCG tablet Take 1 tablet (1,000 mcg total) by mouth 2 (two) times a week.     No facility-administered medications prior to visit.     Per HPI unless specifically indicated in ROS section below Review  of Systems  Objective:  BP 138/82   Pulse 76   Temp 98 F (36.7 C) (Temporal)   Ht '5\' 8"'$  (1.727 m)   Wt 193 lb 6 oz (87.7 kg)   SpO2 98%   BMI 29.40 kg/m   Wt Readings from Last 3 Encounters:  02/13/22 193 lb 6 oz (87.7 kg)  08/31/21 201 lb (91.2 kg)  08/14/21 203 lb 6 oz (92.3 kg)      Physical Exam Vitals and nursing note reviewed.  Constitutional:      Appearance: Normal appearance. He is not ill-appearing.  HENT:     Mouth/Throat:     Mouth: Mucous membranes are moist.     Pharynx: Oropharynx is clear. No oropharyngeal exudate or posterior oropharyngeal erythema.  Eyes:     Extraocular Movements: Extraocular  movements intact.     Pupils: Pupils are equal, round, and reactive to light.  Cardiovascular:     Rate and Rhythm: Normal rate and regular rhythm.     Pulses: Normal pulses.     Heart sounds: Normal heart sounds. No murmur heard. Pulmonary:     Effort: Pulmonary effort is normal. No respiratory distress.     Breath sounds: Normal breath sounds. No wheezing, rhonchi or rales.  Musculoskeletal:     Cervical back: Normal range of motion and neck supple.     Right lower leg: No edema.     Left lower leg: No edema.  Skin:    General: Skin is warm and dry.     Findings: No rash.  Neurological:     Mental Status: He is alert.     Cranial Nerves: Cranial nerves 2-12 are intact.     Sensory: Sensation is intact.     Motor: Motor function is intact.     Coordination: Coordination abnormal.     Comments:  CN 2-12 intact FTN intact EOMI Unsteady with romberg test No pronator drift 5/5 strength BLE  Psychiatric:        Mood and Affect: Mood normal.        Behavior: Behavior normal.       Results for orders placed or performed in visit on 08/14/21  Vitamin B12  Result Value Ref Range   Vitamin B-12 >1504 (H) 211 - 911 pg/mL  VITAMIN D 25 Hydroxy (Vit-D Deficiency, Fractures)  Result Value Ref Range   VITD 38.49 30.00 - 100.00 ng/mL  Lipid panel  Result Value Ref Range   Cholesterol 132 0 - 200 mg/dL   Triglycerides 72.0 0.0 - 149.0 mg/dL   HDL 60.20 >39.00 mg/dL   VLDL 14.4 0.0 - 40.0 mg/dL   LDL Cholesterol 57 0 - 99 mg/dL   Total CHOL/HDL Ratio 2    NonHDL 71.52   Comprehensive metabolic panel  Result Value Ref Range   Sodium 139 135 - 145 mEq/L   Potassium 4.4 3.5 - 5.1 mEq/L   Chloride 102 96 - 112 mEq/L   CO2 29 19 - 32 mEq/L   Glucose, Bld 105 (H) 70 - 99 mg/dL   BUN 21 6 - 23 mg/dL   Creatinine, Ser 1.40 0.40 - 1.50 mg/dL   Total Bilirubin 1.2 0.2 - 1.2 mg/dL   Alkaline Phosphatase 62 39 - 117 U/L   AST 32 0 - 37 U/L   ALT 25 0 - 53 U/L   Total Protein 7.0  6.0 - 8.3 g/dL   Albumin 4.3 3.5 - 5.2 g/dL   GFR 49.02 (L) >60.00 mL/min  Calcium 9.4 8.4 - 10.5 mg/dL  TSH  Result Value Ref Range   TSH 1.30 0.35 - 5.50 uIU/mL  PSA  Result Value Ref Range   PSA 0.96 0.10 - 4.00 ng/mL  Hemoglobin A1c  Result Value Ref Range   Hgb A1c MFr Bld 6.0 4.6 - 6.5 %  CBC with Differential/Platelet  Result Value Ref Range   WBC 6.0 4.0 - 10.5 K/uL   RBC 4.11 (L) 4.22 - 5.81 Mil/uL   Hemoglobin 14.1 13.0 - 17.0 g/dL   HCT 42.0 39.0 - 52.0 %   MCV 102.2 (H) 78.0 - 100.0 fl   MCHC 33.6 30.0 - 36.0 g/dL   RDW 13.7 11.5 - 15.5 %   Platelets 218.0 150.0 - 400.0 K/uL   Neutrophils Relative % 65.8 43.0 - 77.0 %   Lymphocytes Relative 22.4 12.0 - 46.0 %   Monocytes Relative 7.7 3.0 - 12.0 %   Eosinophils Relative 3.3 0.0 - 5.0 %   Basophils Relative 0.8 0.0 - 3.0 %   Neutro Abs 3.9 1.4 - 7.7 K/uL   Lymphs Abs 1.3 0.7 - 4.0 K/uL   Monocytes Absolute 0.5 0.1 - 1.0 K/uL   Eosinophils Absolute 0.2 0.0 - 0.7 K/uL   Basophils Absolute 0.0 0.0 - 0.1 K/uL    Assessment & Plan:   Problem List Items Addressed This Visit   None Visit Diagnoses     Need for influenza vaccination    -  Primary   Relevant Orders   Flu Vaccine QUAD High Dose(Fluad) (Completed)        No orders of the defined types were placed in this encounter.  Orders Placed This Encounter  Procedures   Flu Vaccine QUAD High Dose(Fluad)     Patient Instructions  Flu shot today I do recommend hearing evaluation - let me know if you need a referral.  Let me know if interested in physical therapy referral for balance training.  We will refer you back to neurology in [facility]) to discuss balance.  More water, less sodas.  Return in 6 months for physical/wellness visit.    Follow up plan: No follow-ups on file.  Ria Bush, MD

## 2022-02-13 NOTE — Patient Instructions (Addendum)
Flu shot today I do recommend hearing evaluation - let me know if you need a referral.  Let me know if interested in physical therapy referral for balance training.  We will refer you back to neurology in [facility]) to discuss balance.  More water, less sodas.  Return in 6 months for physical/wellness visit.

## 2022-02-14 DIAGNOSIS — R413 Other amnesia: Secondary | ICD-10-CM | POA: Insufficient documentation

## 2022-02-14 NOTE — Assessment & Plan Note (Signed)
Has not seen audiology. Declines referral today.

## 2022-02-14 NOTE — Assessment & Plan Note (Addendum)
Chronic, ongoing. Describes unsteadiness without vertigo or presyncope/syncope. Of unclear cause. ?amyloid related. He is somewhat unsteady on feet, however denies peripheral neuropathy or radiculopathy symptoms. Labs stable including vitamin b12, D, thyroid.  Will refer back to neurology for further evaluation.  I think he would benefit from PT balance training program - he desires to hold on PT until neuro eval.

## 2022-02-14 NOTE — Assessment & Plan Note (Signed)
?  contributing to imbalance - await neuro eval.

## 2022-02-19 ENCOUNTER — Telehealth: Payer: Self-pay

## 2022-02-19 NOTE — Patient Outreach (Signed)
  Care Coordination   02/19/2022 Name: Jesse [name] MRN: [id number] DOB: 23-Apr-1946   Care Coordination Outreach Attempts:  A second unsuccessful outreach was attempted today to offer the patient with information about available care coordination services as a benefit of their health plan.     Follow Up Plan:  Additional outreach attempts will be made to offer the patient care coordination information and services.   Encounter Outcome:  No Answer  Care Coordination Interventions Activated:  No   Care Coordination Interventions:  No, not indicated    Quinn Plowman RN,BSN,CCM [place] [phone] direct line

## 2022-04-07 IMAGING — MR MR HEAD W/O CM
12 of 13 series · 44 of 48 positions shown · non-contrast
Comparison: CTA of the head neck from earlier today.

CLINICAL DATA: Neuro deficit with stroke suspected. Fall and
vertigo

EXAM:
MRI HEAD WITHOUT CONTRAST
TECHNIQUE: Multiplanar, multiecho pulse sequences of the brain and surrounding
structures were obtained without intravenous contrast.

[Series 5: DWI · axial · 3.0mm · 0.88mm/px · z∈[-48,+92]mm · 8 of 100 slices shown (1 of 4)]
[im 1/100]
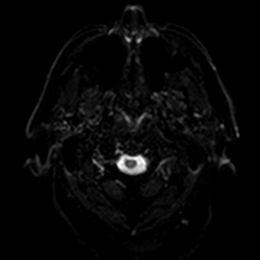
[im 15/100]
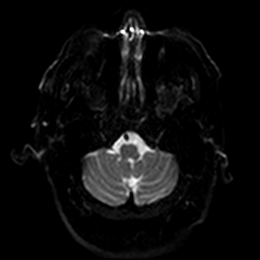
[im 29/100]
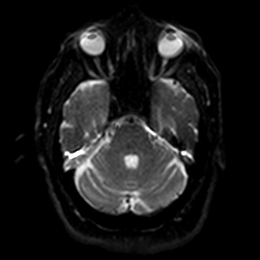
[im 43/100]
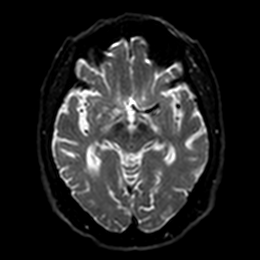
[im 57/100]
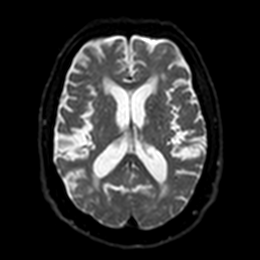
[im 71/100]
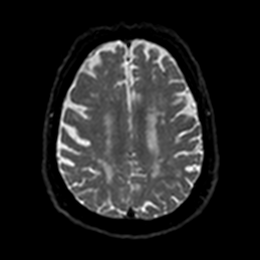
[im 85/100]
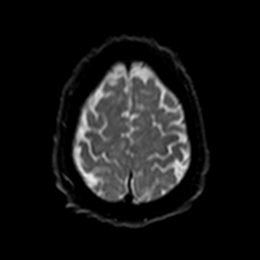
[im 100/100]
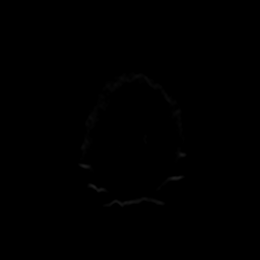

[Series 6: DWI · axial · 3.0mm · 0.88mm/px · z∈[-48,+92]mm · 4 of 50 slices shown (2 of 4)]
[im 1/50]
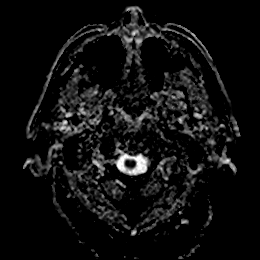
[im 17/50]
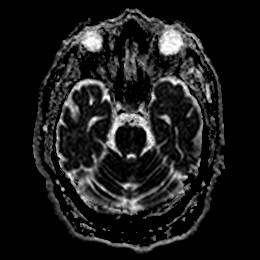
[im 33/50]
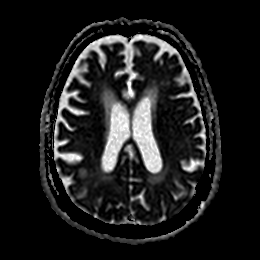
[im 50/50]
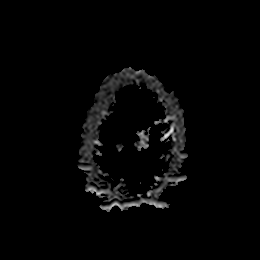

[Series 7: DWI · coronal · 4.0mm · 0.88mm/px · 5 of 74 slices shown (3 of 4)]
[im 1/74]
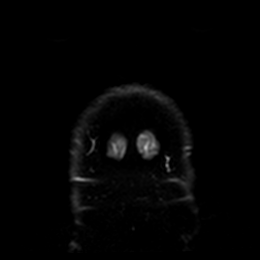
[im 19/74]
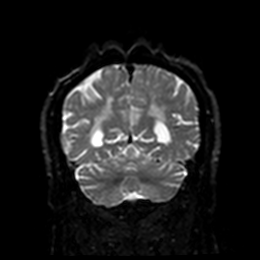
[im 37/74]
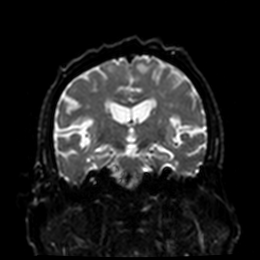
[im 55/74]
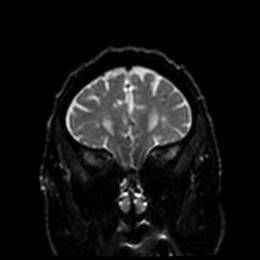
[im 74/74]
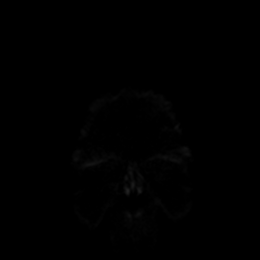

[Series 8: DWI · coronal · 4.0mm · 0.88mm/px · 3 of 37 slices shown (4 of 4)]
[im 1/37]
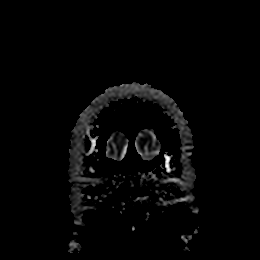
[im 19/37]
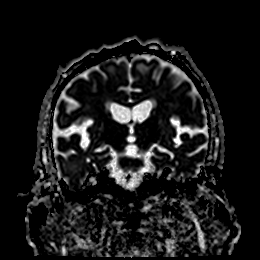
[im 37/37]
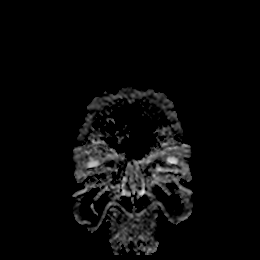

[Series 9: T1 · sagittal · 5.0mm · 0.75mm/px · 2 of 27 slices shown]
[im 1/27]
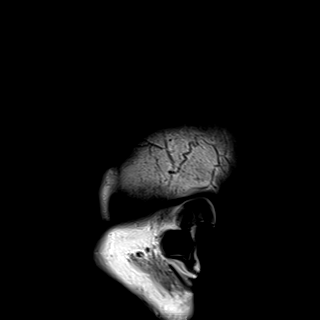
[im 27/27]
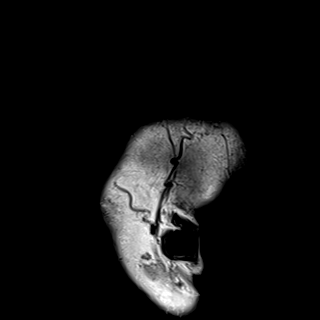

[Series 10: T2 · axial · 5.0mm · 0.72mm/px · z∈[-53,+96]mm · 2 of 27 slices shown (1 of 2)]
[im 1/27]
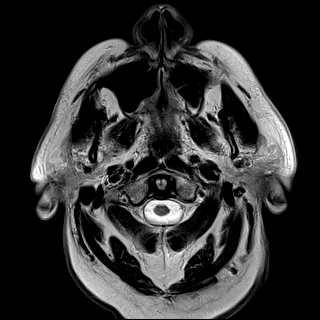
[im 27/27]
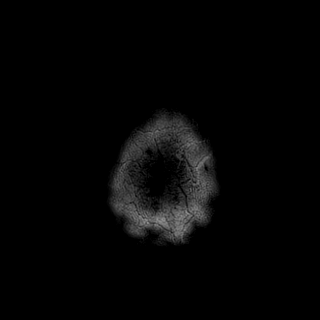

[Series 11: FLAIR · axial · 5.0mm · 0.45mm/px · z∈[-55,+94]mm · 2 of 27 slices shown]
[im 1/27]
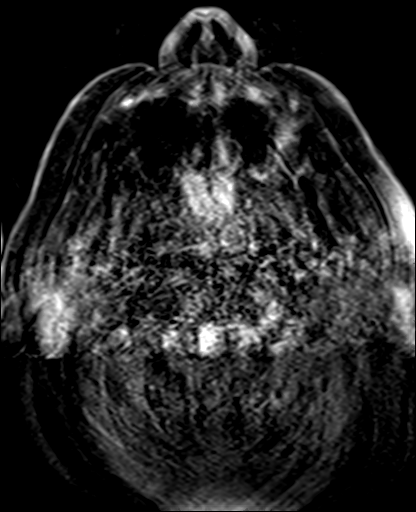
[im 27/27]
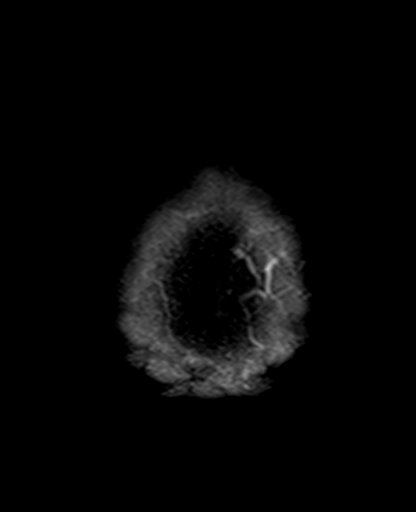

[Series 12: mag_images · axial · 3.0mm · 0.90mm/px · z∈[-65,+104]mm · 4 of 60 slices shown]
[im 1/60]
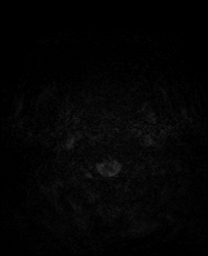
[im 20/60]
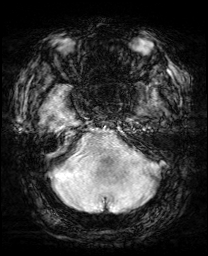
[im 40/60]
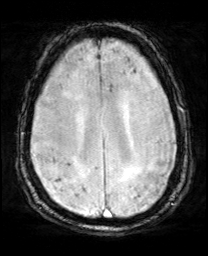
[im 60/60]
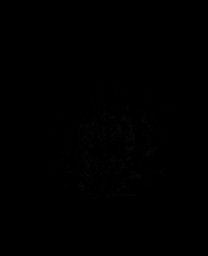

[Series 13: pha_images · axial · 3.0mm · 0.90mm/px · z∈[-65,+99]mm · 4 of 56 slices shown]
[im 1/56]
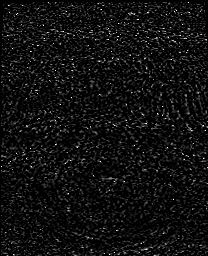
[im 19/56]
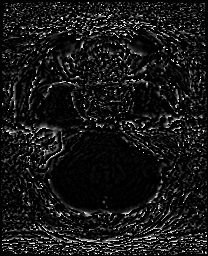
[im 37/56]
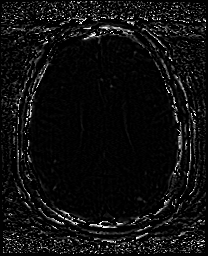
[im 56/56]
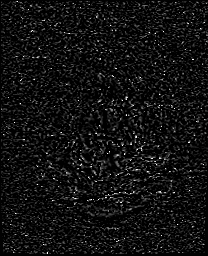

[Series 14: swi_images · axial · 3.0mm · 0.90mm/px · z∈[-65,+104]mm · 4 of 60 slices shown]
[im 1/60]
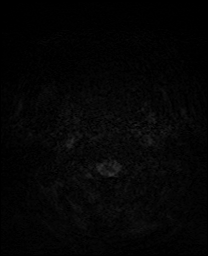
[im 20/60]
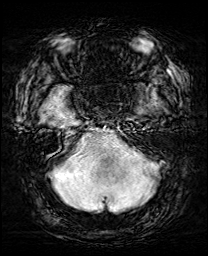
[im 40/60]
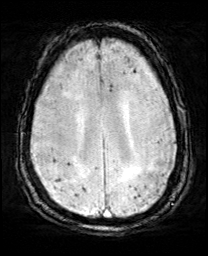
[im 60/60]
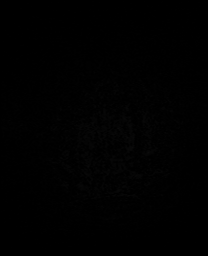

[Series 15: mip_images(sw) · axial · 24.0mm · 0.90mm/px · z∈[-55,+94]mm · 4 of 53 slices shown]
[im 1/53]
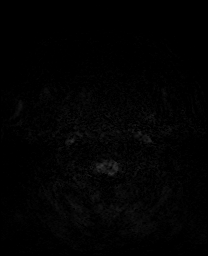
[im 18/53]
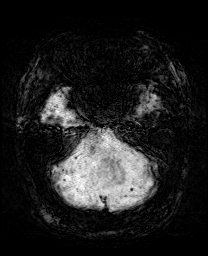
[im 35/53]
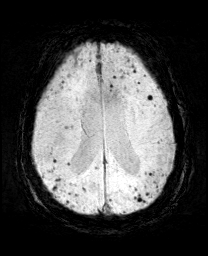
[im 53/53]
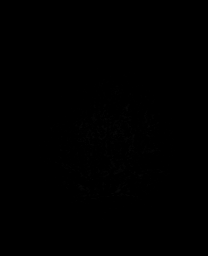

[Series 17: T2 · coronal · 5.0mm · 0.34mm/px · 2 of 31 slices shown (2 of 2)]
[im 1/31]
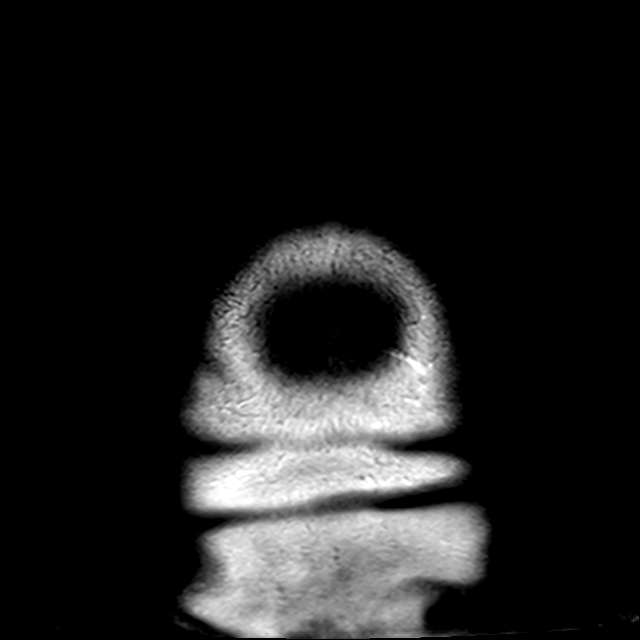
[im 31/31]
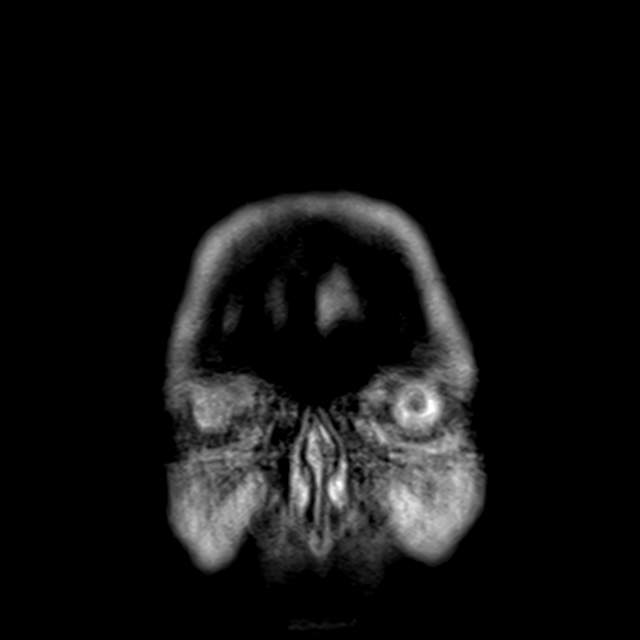

[44 of 48 positions shown; findings below may reference images not displayed]

FINDINGS: Brain: No acute infarction, hemorrhage, hydrocephalus, extra-axial
collection or mass lesion. Innumerable remote micro hemorrhages
along the bilateral cerebral convexities. Although gradient imaging
is motion degraded these appear parenchymal and rounded/nodular.
Chronic small vessel ischemic change in the deep cerebral white
matter. Mild for age cerebral volume loss.

Vascular: Normal flow voids.

Skull and upper cervical spine: Normal marrow signal.

Sinuses/Orbits: Negative

Other: Progressively motion degraded study
IMPRESSION: 1. No acute finding, including infarct.
2. Findings of amyloid angiopathy.
3. Moderate chronic small vessel ischemia.
4. Motion degraded.

## 2022-04-08 ENCOUNTER — Other Ambulatory Visit: Payer: Self-pay | Admitting: Cardiology

## 2022-04-09 NOTE — Telephone Encounter (Signed)
Patient called to check status of prescription due to only having a few tablets left.

## 2022-04-22 ENCOUNTER — Encounter: Payer: Self-pay | Admitting: Diagnostic Neuroimaging

## 2022-04-22 ENCOUNTER — Ambulatory Visit: Payer: PPO | Admitting: Diagnostic Neuroimaging

## 2022-04-22 ENCOUNTER — Telehealth: Payer: Self-pay | Admitting: Diagnostic Neuroimaging

## 2022-04-22 VITALS — BP 165/90 | HR 74 | Ht 69.0 in | Wt 192.0 lb

## 2022-04-22 DIAGNOSIS — R269 Unspecified abnormalities of gait and mobility: Secondary | ICD-10-CM | POA: Diagnosis not present
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
## 2022-04-22 NOTE — Telephone Encounter (Signed)
Referral for Physical Therapy fax to [place], [place]. Phone: [phone], Fax: [phone].

## 2022-05-29 ENCOUNTER — Telehealth: Payer: Self-pay

## 2022-05-29 NOTE — Patient Outreach (Signed)
  Care Coordination   05/29/2022 Name: Jesse [name] MRN: [id number] DOB: Sep 28, 1945   Care Coordination Outreach Attempts:  A third unsuccessful outreach was attempted today to offer the patient with information about available care coordination services as a benefit of their health plan.   Follow Up Plan:  No further outreach attempts will be made at this time. We have been unable to contact the patient to offer or enroll patient in care coordination services  Encounter Outcome:  No Answer   Care Coordination Interventions:  No, not indicated    Quinn Plowman [facility] [place] [phone] direct line

## 2022-06-04 ENCOUNTER — Telehealth: Payer: Self-pay | Admitting: Cardiology

## 2022-06-04 NOTE — Telephone Encounter (Signed)
Patient would like Malachy Mood to give him a call.

## 2022-06-05 NOTE — Telephone Encounter (Signed)
Spoke with pt, Follow up scheduled per recall.

## 2022-06-05 NOTE — Telephone Encounter (Signed)
Patient is following up, again requesting a call back from [place], [place].

## 2022-06-14 ENCOUNTER — Other Ambulatory Visit: Payer: Self-pay | Admitting: Cardiology

## 2022-07-16 DIAGNOSIS — M79669 Pain in unspecified lower leg: Secondary | ICD-10-CM | POA: Diagnosis not present

## 2022-07-17 ENCOUNTER — Telehealth: Payer: Self-pay | Admitting: Family Medicine

## 2022-07-17 NOTE — Telephone Encounter (Signed)
Contacted Jesse [name] to schedule their annual wellness visit. Patient declined to schedule AWV at this time.  [place] Direct Dial: [phone]

## 2022-07-18 DIAGNOSIS — M79669 Pain in unspecified lower leg: Secondary | ICD-10-CM | POA: Diagnosis not present

## 2022-07-23 DIAGNOSIS — M79669 Pain in unspecified lower leg: Secondary | ICD-10-CM | POA: Diagnosis not present

## 2022-07-25 DIAGNOSIS — M79669 Pain in unspecified lower leg: Secondary | ICD-10-CM | POA: Diagnosis not present

## 2022-07-30 DIAGNOSIS — M79669 Pain in unspecified lower leg: Secondary | ICD-10-CM | POA: Diagnosis not present

## 2022-08-01 DIAGNOSIS — M79669 Pain in unspecified lower leg: Secondary | ICD-10-CM | POA: Diagnosis not present

## 2022-08-06 DIAGNOSIS — M79669 Pain in unspecified lower leg: Secondary | ICD-10-CM | POA: Diagnosis not present

## 2022-08-08 DIAGNOSIS — R2681 Unsteadiness on feet: Secondary | ICD-10-CM | POA: Diagnosis not present

## 2022-08-14 ENCOUNTER — Telehealth: Payer: Self-pay | Admitting: Family Medicine

## 2022-08-14 NOTE — Telephone Encounter (Signed)
Per wife's mychart message: "You asked me to send you a message about Jesse [name] getting a handicap sticker. What do I need to do to get him one. Thanks. His balance is horrible. He can't walk without holding on to something. He is going to physical therapy at [facility]. Twice a week"   Reviewed neurology note from 04/2022 - h/o neuropathy/gait difficulty, chronic cerebral microbleeds ?amyloid angiopathy. Rec PT which he is receiving from [facility].   Filled out handicap placard application form and placed in Lisa's box.  Plz notify pt/wife

## 2022-08-15 NOTE — Telephone Encounter (Signed)
Called patient let know form ready for pick up at our reception. Copy made and sent to scan
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
## 2022-08-23 ENCOUNTER — Ambulatory Visit: Payer: PPO | Attending: Cardiology | Admitting: Cardiology

## 2022-08-23 ENCOUNTER — Encounter: Payer: Self-pay | Admitting: Cardiology

## 2022-08-23 VITALS — BP 150/80 | HR 70 | Ht 69.0 in | Wt 193.0 lb

## 2022-08-23 DIAGNOSIS — I680 Cerebral amyloid angiopathy: Secondary | ICD-10-CM

## 2022-08-23 DIAGNOSIS — E854 Organ-limited amyloidosis: Secondary | ICD-10-CM

## 2022-08-23 DIAGNOSIS — I2510 Atherosclerotic heart disease of native coronary artery without angina pectoris: Secondary | ICD-10-CM

## 2022-08-23 DIAGNOSIS — I10 Essential (primary) hypertension: Secondary | ICD-10-CM | POA: Diagnosis not present

## 2022-08-23 DIAGNOSIS — Z9889 Other specified postprocedural states: Secondary | ICD-10-CM

## 2022-08-23 DIAGNOSIS — E7800 Pure hypercholesterolemia, unspecified: Secondary | ICD-10-CM | POA: Diagnosis not present

## 2022-08-23 MED ORDER — ATORVASTATIN CALCIUM 80 MG PO TABS: 80.0000 mg | ORAL_TABLET | Freq: Every day | ORAL | 3 refills | Status: DC

## 2022-08-23 NOTE — Patient Instructions (Signed)
Medication Instructions:  Your physician recommends that you continue on your current medications as directed. Please refer to the Current Medication list given to you today.  *If you need a refill on your cardiac medications before your next appointment, please call your pharmacy*  Lab Work: CMET, CBC, Lipid today  If you have labs (blood work) drawn today and your tests are completely normal, you will receive your results only by: MyChart Message (if you have MyChart) OR A paper copy in the mail If you have any lab test that is abnormal or we need to change your treatment, we will call you to review the results.  Follow-Up: At [facility], you and your health needs are our priority.  As part of our continuing mission to provide you with exceptional heart care, we have created designated Provider Care Teams.  These Care Teams include your primary Cardiologist (physician) and Advanced Practice Providers (APPs -  Physician Assistants and Nurse Practitioners) who all work together to provide you with the care you need, when you need it.  We recommend signing up for the patient portal called "MyChart".  Sign up information is provided on this After Visit Summary.  MyChart is used to connect with patients for Virtual Visits (Telemedicine).  Patients are able to view lab/test results, encounter notes, upcoming appointments, etc.  Non-urgent messages can be sent to your provider as well.   To learn more about what you can do with MyChart, go to [email or web address].    Your next appointment:   12 month(s)  Provider:   Peter [place], MD

## 2022-08-24 LAB — COMPREHENSIVE METABOLIC PANEL
ALT: 24 IU/L (ref 0–44)
AST: 24 IU/L (ref 0–40)
Albumin/Globulin Ratio: 1.7 (ref 1.2–2.2)
Albumin: 4.4 g/dL (ref 3.8–4.8)
Alkaline Phosphatase: 82 IU/L (ref 44–121)
BUN/Creatinine Ratio: 11 (ref 10–24)
BUN: 16 mg/dL (ref 8–27)
Bilirubin Total: 0.9 mg/dL (ref 0.0–1.2)
CO2: 23 mmol/L (ref 20–29)
Calcium: 9.8 mg/dL (ref 8.6–10.2)
Chloride: 102 mmol/L (ref 96–106)
Creatinine, Ser: 1.43 mg/dL — ABNORMAL HIGH (ref 0.76–1.27)
Globulin, Total: 2.6 g/dL (ref 1.5–4.5)
Glucose: 117 mg/dL — ABNORMAL HIGH (ref 70–99)
Potassium: 4.3 mmol/L (ref 3.5–5.2)
Sodium: 141 mmol/L (ref 134–144)
Total Protein: 7 g/dL (ref 6.0–8.5)
eGFR: 51 mL/min/{1.73_m2} — ABNORMAL LOW (ref >59)

## 2022-08-24 LAB — LIPID PANEL
Chol/HDL Ratio: 1.9 ratio (ref 0.0–5.0)
Cholesterol, Total: 140 mg/dL (ref 100–199)
HDL: 75 mg/dL (ref >39)
LDL Chol Calc (NIH): 52 mg/dL (ref 0–99)
Triglycerides: 61 mg/dL (ref 0–149)
VLDL Cholesterol Cal: 13 mg/dL (ref 5–40)

## 2022-08-24 LAB — CBC
Hematocrit: 40.2 % (ref 37.5–51.0)
Hemoglobin: 14 g/dL (ref 13.0–17.7)
MCH: 34.7 pg — ABNORMAL HIGH (ref 26.6–33.0)
MCHC: 34.8 g/dL (ref 31.5–35.7)
MCV: 100 fL — ABNORMAL HIGH (ref 79–97)
Platelets: 252 10*3/uL (ref 150–450)
RBC: 4.03 x10E6/uL — ABNORMAL LOW (ref 4.14–5.80)
RDW: 13 % (ref 11.6–15.4)
WBC: 6.1 10*3/uL (ref 3.4–10.8)

## 2022-08-26 ENCOUNTER — Ambulatory Visit: Payer: PPO | Admitting: Nurse Practitioner

## 2022-09-10 DIAGNOSIS — R2681 Unsteadiness on feet: Secondary | ICD-10-CM | POA: Diagnosis not present

## 2022-09-12 ENCOUNTER — Emergency Department (HOSPITAL_COMMUNITY): Payer: PPO

## 2022-09-12 ENCOUNTER — Other Ambulatory Visit: Payer: Self-pay

## 2022-09-12 ENCOUNTER — Inpatient Hospital Stay (HOSPITAL_COMMUNITY)
Admission: EM | Admit: 2022-09-12 | Discharge: 2022-09-23 | DRG: 535 | Disposition: A | Discharge status: Skilled Nursing Facility | Payer: PPO | Attending: Internal Medicine | Admitting: Internal Medicine

## 2022-09-12 DIAGNOSIS — S32402A Unspecified fracture of left acetabulum, initial encounter for closed fracture: Secondary | ICD-10-CM | POA: Diagnosis present

## 2022-09-12 DIAGNOSIS — S329XXA Fracture of unspecified parts of lumbosacral spine and pelvis, initial encounter for closed fracture: Secondary | ICD-10-CM

## 2022-09-12 DIAGNOSIS — Z955 Presence of coronary angioplasty implant and graft: Secondary | ICD-10-CM

## 2022-09-12 DIAGNOSIS — I2510 Atherosclerotic heart disease of native coronary artery without angina pectoris: Secondary | ICD-10-CM | POA: Diagnosis present

## 2022-09-12 DIAGNOSIS — Y92481 Parking lot as the place of occurrence of the external cause: Secondary | ICD-10-CM | POA: Diagnosis not present

## 2022-09-12 DIAGNOSIS — R066 Hiccough: Secondary | ICD-10-CM | POA: Diagnosis present

## 2022-09-12 DIAGNOSIS — R2990 Unspecified symptoms and signs involving the nervous system: Secondary | ICD-10-CM

## 2022-09-12 DIAGNOSIS — R2689 Other abnormalities of gait and mobility: Secondary | ICD-10-CM | POA: Diagnosis not present

## 2022-09-12 DIAGNOSIS — R440 Auditory hallucinations: Secondary | ICD-10-CM | POA: Diagnosis not present

## 2022-09-12 DIAGNOSIS — S32431A Displaced fracture of anterior column [iliopubic] of right acetabulum, initial encounter for closed fracture: Secondary | ICD-10-CM | POA: Diagnosis not present

## 2022-09-12 DIAGNOSIS — S32414A Nondisplaced fracture of anterior wall of right acetabulum, initial encounter for closed fracture: Secondary | ICD-10-CM | POA: Diagnosis not present

## 2022-09-12 DIAGNOSIS — R7303 Prediabetes: Secondary | ICD-10-CM | POA: Diagnosis present

## 2022-09-12 DIAGNOSIS — I680 Cerebral amyloid angiopathy: Secondary | ICD-10-CM | POA: Diagnosis present

## 2022-09-12 DIAGNOSIS — R4182 Altered mental status, unspecified: Secondary | ICD-10-CM | POA: Diagnosis present

## 2022-09-12 DIAGNOSIS — I129 Hypertensive chronic kidney disease with stage 1 through stage 4 chronic kidney disease, or unspecified chronic kidney disease: Secondary | ICD-10-CM | POA: Diagnosis present

## 2022-09-12 DIAGNOSIS — Z85820 Personal history of malignant melanoma of skin: Secondary | ICD-10-CM | POA: Diagnosis not present

## 2022-09-12 DIAGNOSIS — G4730 Sleep apnea, unspecified: Secondary | ICD-10-CM | POA: Diagnosis present

## 2022-09-12 DIAGNOSIS — N1831 Chronic kidney disease, stage 3a: Secondary | ICD-10-CM | POA: Diagnosis present

## 2022-09-12 DIAGNOSIS — R102 Pelvic and perineal pain: Secondary | ICD-10-CM | POA: Diagnosis not present

## 2022-09-12 DIAGNOSIS — M4316 Spondylolisthesis, lumbar region: Secondary | ICD-10-CM | POA: Diagnosis not present

## 2022-09-12 DIAGNOSIS — G9341 Metabolic encephalopathy: Secondary | ICD-10-CM | POA: Diagnosis present

## 2022-09-12 DIAGNOSIS — F32A Depression, unspecified: Secondary | ICD-10-CM | POA: Diagnosis present

## 2022-09-12 DIAGNOSIS — S32591D Other specified fracture of right pubis, subsequent encounter for fracture with routine healing: Secondary | ICD-10-CM | POA: Diagnosis not present

## 2022-09-12 DIAGNOSIS — S8991XA Unspecified injury of right lower leg, initial encounter: Secondary | ICD-10-CM | POA: Diagnosis not present

## 2022-09-12 DIAGNOSIS — I959 Hypotension, unspecified: Secondary | ICD-10-CM | POA: Diagnosis present

## 2022-09-12 DIAGNOSIS — I10 Essential (primary) hypertension: Secondary | ICD-10-CM | POA: Diagnosis present

## 2022-09-12 DIAGNOSIS — E785 Hyperlipidemia, unspecified: Secondary | ICD-10-CM | POA: Diagnosis present

## 2022-09-12 DIAGNOSIS — J690 Pneumonitis due to inhalation of food and vomit: Secondary | ICD-10-CM | POA: Diagnosis not present

## 2022-09-12 DIAGNOSIS — Z041 Encounter for examination and observation following transport accident: Secondary | ICD-10-CM | POA: Diagnosis not present

## 2022-09-12 DIAGNOSIS — R410 Disorientation, unspecified: Principal | ICD-10-CM

## 2022-09-12 DIAGNOSIS — M6281 Muscle weakness (generalized): Secondary | ICD-10-CM | POA: Diagnosis not present

## 2022-09-12 DIAGNOSIS — S8992XA Unspecified injury of left lower leg, initial encounter: Secondary | ICD-10-CM | POA: Diagnosis not present

## 2022-09-12 DIAGNOSIS — M25551 Pain in right hip: Secondary | ICD-10-CM | POA: Diagnosis not present

## 2022-09-12 DIAGNOSIS — S32119A Unspecified Zone I fracture of sacrum, initial encounter for closed fracture: Secondary | ICD-10-CM | POA: Diagnosis present

## 2022-09-12 DIAGNOSIS — T508X5A Adverse effect of diagnostic agents, initial encounter: Secondary | ICD-10-CM | POA: Diagnosis not present

## 2022-09-12 DIAGNOSIS — R1111 Vomiting without nausea: Secondary | ICD-10-CM | POA: Diagnosis not present

## 2022-09-12 DIAGNOSIS — R569 Unspecified convulsions: Secondary | ICD-10-CM | POA: Diagnosis not present

## 2022-09-12 DIAGNOSIS — N1411 Contrast-induced nephropathy: Secondary | ICD-10-CM | POA: Diagnosis not present

## 2022-09-12 DIAGNOSIS — R339 Retention of urine, unspecified: Secondary | ICD-10-CM | POA: Diagnosis present

## 2022-09-12 DIAGNOSIS — I6523 Occlusion and stenosis of bilateral carotid arteries: Secondary | ICD-10-CM | POA: Diagnosis not present

## 2022-09-12 DIAGNOSIS — S3210XD Unspecified fracture of sacrum, subsequent encounter for fracture with routine healing: Secondary | ICD-10-CM | POA: Diagnosis not present

## 2022-09-12 DIAGNOSIS — H9193 Unspecified hearing loss, bilateral: Secondary | ICD-10-CM | POA: Diagnosis present

## 2022-09-12 DIAGNOSIS — R112 Nausea with vomiting, unspecified: Secondary | ICD-10-CM | POA: Diagnosis not present

## 2022-09-12 DIAGNOSIS — R278 Other lack of coordination: Secondary | ICD-10-CM | POA: Diagnosis not present

## 2022-09-12 DIAGNOSIS — K6389 Other specified diseases of intestine: Secondary | ICD-10-CM | POA: Diagnosis not present

## 2022-09-12 DIAGNOSIS — Z79899 Other long term (current) drug therapy: Secondary | ICD-10-CM

## 2022-09-12 DIAGNOSIS — S32401A Unspecified fracture of right acetabulum, initial encounter for closed fracture: Principal | ICD-10-CM | POA: Diagnosis present

## 2022-09-12 DIAGNOSIS — K3189 Other diseases of stomach and duodenum: Secondary | ICD-10-CM | POA: Diagnosis not present

## 2022-09-12 DIAGNOSIS — Z7982 Long term (current) use of aspirin: Secondary | ICD-10-CM

## 2022-09-12 DIAGNOSIS — I252 Old myocardial infarction: Secondary | ICD-10-CM

## 2022-09-12 DIAGNOSIS — R140 Abdominal distension (gaseous): Secondary | ICD-10-CM | POA: Diagnosis not present

## 2022-09-12 DIAGNOSIS — I480 Paroxysmal atrial fibrillation: Secondary | ICD-10-CM | POA: Diagnosis present

## 2022-09-12 DIAGNOSIS — Z87891 Personal history of nicotine dependence: Secondary | ICD-10-CM

## 2022-09-12 DIAGNOSIS — R41841 Cognitive communication deficit: Secondary | ICD-10-CM | POA: Diagnosis not present

## 2022-09-12 DIAGNOSIS — R441 Visual hallucinations: Secondary | ICD-10-CM | POA: Diagnosis not present

## 2022-09-12 DIAGNOSIS — D649 Anemia, unspecified: Secondary | ICD-10-CM | POA: Diagnosis present

## 2022-09-12 DIAGNOSIS — Z8249 Family history of ischemic heart disease and other diseases of the circulatory system: Secondary | ICD-10-CM

## 2022-09-12 DIAGNOSIS — Z888 Allergy status to other drugs, medicaments and biological substances status: Secondary | ICD-10-CM

## 2022-09-12 DIAGNOSIS — N183 Chronic kidney disease, stage 3 unspecified: Secondary | ICD-10-CM | POA: Diagnosis present

## 2022-09-12 DIAGNOSIS — S32810A Multiple fractures of pelvis with stable disruption of pelvic ring, initial encounter for closed fracture: Secondary | ICD-10-CM | POA: Diagnosis present

## 2022-09-12 DIAGNOSIS — E854 Organ-limited amyloidosis: Secondary | ICD-10-CM | POA: Diagnosis present

## 2022-09-12 DIAGNOSIS — S3289XA Fracture of other parts of pelvis, initial encounter for closed fracture: Secondary | ICD-10-CM | POA: Diagnosis not present

## 2022-09-12 DIAGNOSIS — K567 Ileus, unspecified: Secondary | ICD-10-CM | POA: Diagnosis present

## 2022-09-12 DIAGNOSIS — N179 Acute kidney failure, unspecified: Secondary | ICD-10-CM | POA: Diagnosis present

## 2022-09-12 DIAGNOSIS — R2681 Unsteadiness on feet: Secondary | ICD-10-CM | POA: Diagnosis not present

## 2022-09-12 DIAGNOSIS — S3289XD Fracture of other parts of pelvis, subsequent encounter for fracture with routine healing: Secondary | ICD-10-CM | POA: Diagnosis not present

## 2022-09-12 DIAGNOSIS — S3282XA Multiple fractures of pelvis without disruption of pelvic ring, initial encounter for closed fracture: Secondary | ICD-10-CM | POA: Diagnosis not present

## 2022-09-12 DIAGNOSIS — R0602 Shortness of breath: Secondary | ICD-10-CM | POA: Diagnosis not present

## 2022-09-12 DIAGNOSIS — Z7401 Bed confinement status: Secondary | ICD-10-CM | POA: Diagnosis not present

## 2022-09-12 LAB — PROTIME-INR
INR: 1.1 (ref 0.8–1.2)
Prothrombin Time: 14.5 seconds (ref 11.4–15.2)

## 2022-09-12 LAB — I-STAT CHEM 8, ED
BUN: 20 mg/dL (ref 8–23)
Calcium, Ion: 1.12 mmol/L — ABNORMAL LOW (ref 1.15–1.40)
Chloride: 103 mmol/L (ref 98–111)
Creatinine, Ser: 2.1 mg/dL — ABNORMAL HIGH (ref 0.61–1.24)
Glucose, Bld: 157 mg/dL — ABNORMAL HIGH (ref 70–99)
HCT: 36 % — ABNORMAL LOW (ref 39.0–52.0)
Hemoglobin: 12.2 g/dL — ABNORMAL LOW (ref 13.0–17.0)
Potassium: 3.9 mmol/L (ref 3.5–5.1)
Sodium: 137 mmol/L (ref 135–145)
TCO2: 18 mmol/L — ABNORMAL LOW (ref 22–32)

## 2022-09-12 LAB — COMPREHENSIVE METABOLIC PANEL
ALT: 29 U/L (ref 0–44)
AST: 28 U/L (ref 15–41)
Albumin: 3.5 g/dL (ref 3.5–5.0)
Alkaline Phosphatase: 51 U/L (ref 38–126)
Anion gap: 16 — ABNORMAL HIGH (ref 5–15)
BUN: 20 mg/dL (ref 8–23)
CO2: 17 mmol/L — ABNORMAL LOW (ref 22–32)
Calcium: 9.1 mg/dL (ref 8.9–10.3)
Chloride: 102 mmol/L (ref 98–111)
Creatinine, Ser: 1.91 mg/dL — ABNORMAL HIGH (ref 0.61–1.24)
GFR, Estimated: 36 mL/min — ABNORMAL LOW (ref >60)
Glucose, Bld: 161 mg/dL — ABNORMAL HIGH (ref 70–99)
Potassium: 3.9 mmol/L (ref 3.5–5.1)
Sodium: 135 mmol/L (ref 135–145)
Total Bilirubin: 1.3 mg/dL — ABNORMAL HIGH (ref 0.3–1.2)
Total Protein: 6.1 g/dL — ABNORMAL LOW (ref 6.5–8.1)

## 2022-09-12 LAB — DIFFERENTIAL
Abs Immature Granulocytes: 0.1 10*3/uL — ABNORMAL HIGH (ref 0.00–0.07)
Basophils Absolute: 0.1 10*3/uL (ref 0.0–0.1)
Basophils Relative: 0 %
Eosinophils Absolute: 0.2 10*3/uL (ref 0.0–0.5)
Eosinophils Relative: 1 %
Immature Granulocytes: 1 %
Lymphocytes Relative: 8 %
Lymphs Abs: 1.4 10*3/uL (ref 0.7–4.0)
Monocytes Absolute: 0.8 10*3/uL (ref 0.1–1.0)
Monocytes Relative: 5 %
Neutro Abs: 14.5 10*3/uL — ABNORMAL HIGH (ref 1.7–7.7)
Neutrophils Relative %: 85 %

## 2022-09-12 LAB — ETHANOL: Alcohol, Ethyl (B): <10 mg/dL (ref <10)

## 2022-09-12 LAB — CBC
HCT: 35.3 % — ABNORMAL LOW (ref 39.0–52.0)
Hemoglobin: 12.2 g/dL — ABNORMAL LOW (ref 13.0–17.0)
MCH: 35 pg — ABNORMAL HIGH (ref 26.0–34.0)
MCHC: 34.6 g/dL (ref 30.0–36.0)
MCV: 101.1 fL — ABNORMAL HIGH (ref 80.0–100.0)
Platelets: 214 10*3/uL (ref 150–400)
RBC: 3.49 MIL/uL — ABNORMAL LOW (ref 4.22–5.81)
RDW: 13.2 % (ref 11.5–15.5)
WBC: 17.1 10*3/uL — ABNORMAL HIGH (ref 4.0–10.5)
nRBC: 0 % (ref 0.0–0.2)

## 2022-09-12 LAB — APTT: aPTT: 28 seconds (ref 24–36)

## 2022-09-12 MED ORDER — IOHEXOL 350 MG/ML SOLN
150.0000 mL | Freq: Once | INTRAVENOUS | Status: AC | PRN
  Administered 2022-09-12: 150 mL via INTRAVENOUS

## 2022-09-12 MED ORDER — HALOPERIDOL LACTATE 5 MG/ML IJ SOLN: 2.0000 mg | Freq: Once | INTRAMUSCULAR | Status: DC

## 2022-09-12 MED ORDER — HALOPERIDOL LACTATE 5 MG/ML IJ SOLN
5.0000 mg | Freq: Once | INTRAMUSCULAR | Status: AC
  Administered 2022-09-12: 5 mg via INTRAVENOUS
  Filled 2022-09-12: qty 1

## 2022-09-12 MED ORDER — NOREPINEPHRINE 4 MG/250ML-% IV SOLN
2.0000 ug/min | INTRAVENOUS | Status: DC
  Administered 2022-09-12: 5 ug/min via INTRAVENOUS

## 2022-09-12 MED ORDER — SODIUM CHLORIDE 0.9 % IV SOLN: 250.0000 mL | INTRAVENOUS | Status: DC

## 2022-09-12 MED ORDER — LACTATED RINGERS IV BOLUS
1000.0000 mL | Freq: Once | INTRAVENOUS | Status: AC
  Administered 2022-09-12: 1000 mL via INTRAVENOUS

## 2022-09-12 MED ORDER — HALOPERIDOL LACTATE 5 MG/ML IJ SOLN
2.0000 mg | Freq: Once | INTRAMUSCULAR | Status: AC
  Administered 2022-09-12: 2 mg via INTRAVENOUS
  Filled 2022-09-12: qty 1

## 2022-09-12 MED ORDER — HYDROMORPHONE HCL 1 MG/ML IJ SOLN
1.0000 mg | Freq: Once | INTRAMUSCULAR | Status: AC
  Administered 2022-09-13: 1 mg via INTRAVENOUS
  Filled 2022-09-12: qty 1

## 2022-09-12 MED ORDER — FENTANYL CITRATE PF 50 MCG/ML IJ SOSY
25.0000 ug | PREFILLED_SYRINGE | Freq: Once | INTRAMUSCULAR | Status: AC
  Administered 2022-09-12: 25 ug via INTRAVENOUS
  Filled 2022-09-12: qty 1
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
## 2022-09-12 NOTE — ED Notes (Signed)
Pt in MRI/ Seth,RN w/ pt

## 2022-09-13 ENCOUNTER — Inpatient Hospital Stay (HOSPITAL_COMMUNITY): Payer: PPO

## 2022-09-13 ENCOUNTER — Encounter (HOSPITAL_COMMUNITY): Payer: Self-pay

## 2022-09-13 DIAGNOSIS — N179 Acute kidney failure, unspecified: Secondary | ICD-10-CM | POA: Diagnosis present

## 2022-09-13 DIAGNOSIS — S32119A Unspecified Zone I fracture of sacrum, initial encounter for closed fracture: Secondary | ICD-10-CM | POA: Diagnosis present

## 2022-09-13 DIAGNOSIS — S32401A Unspecified fracture of right acetabulum, initial encounter for closed fracture: Secondary | ICD-10-CM | POA: Diagnosis present

## 2022-09-13 DIAGNOSIS — I129 Hypertensive chronic kidney disease with stage 1 through stage 4 chronic kidney disease, or unspecified chronic kidney disease: Secondary | ICD-10-CM | POA: Diagnosis present

## 2022-09-13 DIAGNOSIS — S32810A Multiple fractures of pelvis with stable disruption of pelvic ring, initial encounter for closed fracture: Secondary | ICD-10-CM

## 2022-09-13 DIAGNOSIS — S3282XA Multiple fractures of pelvis without disruption of pelvic ring, initial encounter for closed fracture: Secondary | ICD-10-CM

## 2022-09-13 DIAGNOSIS — F32A Depression, unspecified: Secondary | ICD-10-CM | POA: Diagnosis present

## 2022-09-13 DIAGNOSIS — R569 Unspecified convulsions: Secondary | ICD-10-CM | POA: Diagnosis not present

## 2022-09-13 DIAGNOSIS — E854 Organ-limited amyloidosis: Secondary | ICD-10-CM

## 2022-09-13 DIAGNOSIS — N183 Chronic kidney disease, stage 3 unspecified: Secondary | ICD-10-CM

## 2022-09-13 DIAGNOSIS — R410 Disorientation, unspecified: Secondary | ICD-10-CM

## 2022-09-13 DIAGNOSIS — D649 Anemia, unspecified: Secondary | ICD-10-CM | POA: Diagnosis present

## 2022-09-13 DIAGNOSIS — G9341 Metabolic encephalopathy: Secondary | ICD-10-CM | POA: Diagnosis present

## 2022-09-13 DIAGNOSIS — J690 Pneumonitis due to inhalation of food and vomit: Secondary | ICD-10-CM | POA: Diagnosis not present

## 2022-09-13 DIAGNOSIS — S32402A Unspecified fracture of left acetabulum, initial encounter for closed fracture: Secondary | ICD-10-CM | POA: Diagnosis present

## 2022-09-13 DIAGNOSIS — I680 Cerebral amyloid angiopathy: Secondary | ICD-10-CM

## 2022-09-13 DIAGNOSIS — R440 Auditory hallucinations: Secondary | ICD-10-CM | POA: Diagnosis not present

## 2022-09-13 DIAGNOSIS — Z87891 Personal history of nicotine dependence: Secondary | ICD-10-CM | POA: Diagnosis not present

## 2022-09-13 DIAGNOSIS — R4182 Altered mental status, unspecified: Secondary | ICD-10-CM

## 2022-09-13 DIAGNOSIS — I2510 Atherosclerotic heart disease of native coronary artery without angina pectoris: Secondary | ICD-10-CM | POA: Diagnosis not present

## 2022-09-13 DIAGNOSIS — S329XXA Fracture of unspecified parts of lumbosacral spine and pelvis, initial encounter for closed fracture: Secondary | ICD-10-CM

## 2022-09-13 DIAGNOSIS — N1411 Contrast-induced nephropathy: Secondary | ICD-10-CM | POA: Diagnosis not present

## 2022-09-13 DIAGNOSIS — T508X5A Adverse effect of diagnostic agents, initial encounter: Secondary | ICD-10-CM | POA: Diagnosis not present

## 2022-09-13 DIAGNOSIS — K567 Ileus, unspecified: Secondary | ICD-10-CM | POA: Diagnosis present

## 2022-09-13 DIAGNOSIS — R066 Hiccough: Secondary | ICD-10-CM | POA: Diagnosis present

## 2022-09-13 DIAGNOSIS — Z85820 Personal history of malignant melanoma of skin: Secondary | ICD-10-CM | POA: Diagnosis not present

## 2022-09-13 DIAGNOSIS — I480 Paroxysmal atrial fibrillation: Secondary | ICD-10-CM | POA: Diagnosis present

## 2022-09-13 DIAGNOSIS — N1831 Chronic kidney disease, stage 3a: Secondary | ICD-10-CM | POA: Diagnosis present

## 2022-09-13 DIAGNOSIS — Y92481 Parking lot as the place of occurrence of the external cause: Secondary | ICD-10-CM | POA: Diagnosis not present

## 2022-09-13 DIAGNOSIS — R339 Retention of urine, unspecified: Secondary | ICD-10-CM | POA: Diagnosis present

## 2022-09-13 DIAGNOSIS — I959 Hypotension, unspecified: Secondary | ICD-10-CM | POA: Diagnosis present

## 2022-09-13 DIAGNOSIS — E785 Hyperlipidemia, unspecified: Secondary | ICD-10-CM | POA: Diagnosis present

## 2022-09-13 LAB — CBC
HCT: 33.4 % — ABNORMAL LOW (ref 39.0–52.0)
HCT: 30.8 % — ABNORMAL LOW (ref 39.0–52.0)
HCT: 31 % — ABNORMAL LOW (ref 39.0–52.0)
Hemoglobin: 11.1 g/dL — ABNORMAL LOW (ref 13.0–17.0)
Hemoglobin: 10.7 g/dL — ABNORMAL LOW (ref 13.0–17.0)
Hemoglobin: 11 g/dL — ABNORMAL LOW (ref 13.0–17.0)
MCH: 34.3 pg — ABNORMAL HIGH (ref 26.0–34.0)
MCH: 35.4 pg — ABNORMAL HIGH (ref 26.0–34.0)
MCH: 35.6 pg — ABNORMAL HIGH (ref 26.0–34.0)
MCHC: 33.2 g/dL (ref 30.0–36.0)
MCHC: 34.7 g/dL (ref 30.0–36.0)
MCHC: 35.5 g/dL (ref 30.0–36.0)
MCV: 103.1 fL — ABNORMAL HIGH (ref 80.0–100.0)
MCV: 102 fL — ABNORMAL HIGH (ref 80.0–100.0)
MCV: 100.3 fL — ABNORMAL HIGH (ref 80.0–100.0)
Platelets: 206 10*3/uL (ref 150–400)
Platelets: 194 10*3/uL (ref 150–400)
Platelets: 185 10*3/uL (ref 150–400)
RBC: 3.24 MIL/uL — ABNORMAL LOW (ref 4.22–5.81)
RBC: 3.02 MIL/uL — ABNORMAL LOW (ref 4.22–5.81)
RBC: 3.09 MIL/uL — ABNORMAL LOW (ref 4.22–5.81)
RDW: 13.7 % (ref 11.5–15.5)
RDW: 13.7 % (ref 11.5–15.5)
RDW: 13.7 % (ref 11.5–15.5)
WBC: 17.8 10*3/uL — ABNORMAL HIGH (ref 4.0–10.5)
WBC: 16.4 10*3/uL — ABNORMAL HIGH (ref 4.0–10.5)
WBC: 14.9 10*3/uL — ABNORMAL HIGH (ref 4.0–10.5)
nRBC: 0 % (ref 0.0–0.2)
nRBC: 0 % (ref 0.0–0.2)
nRBC: 0 % (ref 0.0–0.2)

## 2022-09-13 LAB — COMPREHENSIVE METABOLIC PANEL
ALT: 30 U/L (ref 0–44)
AST: 26 U/L (ref 15–41)
Albumin: 3.3 g/dL — ABNORMAL LOW (ref 3.5–5.0)
Alkaline Phosphatase: 47 U/L (ref 38–126)
Anion gap: 14 (ref 5–15)
BUN: 28 mg/dL — ABNORMAL HIGH (ref 8–23)
CO2: 18 mmol/L — ABNORMAL LOW (ref 22–32)
Calcium: 8.3 mg/dL — ABNORMAL LOW (ref 8.9–10.3)
Chloride: 102 mmol/L (ref 98–111)
Creatinine, Ser: 2.28 mg/dL — ABNORMAL HIGH (ref 0.61–1.24)
GFR, Estimated: 29 mL/min — ABNORMAL LOW (ref >60)
Glucose, Bld: 156 mg/dL — ABNORMAL HIGH (ref 70–99)
Potassium: 5.1 mmol/L (ref 3.5–5.1)
Sodium: 134 mmol/L — ABNORMAL LOW (ref 135–145)
Total Bilirubin: 1.6 mg/dL — ABNORMAL HIGH (ref 0.3–1.2)
Total Protein: 5.8 g/dL — ABNORMAL LOW (ref 6.5–8.1)

## 2022-09-13 LAB — URINALYSIS, ROUTINE W REFLEX MICROSCOPIC
Bacteria, UA: NONE SEEN
Bilirubin Urine: NEGATIVE
Glucose, UA: NEGATIVE mg/dL
Hgb urine dipstick: NEGATIVE
Ketones, ur: NEGATIVE mg/dL
Leukocytes,Ua: NEGATIVE
Nitrite: NEGATIVE
Protein, ur: 30 mg/dL — AB
Specific Gravity, Urine: >1.046 — ABNORMAL HIGH (ref 1.005–1.030)
pH: 5 (ref 5.0–8.0)

## 2022-09-13 LAB — HEMOGLOBIN AND HEMATOCRIT, BLOOD
HCT: 32.1 % — ABNORMAL LOW (ref 39.0–52.0)
HCT: 27.8 % — ABNORMAL LOW (ref 39.0–52.0)
HCT: 24.5 % — ABNORMAL LOW (ref 39.0–52.0)
Hemoglobin: 10.5 g/dL — ABNORMAL LOW (ref 13.0–17.0)
Hemoglobin: 9.2 g/dL — ABNORMAL LOW (ref 13.0–17.0)
Hemoglobin: 8.6 g/dL — ABNORMAL LOW (ref 13.0–17.0)

## 2022-09-13 LAB — FOLATE: Folate: 11.2 ng/mL (ref >5.9)

## 2022-09-13 LAB — VITAMIN B12: Vitamin B-12: 1316 pg/mL — ABNORMAL HIGH (ref 180–914)

## 2022-09-13 LAB — LACTIC ACID, PLASMA
Lactic Acid, Venous: 2.1 mmol/L (ref 0.5–1.9)
Lactic Acid, Venous: 2.2 mmol/L (ref 0.5–1.9)

## 2022-09-13 LAB — RAPID URINE DRUG SCREEN, HOSP PERFORMED
Amphetamines: NOT DETECTED
Barbiturates: NOT DETECTED
Benzodiazepines: NOT DETECTED
Cocaine: NOT DETECTED
Opiates: NOT DETECTED
Tetrahydrocannabinol: NOT DETECTED

## 2022-09-13 LAB — CK: Total CK: 534 U/L — ABNORMAL HIGH (ref 49–397)

## 2022-09-13 LAB — RPR: RPR Ser Ql: NONREACTIVE

## 2022-09-13 LAB — TSH: TSH: 3.84 u[IU]/mL (ref 0.350–4.500)

## 2022-09-13 MED ORDER — CARVEDILOL 3.125 MG PO TABS: 6.2500 mg | ORAL_TABLET | Freq: Two times a day (BID) | ORAL | Status: DC

## 2022-09-13 MED ORDER — HALOPERIDOL LACTATE 5 MG/ML IJ SOLN
5.0000 mg | Freq: Four times a day (QID) | INTRAMUSCULAR | Status: DC | PRN
  Administered 2022-09-13 – 2022-09-15 (×5): 5 mg via INTRAVENOUS
  Filled 2022-09-13 (×6): qty 1

## 2022-09-13 MED ORDER — SODIUM CHLORIDE 0.9 % IV SOLN: INTRAVENOUS | Status: AC

## 2022-09-13 MED ORDER — ACETAMINOPHEN 650 MG RE SUPP: 650.0000 mg | Freq: Four times a day (QID) | RECTAL | Status: DC | PRN

## 2022-09-13 MED ORDER — SODIUM CHLORIDE 0.9 % IV BOLUS
500.0000 mL | Freq: Once | INTRAVENOUS | Status: AC
  Administered 2022-09-13: 500 mL via INTRAVENOUS

## 2022-09-13 MED ORDER — SENNOSIDES-DOCUSATE SODIUM 8.6-50 MG PO TABS
1.0000 | ORAL_TABLET | Freq: Every evening | ORAL | Status: DC | PRN
  Administered 2022-09-15 – 2022-09-17 (×3): 1 via ORAL
  Filled 2022-09-13 (×3): qty 1

## 2022-09-13 MED ORDER — ACETAMINOPHEN 325 MG PO TABS
650.0000 mg | ORAL_TABLET | Freq: Four times a day (QID) | ORAL | Status: DC | PRN
  Administered 2022-09-14 – 2022-09-23 (×11): 650 mg via ORAL
  Filled 2022-09-13 (×11): qty 2

## 2022-09-13 MED ORDER — OXYCODONE HCL 5 MG PO TABS
5.0000 mg | ORAL_TABLET | ORAL | Status: DC | PRN
  Administered 2022-09-14: 5 mg via ORAL
  Filled 2022-09-13: qty 1

## 2022-09-13 MED ORDER — ONDANSETRON HCL 4 MG PO TABS: 4.0000 mg | ORAL_TABLET | Freq: Four times a day (QID) | ORAL | Status: DC | PRN

## 2022-09-13 MED ORDER — ATORVASTATIN CALCIUM 40 MG PO TABS: 80.0000 mg | ORAL_TABLET | Freq: Every day | ORAL | Status: DC

## 2022-09-13 MED ORDER — HYDROMORPHONE HCL 1 MG/ML IJ SOLN
1.0000 mg | INTRAMUSCULAR | Status: DC | PRN
  Administered 2022-09-13 – 2022-09-15 (×7): 1 mg via INTRAVENOUS
  Filled 2022-09-13 (×7): qty 1

## 2022-09-13 MED ORDER — ONDANSETRON HCL 4 MG/2ML IJ SOLN
4.0000 mg | Freq: Four times a day (QID) | INTRAMUSCULAR | Status: DC | PRN
  Administered 2022-09-13: 4 mg via INTRAVENOUS
  Filled 2022-09-13: qty 2

## 2022-09-13 MED ORDER — HEPARIN SODIUM (PORCINE) 5000 UNIT/ML IJ SOLN: 5000.0000 [IU] | Freq: Three times a day (TID) | INTRAMUSCULAR | Status: DC
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
## 2022-09-13 NOTE — Progress Notes (Signed)
Attempted to reach EEG dept 2 X, no answer. Left voice message. 

## 2022-09-13 NOTE — TOC CAGE-AID Note (Signed)
Transition of Care [facility]) - CAGE-AID Screening   Patient Details  Name: Jesse [name] MRN: [id number] Date of Birth: [birth year]  [facility] of [facility] CM/SW Contact:    Erin Sons, LCSW Phone Number: 09/13/2022, 3:46 PM   CAGE-AID Screening:    Have You Ever Felt You Ought to Cut Down on Your Drinking or Drug Use?: No Have People Annoyed You By [employer] Your Drinking Or Drug Use?: No Have You Felt Bad Or Guilty About Your Drinking Or Drug Use?: No Have You Ever Had a Drink or Used Drugs First Thing In The Morning to Steady Your Nerves or to Get Rid of a Hangover?: No CAGE-AID Score: 0  Substance Abuse Education Offered: No
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
## 2022-09-13 NOTE — ED Notes (Signed)
EEG at bedside.
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
## 2022-09-13 NOTE — Progress Notes (Signed)
vLTM started   All impedances below 10khoms.  [facility] not monitoring this pt while still In ED bed.
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
## 2022-09-13 NOTE — Progress Notes (Signed)
Attempted to reach EEG dept 3X, no answer. Left VM. Pt not currently attached to EEG machine upon arrival to unit.

## 2022-09-13 NOTE — ED Notes (Signed)
Pt removed from O2 sats remain 99-100% on room air

## 2022-09-13 NOTE — ED Notes (Signed)
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 vertebrobasilar junction. Atherosclerotic plaque within the left V4 segment with no more than mild stenosis. The basilar artery is patent. The posterior cerebral arteries are patent. Posterior communicating arteries are present bilaterally. Venous sinuses: Limited evaluation for dural venous sinus thrombosis due to contrast timing. Anatomic variants: As described. Review of the MIP images confirms the above findings These results were called by telephone at the time of interpretation on 09/12/2022 at 7:00 pm to provider Dr. Wilford Corner, who verbally acknowledged these results. IMPRESSION: CTA neck: 1. The common carotid and internal carotid arteries are patent within the neck. Atherosclerotic plaque bilaterally, as described. There is likely some degree of atherosclerotic stenosis at the  origin of the right ICA. However, this stenosis is poorly quantified due to motion artifact and blooming artifact from calcified plaque at this site. Consider a carotid artery duplex for further evaluation. 2. The dominant left vertebral artery is patent within the neck. There is at least moderate atherosclerotic narrowing at the origin of this vessel. 3. The right vertebral artery is developmentally diminutive, but patent throughout the neck. 4.  Aortic Atherosclerosis (ICD10-I70.0). 5. Cervical spondylosis as described. Most notably, posterior disc osteophyte complexes contribute to spinal canal stenosis which is at least moderate, and may be severe, at C5-C6 and C6-C7. CTA head: 1. No intracranial large vessel occlusion identified. 2. Intracranial atherosclerotic disease with multifocal stenoses, most notably as follows. 3. Up to moderate stenosis within the paraclinoid right ICA. 4. Severe stenosis within the non dominant right vertebral artery (just proximal to the vertebrobasilar junction). Electronically Signed   By: Jackey Loge D.O.   On: 09/12/2022 19:32   CT L-SPINE NO CHARGE  Result Date: 09/12/2022 CLINICAL DATA:  Motor EXAM: CT CERVICAL, THORACIC, AND LUMBAR SPINE WITHOUT CONTRAST TECHNIQUE: Multidetector CT imaging of the cervical, thoracic and lumbar spine was constructed from concomitant CT chest abdomen pelvis. RADIATION DOSE REDUCTION: This exam was performed according to the departmental dose-optimization program which includes automated exposure control, adjustment of the mA and/or kV according to patient size and/or use of iterative reconstruction technique. COMPARISON:  None Available. FINDINGS: CT CERVICAL SPINE FINDINGS Alignment: Grade 1 anterolisthesis at C4-5 Skull base and vertebrae: No acute fracture. Soft tissues and spinal canal: No prevertebral fluid or swelling. No visible canal hematoma. Disc levels: C5-7 disc space narrowing and endplate spurring. No high-grade spinal canal  stenosis. Upper chest: Negative CT THORACIC SPINE FINDINGS Alignment: Normal. Vertebrae: No acute fracture or focal pathologic process. Paraspinal and other soft tissues: Negative. Disc levels: No spinal canal stenosis CT LUMBAR SPINE FINDINGS Segmentation: 5 lumbar type vertebrae. Alignment: Grade 1 anterolisthesis at L4-5 Vertebrae: No acute fracture or focal pathologic process. Paraspinal and other soft tissues: Calcific aortic atherosclerosis Disc levels: Severe bilateral L4-5 facet arthrosis with  moderate bilateral L4 foraminal stenosis. No spinal canal stenosis. IMPRESSION: 1. No acute fracture or traumatic listhesis of the cervical, thoracic or lumbar spine. 2. Moderate bilateral L4 foraminal stenosis. Aortic Atherosclerosis (ICD10-I70.0). Electronically Signed   By: Deatra Robinson M.D.   On: 09/12/2022 19:20   CT T-SPINE NO CHARGE  Result Date: 09/12/2022 CLINICAL DATA:  Motor EXAM: CT CERVICAL, THORACIC, AND LUMBAR SPINE WITHOUT CONTRAST TECHNIQUE: Multidetector CT imaging of the cervical, thoracic and lumbar spine was constructed from concomitant CT chest abdomen pelvis. RADIATION DOSE REDUCTION: This exam was performed according to the departmental dose-optimization program which includes automated exposure control, adjustment of the mA and/or kV according to patient size and/or use of iterative reconstruction technique. COMPARISON:  None Available. FINDINGS: CT CERVICAL SPINE FINDINGS Alignment: Grade 1 anterolisthesis at C4-5 Skull base and vertebrae: No acute fracture. Soft tissues and spinal canal: No prevertebral fluid or swelling. No visible canal hematoma. Disc levels: C5-7 disc space narrowing and endplate spurring. No high-grade spinal canal stenosis. Upper chest: Negative CT THORACIC SPINE FINDINGS Alignment: Normal. Vertebrae: No acute fracture or focal pathologic process. Paraspinal and other soft tissues: Negative. Disc levels: No spinal canal stenosis CT LUMBAR SPINE FINDINGS Segmentation:  5 lumbar type vertebrae. Alignment: Grade 1 anterolisthesis at L4-5 Vertebrae: No acute fracture or focal pathologic process. Paraspinal and other soft tissues: Calcific aortic atherosclerosis Disc levels: Severe bilateral L4-5 facet arthrosis with moderate bilateral L4 foraminal stenosis. No spinal canal stenosis. IMPRESSION: 1. No acute fracture or traumatic listhesis of the cervical, thoracic or lumbar spine. 2. Moderate bilateral L4 foraminal stenosis. Aortic Atherosclerosis (ICD10-I70.0). Electronically Signed   By: Deatra Robinson M.D.   On: 09/12/2022 19:20   CT C-SPINE NO CHARGE  Result Date: 09/12/2022 CLINICAL DATA:  Motor EXAM: CT CERVICAL, THORACIC, AND LUMBAR SPINE WITHOUT CONTRAST TECHNIQUE: Multidetector CT imaging of the cervical, thoracic and lumbar spine was constructed from concomitant CT chest abdomen pelvis. RADIATION DOSE REDUCTION: This exam was performed according to the departmental dose-optimization program which includes automated exposure control, adjustment of the mA and/or kV according to patient size and/or use of iterative reconstruction technique. COMPARISON:  None Available. FINDINGS: CT CERVICAL SPINE FINDINGS Alignment: Grade 1 anterolisthesis at C4-5 Skull base and vertebrae: No acute fracture. Soft tissues and spinal canal: No prevertebral fluid or swelling. No visible canal hematoma. Disc levels: C5-7 disc space narrowing and endplate spurring. No high-grade spinal canal stenosis. Upper chest: Negative CT THORACIC SPINE FINDINGS Alignment: Normal. Vertebrae: No acute fracture or focal pathologic process. Paraspinal and other soft tissues: Negative. Disc levels: No spinal canal stenosis CT LUMBAR SPINE FINDINGS Segmentation: 5 lumbar type vertebrae. Alignment: Grade 1 anterolisthesis at L4-5 Vertebrae: No acute fracture or focal pathologic process. Paraspinal and other soft tissues: Calcific aortic atherosclerosis Disc levels: Severe bilateral L4-5 facet arthrosis with moderate  bilateral L4 foraminal stenosis. No spinal canal stenosis. IMPRESSION: 1. No acute fracture or traumatic listhesis of the cervical, thoracic or lumbar spine. 2. Moderate bilateral L4 foraminal stenosis. Aortic Atherosclerosis (ICD10-I70.0). Electronically Signed   By: Deatra Robinson M.D.   On: 09/12/2022 19:20   CT HEAD CODE STROKE WO CONTRAST  Result Date: 09/12/2022 CLINICAL DATA:  Provided history: Neuro deficit, acute, stroke suspected. Altered mental status. EXAM: CT HEAD WITHOUT CONTRAST TECHNIQUE: Contiguous axial images were obtained from the base of the skull through the vertex without intravenous contrast. RADIATION DOSE REDUCTION: This exam was performed according to the departmental dose-optimization program which includes automated exposure control, adjustment of the  mA and/or kV according to patient size and/or use of iterative reconstruction technique. COMPARISON:  Brain MRI 03/04/2020. Head CT 03/04/2020. FINDINGS: Brain: Moderate cerebral atrophy. Apparent focus of asymmetric hypodensity within the right aspect of the pons (for instance as seen on series 6, image 30) (series 5, image 40). Patchy and ill-defined hypoattenuation within the cerebral white matter, nonspecific but compatible with moderate chronic small vessel ischemic disease. There is no acute intracranial hemorrhage. No demarcated cortical infarct. No extra-axial fluid collection. No evidence of an intracranial mass. No midline shift. Vascular: No hyperdense vessel. Atherosclerotic calcifications. Skull: No fracture or aggressive osseous lesion. Sinuses/Orbits: No mass or acute finding within the imaged orbits. No significant paranasal sinus disease. ASPECTS [facility] Stroke Program Early CT Score) - Ganglionic level infarction (caudate, lentiform nuclei, internal capsule, insula, M1-M3 cortex): 7 - Supraganglionic infarction (M4-M6 cortex): 3 Total score (0-10 with 10 being normal): 10 These results were communicated to Dr. Wilford Corner At  6:53 pmon 5/2/2024by text page via the [facility] messaging system. IMPRESSION: 1. Focus of apparent asymmetric hypodensity within the right aspect of the pons. This may reflect artifact. However, if there is clinical concern for an acute right pontine infarct, a brain MRI should be considered for further evaluation. 2. No evidence of acute intracranial hemorrhage. 3. No acute demarcated cortical infarct. 4. Moderate chronic small vessel ischemic changes within the cerebral white matter. 5. Moderate cerebral atrophy. Electronically Signed   By: Jackey Loge D.O.   On: 09/12/2022 19:00    Pending Labs Unresulted Labs (From admission, onward)     Start     Ordered   09/13/22 0900  Hemoglobin and hematocrit, blood  Now then every 6 hours,   R (with TIMED occurrences)      09/13/22 0527   09/13/22 0524  Culture, blood (Routine X 2) w Reflex to ID Panel  BLOOD CULTURE X 2,   R (with TIMED occurrences)      09/13/22 0523   09/12/22 1824  Urine rapid drug screen (hosp performed)  Once,   STAT        09/12/22 1823   09/12/22 1824  Urinalysis, Routine w reflex microscopic -Urine, Clean Catch  Once,   URGENT       Question:  Specimen Source  Answer:  Urine, Clean Catch   09/12/22 1823            Vitals/Pain Today's Vitals   09/13/22 0915 09/13/22 0951 09/13/22 1107 09/13/22 1125  BP: 108/64     Pulse: 91     Resp: 17     Temp:   98.6 F (37 C)   TempSrc:   Oral   SpO2: 100%     Weight:      Height:      PainSc:  5   Asleep    Isolation Precautions No active isolations  Medications Medications  0.9 %  sodium chloride infusion (250 mLs Intravenous Not Given 09/12/22 2204)  norepinephrine (LEVOPHED) 4mg  in (0.016 mg/mL) premix infusion (0 mcg/min Intravenous Paused 09/13/22 0436)  HYDROmorphone (DILAUDID) injection 1 mg (1 mg Intravenous Given 09/13/22 0842)  acetaminophen (TYLENOL) tablet 650 mg (has no administration in time range)    Or  acetaminophen (TYLENOL) suppository 650 mg (has  no administration in time range)  ondansetron (ZOFRAN) tablet 4 mg ( Oral See Alternative 09/13/22 0419)    Or  ondansetron (ZOFRAN) injection 4 mg (4 mg Intravenous Given 09/13/22 0419)  senna-docusate (Senokot-S) tablet 1 tablet (has no  administration in time range)  oxyCODONE (Oxy IR/ROXICODONE) immediate release tablet 5 mg (has no administration in time range)  0.9 %  sodium chloride infusion ( Intravenous Rate/Dose Change 09/13/22 0602)  lactated ringers bolus 1,000 mL (0 mLs Intravenous Stopped 09/12/22 2339)  iohexol (OMNIPAQUE) 350 MG/ML injection 150 mL (150 mLs Intravenous Contrast Given 09/12/22 1905)  haloperidol lactate (HALDOL) injection 2 mg (2 mg Intravenous Given 09/12/22 2044)  fentaNYL (SUBLIMAZE) injection 25 mcg (25 mcg Intravenous Given 09/12/22 2043)  haloperidol lactate (HALDOL) injection 5 mg (5 mg Intravenous Given 09/12/22 2117)  HYDROmorphone (DILAUDID) injection 1 mg (1 mg Intravenous Given 09/13/22 0021)  sodium chloride 0.9 % bolus 500 mL (0 mLs Intravenous Stopped 09/13/22 0436)  sodium chloride 0.9 % bolus 500 mL (0 mLs Intravenous Stopped 09/13/22 0558)    Mobility Walks, currently on bedrest d/t pelvic fractures     Focused Assessments    R Recommendations: See Admitting Provider Note  Report given to:   Additional Notes: Patient is currently on bedrest d/t pelvic fractures. Has not urinated since arrival however bladder scan only revealed , order to I/O after on scan. Patient is alert and oriented but intermittently confused. Family at bedside, EEG present and attached.
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
## 2022-09-13 NOTE — Plan of Care (Signed)
Per my signout from my overnight covering colleague, there was concern for "spells" where he was confused. LTM EEG has been ordered MRI brain negative for stroke.  Numerous chronic microhemorrhages consistent with several amyloid angiopathy. I would avoid any sort of anticoagulation or antiplatelets. I will round on him after availability of the above neurodiagnostic test. Also AKI-management per primary team. Fractures management per trauma team No need for stroke workup  Spells likely what is referred to as amyloid spells (also referred as TFNE - transient focal neurological events)   -- Milon Dikes, MD Neurologist [facility] Pager: [phone]
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
## 2022-09-14 ENCOUNTER — Inpatient Hospital Stay (HOSPITAL_COMMUNITY): Payer: PPO

## 2022-09-14 ENCOUNTER — Other Ambulatory Visit (HOSPITAL_COMMUNITY): Payer: PPO

## 2022-09-14 DIAGNOSIS — I2510 Atherosclerotic heart disease of native coronary artery without angina pectoris: Secondary | ICD-10-CM | POA: Diagnosis not present

## 2022-09-14 DIAGNOSIS — R569 Unspecified convulsions: Secondary | ICD-10-CM | POA: Diagnosis not present

## 2022-09-14 DIAGNOSIS — S3282XA Multiple fractures of pelvis without disruption of pelvic ring, initial encounter for closed fracture: Secondary | ICD-10-CM | POA: Diagnosis not present

## 2022-09-14 DIAGNOSIS — I680 Cerebral amyloid angiopathy: Secondary | ICD-10-CM | POA: Diagnosis not present

## 2022-09-14 DIAGNOSIS — R410 Disorientation, unspecified: Secondary | ICD-10-CM | POA: Diagnosis not present

## 2022-09-14 DIAGNOSIS — E854 Organ-limited amyloidosis: Secondary | ICD-10-CM | POA: Diagnosis not present

## 2022-09-14 DIAGNOSIS — R4182 Altered mental status, unspecified: Secondary | ICD-10-CM | POA: Diagnosis not present

## 2022-09-14 LAB — CBC
HCT: 24.4 % — ABNORMAL LOW (ref 39.0–52.0)
HCT: 22 % — ABNORMAL LOW (ref 39.0–52.0)
Hemoglobin: 8.3 g/dL — ABNORMAL LOW (ref 13.0–17.0)
Hemoglobin: 7.9 g/dL — ABNORMAL LOW (ref 13.0–17.0)
MCH: 35 pg — ABNORMAL HIGH (ref 26.0–34.0)
MCH: 36.1 pg — ABNORMAL HIGH (ref 26.0–34.0)
MCHC: 34 g/dL (ref 30.0–36.0)
MCHC: 35.9 g/dL (ref 30.0–36.0)
MCV: 103 fL — ABNORMAL HIGH (ref 80.0–100.0)
MCV: 100.5 fL — ABNORMAL HIGH (ref 80.0–100.0)
Platelets: 142 10*3/uL — ABNORMAL LOW (ref 150–400)
Platelets: 126 10*3/uL — ABNORMAL LOW (ref 150–400)
RBC: 2.37 MIL/uL — ABNORMAL LOW (ref 4.22–5.81)
RBC: 2.19 MIL/uL — ABNORMAL LOW (ref 4.22–5.81)
RDW: 14.2 % (ref 11.5–15.5)
RDW: 14.1 % (ref 11.5–15.5)
WBC: 10.3 10*3/uL (ref 4.0–10.5)
WBC: 9 10*3/uL (ref 4.0–10.5)
nRBC: 0 % (ref 0.0–0.2)
nRBC: 0 % (ref 0.0–0.2)

## 2022-09-14 LAB — COMPREHENSIVE METABOLIC PANEL
ALT: 25 U/L (ref 0–44)
AST: 29 U/L (ref 15–41)
Albumin: 3.1 g/dL — ABNORMAL LOW (ref 3.5–5.0)
Alkaline Phosphatase: 43 U/L (ref 38–126)
Anion gap: 11 (ref 5–15)
BUN: 43 mg/dL — ABNORMAL HIGH (ref 8–23)
CO2: 20 mmol/L — ABNORMAL LOW (ref 22–32)
Calcium: 8.2 mg/dL — ABNORMAL LOW (ref 8.9–10.3)
Chloride: 104 mmol/L (ref 98–111)
Creatinine, Ser: 3.32 mg/dL — ABNORMAL HIGH (ref 0.61–1.24)
GFR, Estimated: 18 mL/min — ABNORMAL LOW (ref >60)
Glucose, Bld: 128 mg/dL — ABNORMAL HIGH (ref 70–99)
Potassium: 4.6 mmol/L (ref 3.5–5.1)
Sodium: 135 mmol/L (ref 135–145)
Total Bilirubin: 0.9 mg/dL (ref 0.3–1.2)
Total Protein: 5.6 g/dL — ABNORMAL LOW (ref 6.5–8.1)

## 2022-09-14 LAB — CULTURE, BLOOD (ROUTINE X 2): Culture: NO GROWTH

## 2022-09-14 LAB — CK: Total CK: 920 U/L — ABNORMAL HIGH (ref 49–397)

## 2022-09-14 MED ORDER — SODIUM CHLORIDE 0.9 % IV SOLN: INTRAVENOUS | Status: DC

## 2022-09-14 MED ORDER — POLYETHYLENE GLYCOL 3350 17 G PO PACK
17.0000 g | PACK | Freq: Every day | ORAL | Status: DC
  Administered 2022-09-14 – 2022-09-19 (×6): 17 g via ORAL
  Filled 2022-09-14 (×6): qty 1
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
## 2022-09-14 NOTE — Progress Notes (Signed)
LTM EEG disconnected - no skin breakdown at unhook. [facility] notified.  
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
## 2022-09-14 NOTE — Progress Notes (Signed)
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 of the cervical, thoracic and lumbar spine was constructed from concomitant CT chest abdomen pelvis. RADIATION DOSE REDUCTION: This exam was performed according to the departmental dose-optimization program which includes automated exposure control, adjustment of the mA and/or kV according to patient size and/or use of iterative reconstruction technique. COMPARISON:  None Available. FINDINGS: CT CERVICAL SPINE FINDINGS Alignment: Grade 1 anterolisthesis at C4-5 Skull base and vertebrae: No acute fracture. Soft tissues and spinal canal: No prevertebral fluid or swelling. No visible canal hematoma. Disc levels: C5-7 disc space narrowing and endplate spurring. No high-grade spinal canal stenosis. Upper chest: Negative CT THORACIC SPINE FINDINGS Alignment: Normal. Vertebrae: No acute fracture or focal pathologic process. Paraspinal and other soft tissues: Negative. Disc levels: No spinal canal stenosis CT LUMBAR SPINE FINDINGS Segmentation: 5 lumbar type vertebrae. Alignment: Grade 1 anterolisthesis at L4-5  Vertebrae: No acute fracture or focal pathologic process. Paraspinal and other soft tissues: Calcific aortic atherosclerosis Disc levels: Severe bilateral L4-5 facet arthrosis with moderate bilateral L4 foraminal stenosis. No spinal canal stenosis. IMPRESSION: 1. No acute fracture or traumatic listhesis of the cervical, thoracic or lumbar spine. 2. Moderate bilateral L4 foraminal stenosis. Aortic Atherosclerosis (ICD10-I70.0). Electronically Signed   By: Deatra Robinson M.D.   On: 09/12/2022 19:20   CT C-SPINE NO CHARGE  Result Date: 09/12/2022 CLINICAL DATA:  Motor EXAM: CT CERVICAL, THORACIC, AND LUMBAR SPINE WITHOUT CONTRAST TECHNIQUE: Multidetector CT imaging of the cervical, thoracic and lumbar spine was constructed from concomitant CT chest abdomen pelvis. RADIATION DOSE REDUCTION: This exam was performed according to the departmental dose-optimization program which includes automated exposure control, adjustment of the mA and/or kV according to patient size and/or use of iterative reconstruction technique. COMPARISON:  None Available. FINDINGS: CT CERVICAL SPINE FINDINGS Alignment: Grade 1 anterolisthesis at C4-5 Skull base and vertebrae: No acute fracture. Soft tissues and spinal canal: No prevertebral fluid or swelling. No visible canal hematoma. Disc levels: C5-7 disc space narrowing and endplate spurring. No high-grade spinal canal stenosis. Upper chest: Negative CT THORACIC SPINE FINDINGS Alignment: Normal. Vertebrae: No acute fracture or focal pathologic process. Paraspinal and other soft tissues: Negative. Disc levels: No spinal canal stenosis CT LUMBAR SPINE FINDINGS Segmentation: 5 lumbar type vertebrae. Alignment: Grade 1 anterolisthesis at L4-5 Vertebrae: No acute fracture or focal pathologic process. Paraspinal and other soft tissues: Calcific aortic atherosclerosis Disc levels: Severe bilateral L4-5 facet arthrosis with moderate bilateral L4 foraminal stenosis. No spinal canal stenosis.  IMPRESSION: 1. No acute fracture or traumatic listhesis of the cervical, thoracic or lumbar spine. 2. Moderate bilateral L4 foraminal stenosis. Aortic Atherosclerosis (ICD10-I70.0). Electronically Signed   By: Deatra Robinson M.D.   On: 09/12/2022 19:20   CT HEAD CODE STROKE WO CONTRAST  Result Date: 09/12/2022 CLINICAL DATA:  Provided history: Neuro deficit, acute, stroke suspected. Altered mental  status. EXAM: CT HEAD WITHOUT CONTRAST TECHNIQUE: Contiguous axial images were obtained from the base of the skull through the vertex without intravenous contrast. RADIATION DOSE REDUCTION: This exam was performed according to the departmental dose-optimization program which includes automated exposure control, adjustment of the mA and/or kV according to patient size and/or use of iterative reconstruction technique. COMPARISON:  Brain MRI 03/04/2020. Head CT 03/04/2020. FINDINGS: Brain: Moderate cerebral atrophy. Apparent focus of asymmetric hypodensity within the right aspect of the pons (for instance as seen on series 6, image 30) (series 5, image 40). Patchy and ill-defined hypoattenuation within the cerebral white matter, nonspecific but compatible with moderate chronic small vessel ischemic disease. There is no acute intracranial hemorrhage. No demarcated cortical infarct. No extra-axial fluid collection. No evidence of an intracranial mass. No midline shift. Vascular: No hyperdense vessel. Atherosclerotic calcifications. Skull: No fracture or aggressive osseous lesion. Sinuses/Orbits: No mass or acute finding within the imaged orbits. No significant paranasal sinus disease. ASPECTS [facility] Stroke Program Early CT Score) - Ganglionic level infarction (caudate, lentiform nuclei, internal capsule, insula, M1-M3 cortex): 7 - Supraganglionic infarction (M4-M6 cortex): 3 Total score (0-10 with 10 being normal): 10 These results were communicated to Dr. Wilford Corner At 6:53 pmon 5/2/2024by text page via the [facility] messaging  system. IMPRESSION: 1. Focus of apparent asymmetric hypodensity within the right aspect of the pons. This may reflect artifact. However, if there is clinical concern for an acute right pontine infarct, a brain MRI should be considered for further evaluation. 2. No evidence of acute intracranial hemorrhage. 3. No acute demarcated cortical infarct. 4. Moderate chronic small vessel ischemic changes within the cerebral white matter. 5. Moderate cerebral atrophy. Electronically Signed   By: Jackey Loge D.O.   On: 09/12/2022 19:00     LOS: 1 day   Jeoffrey Massed, MD  [facility]    To contact the attending provider between 7A-7P or the covering provider during after hours 7P-7A, please log into the web site [email or web address] and access using universal [place] password for that web site. If you do not have the password, please call the hospital operator.  09/14/2022, 8:59 AM

## 2022-09-15 DIAGNOSIS — N179 Acute kidney failure, unspecified: Secondary | ICD-10-CM | POA: Diagnosis present

## 2022-09-15 DIAGNOSIS — I2510 Atherosclerotic heart disease of native coronary artery without angina pectoris: Secondary | ICD-10-CM | POA: Diagnosis not present

## 2022-09-15 LAB — COMPREHENSIVE METABOLIC PANEL
ALT: 24 U/L (ref 0–44)
AST: 25 U/L (ref 15–41)
Albumin: 3 g/dL — ABNORMAL LOW (ref 3.5–5.0)
Alkaline Phosphatase: 44 U/L (ref 38–126)
Anion gap: 12 (ref 5–15)
BUN: 35 mg/dL — ABNORMAL HIGH (ref 8–23)
CO2: 19 mmol/L — ABNORMAL LOW (ref 22–32)
Calcium: 8.4 mg/dL — ABNORMAL LOW (ref 8.9–10.3)
Chloride: 104 mmol/L (ref 98–111)
Creatinine, Ser: 2.51 mg/dL — ABNORMAL HIGH (ref 0.61–1.24)
GFR, Estimated: 26 mL/min — ABNORMAL LOW (ref >60)
Glucose, Bld: 133 mg/dL — ABNORMAL HIGH (ref 70–99)
Potassium: 4.2 mmol/L (ref 3.5–5.1)
Sodium: 135 mmol/L (ref 135–145)
Total Bilirubin: 0.8 mg/dL (ref 0.3–1.2)
Total Protein: 5.8 g/dL — ABNORMAL LOW (ref 6.5–8.1)

## 2022-09-15 LAB — CBC
HCT: 24.9 % — ABNORMAL LOW (ref 39.0–52.0)
HCT: 25.4 % — ABNORMAL LOW (ref 39.0–52.0)
Hemoglobin: 8.4 g/dL — ABNORMAL LOW (ref 13.0–17.0)
Hemoglobin: 8.7 g/dL — ABNORMAL LOW (ref 13.0–17.0)
MCH: 34.7 pg — ABNORMAL HIGH (ref 26.0–34.0)
MCH: 34.7 pg — ABNORMAL HIGH (ref 26.0–34.0)
MCHC: 33.7 g/dL (ref 30.0–36.0)
MCHC: 34.3 g/dL (ref 30.0–36.0)
MCV: 102.9 fL — ABNORMAL HIGH (ref 80.0–100.0)
MCV: 101.2 fL — ABNORMAL HIGH (ref 80.0–100.0)
Platelets: 139 10*3/uL — ABNORMAL LOW (ref 150–400)
Platelets: 153 10*3/uL (ref 150–400)
RBC: 2.42 MIL/uL — ABNORMAL LOW (ref 4.22–5.81)
RBC: 2.51 MIL/uL — ABNORMAL LOW (ref 4.22–5.81)
RDW: 13.9 % (ref 11.5–15.5)
RDW: 13.7 % (ref 11.5–15.5)
WBC: 10.1 10*3/uL (ref 4.0–10.5)
WBC: 11.3 10*3/uL — ABNORMAL HIGH (ref 4.0–10.5)
nRBC: 0 % (ref 0.0–0.2)
nRBC: 0 % (ref 0.0–0.2)

## 2022-09-15 LAB — BRAIN NATRIURETIC PEPTIDE: B Natriuretic Peptide: 313.2 pg/mL — ABNORMAL HIGH (ref 0.0–100.0)

## 2022-09-15 LAB — CK: Total CK: 698 U/L — ABNORMAL HIGH (ref 49–397)

## 2022-09-15 LAB — MAGNESIUM: Magnesium: 1.8 mg/dL (ref 1.7–2.4)

## 2022-09-15 LAB — CULTURE, BLOOD (ROUTINE X 2)

## 2022-09-15 MED ORDER — SODIUM CHLORIDE 0.9 % IV SOLN: INTRAVENOUS | Status: DC

## 2022-09-15 MED ORDER — HYDRALAZINE HCL 20 MG/ML IJ SOLN: 10.0000 mg | Freq: Four times a day (QID) | INTRAMUSCULAR | Status: DC | PRN

## 2022-09-15 MED ORDER — HALOPERIDOL LACTATE 5 MG/ML IJ SOLN: 5.0000 mg | Freq: Four times a day (QID) | INTRAMUSCULAR | Status: DC | PRN

## 2022-09-15 MED ORDER — QUETIAPINE FUMARATE 25 MG PO TABS
25.0000 mg | ORAL_TABLET | Freq: Every day | ORAL | Status: DC
  Administered 2022-09-15: 25 mg via ORAL
  Filled 2022-09-15: qty 1

## 2022-09-15 MED ORDER — OXYCODONE HCL 5 MG PO TABS
5.0000 mg | ORAL_TABLET | Freq: Four times a day (QID) | ORAL | Status: DC | PRN
  Administered 2022-09-15 – 2022-09-18 (×9): 5 mg via ORAL
  Filled 2022-09-15 (×9): qty 1

## 2022-09-15 MED ORDER — CARVEDILOL 3.125 MG PO TABS
3.1250 mg | ORAL_TABLET | Freq: Two times a day (BID) | ORAL | Status: DC
  Administered 2022-09-15 – 2022-09-23 (×15): 3.125 mg via ORAL
  Filled 2022-09-15 (×15): qty 1
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
## 2022-09-15 NOTE — Plan of Care (Signed)
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
## 2022-09-16 DIAGNOSIS — I2510 Atherosclerotic heart disease of native coronary artery without angina pectoris: Secondary | ICD-10-CM | POA: Diagnosis not present

## 2022-09-16 DIAGNOSIS — N179 Acute kidney failure, unspecified: Secondary | ICD-10-CM | POA: Diagnosis not present

## 2022-09-16 LAB — CBC
HCT: 22.2 % — ABNORMAL LOW (ref 39.0–52.0)
Hemoglobin: 7.8 g/dL — ABNORMAL LOW (ref 13.0–17.0)
MCH: 34.7 pg — ABNORMAL HIGH (ref 26.0–34.0)
MCHC: 35.1 g/dL (ref 30.0–36.0)
MCV: 98.7 fL (ref 80.0–100.0)
Platelets: 141 10*3/uL — ABNORMAL LOW (ref 150–400)
RBC: 2.25 MIL/uL — ABNORMAL LOW (ref 4.22–5.81)
RDW: 13.6 % (ref 11.5–15.5)
WBC: 7.8 10*3/uL (ref 4.0–10.5)
nRBC: 0 % (ref 0.0–0.2)

## 2022-09-16 LAB — COMPREHENSIVE METABOLIC PANEL
ALT: 23 U/L (ref 0–44)
AST: 19 U/L (ref 15–41)
Albumin: 2.7 g/dL — ABNORMAL LOW (ref 3.5–5.0)
Alkaline Phosphatase: 41 U/L (ref 38–126)
Anion gap: 8 (ref 5–15)
BUN: 29 mg/dL — ABNORMAL HIGH (ref 8–23)
CO2: 19 mmol/L — ABNORMAL LOW (ref 22–32)
Calcium: 8.2 mg/dL — ABNORMAL LOW (ref 8.9–10.3)
Chloride: 108 mmol/L (ref 98–111)
Creatinine, Ser: 2.06 mg/dL — ABNORMAL HIGH (ref 0.61–1.24)
GFR, Estimated: 33 mL/min — ABNORMAL LOW (ref >60)
Glucose, Bld: 129 mg/dL — ABNORMAL HIGH (ref 70–99)
Potassium: 4 mmol/L (ref 3.5–5.1)
Sodium: 135 mmol/L (ref 135–145)
Total Bilirubin: 1 mg/dL (ref 0.3–1.2)
Total Protein: 5.3 g/dL — ABNORMAL LOW (ref 6.5–8.1)

## 2022-09-16 LAB — CULTURE, BLOOD (ROUTINE X 2)

## 2022-09-16 LAB — BRAIN NATRIURETIC PEPTIDE: B Natriuretic Peptide: 363.4 pg/mL — ABNORMAL HIGH (ref 0.0–100.0)

## 2022-09-16 LAB — MAGNESIUM: Magnesium: 1.7 mg/dL (ref 1.7–2.4)

## 2022-09-16 MED ORDER — QUETIAPINE FUMARATE 100 MG PO TABS
100.0000 mg | ORAL_TABLET | Freq: Every day | ORAL | Status: DC
  Administered 2022-09-16 – 2022-09-18 (×3): 100 mg via ORAL
  Filled 2022-09-16 (×3): qty 1

## 2022-09-16 MED ORDER — CHLORHEXIDINE GLUCONATE CLOTH 2 % EX PADS
6.0000 | MEDICATED_PAD | Freq: Every day | CUTANEOUS | Status: DC
  Administered 2022-09-16 – 2022-09-19 (×4): 6 via TOPICAL

## 2022-09-16 NOTE — NC FL2 (Signed)
[place] MEDICAID FL2 LEVEL OF CARE FORM     IDENTIFICATION  Patient Name: Jesse [name] Birthdate: 09/14/1945 Sex: male Admission Date (Current Location): 09/12/2022  [facility] and [place] Number:  Chiropodist and Address:  [facility] [place]. [facility], [address], [place], [place] [address]      Provider Number: [id number]  Attending Physician Name and Address:  Leroy Sea, MD  Relative Name and Phone Number:       Current Level of Care: Hospital Recommended Level of Care: Skilled Nursing Facility Prior Approval Number:    Date Approved/Denied:   PASRR Number: [id number]  Discharge Plan: SNF    Current Diagnoses: Patient Active Problem List   Diagnosis Date Noted   AKI (acute kidney injury) (HCC) 09/15/2022   Altered mental state 09/13/2022   Pelvic ring fracture (HCC) 09/13/2022   Altered mental status 09/13/2022   Congenital hearing loss of both ears 08/17/2021   Medicare annual wellness visit, subsequent 08/14/2021   Health maintenance examination 08/14/2021   Advanced directives, counseling/discussion 08/14/2021   History of melanoma 08/14/2021   S/P minimally-invasive mitral valve repair 07/18/2020   CKD (chronic kidney disease) stage 3, GFR 30-59 ml/min (HCC) 03/20/2020   Cerebral amyloid angiopathy (HCC) 03/15/2020   Right sided sciatica 06/24/2018   Imbalance 09/10/2017   Vitamin D deficiency 04/01/2016   CAD (coronary artery disease) 05/17/2011   Hyperlipidemia 04/30/2011   OBSTRUCTIVE SLEEP APNEA 10/24/2009   RHINITIS, CHRONIC 10/24/2009   Prediabetes 02/18/2007   Irritability 09/19/2006   Essential hypertension 09/19/2006    Orientation RESPIRATION BLADDER Height & Weight     Self, Situation, Place  Normal Incontinent, Indwelling catheter Weight: 191 lb 12.8 oz (87 kg) Height:  5\' 9"  (175.3 cm)  BEHAVIORAL SYMPTOMS/MOOD NEUROLOGICAL BOWEL NUTRITION STATUS      Continent Diet (See dc summary)  AMBULATORY STATUS  COMMUNICATION OF NEEDS Skin   Extensive Assist Verbally                         Personal Care Assistance Level of Assistance  Bathing, Feeding, Dressing Bathing Assistance: Maximum assistance Feeding assistance: Limited assistance Dressing Assistance: Limited assistance     Functional Limitations Info             SPECIAL CARE FACTORS FREQUENCY  PT (By licensed PT), OT (By licensed OT)     PT Frequency: 5x/week OT Frequency: 5x/week            Contractures Contractures Info: Not present    Additional Factors Info  Code Status, Allergies, Psychotropic Code Status Info: Full Allergies Info: Hyzaar (Losartan Potassium-hctz), Paroxetine, Sertraline Hcl Psychotropic Info: Seroquel         Current Medications (09/16/2022):  This is the current hospital active medication list Current Facility-Administered Medications  Medication Dose Route Frequency Provider Last Rate Last Admin   acetaminophen (TYLENOL) tablet 650 mg  650 mg Oral Q6H PRN Crosley, Debby, MD   650 mg at 09/16/22 0511   Or   acetaminophen (TYLENOL) suppository 650 mg  650 mg Rectal Q6H PRN Crosley, Debby, MD       carvedilol (COREG) tablet 3.125 mg  3.125 mg Oral BID WC Leroy Sea, MD   3.125 mg at 09/16/22 0945   Chlorhexidine Gluconate Cloth 2 % PADS 6 each  6 each Topical Daily Leroy Sea, MD   6 each at 09/16/22 1348   haloperidol lactate (HALDOL) injection 5 mg  5 mg Intramuscular Q6H PRN Leroy Sea, MD       hydrALAZINE (APRESOLINE) injection 10 mg  10 mg Intravenous Q6H PRN Leroy Sea, MD       ondansetron (ZOFRAN) tablet 4 mg  4 mg Oral Q6H PRN Crosley, Debby, MD       Or   ondansetron (ZOFRAN) injection 4 mg  4 mg Intravenous Q6H PRN Crosley, Debby, MD   4 mg at 09/13/22 0419   oxyCODONE (Oxy IR/ROXICODONE) immediate release tablet 5 mg  5 mg Oral Q6H PRN Leroy Sea, MD   5 mg at 09/16/22 1216   polyethylene glycol (MIRALAX / GLYCOLAX) packet 17 g  17 g Oral  Daily Maretta Bees, MD   17 g at 09/16/22 0945   QUEtiapine (SEROQUEL) tablet 100 mg  100 mg Oral Q0600 Leroy Sea, MD       senna-docusate (Senokot-S) tablet 1 tablet  1 tablet Oral QHS PRN Gery Pray, MD   1 tablet at 09/15/22 2310     Discharge Medications: Please see discharge summary for a list of discharge medications.  Relevant Imaging Results:  Relevant Lab Results:   Additional Information SSN: [id number] [place], [place]
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
## 2022-09-16 NOTE — Progress Notes (Signed)
  Transition of Care [facility]) Screening Note   Patient Details  Name: Jesse [name] Date of Birth: 01/04/1946   Transition of Care [facility]) CM/SW Contact:    Mearl Latin, LCSW Phone Number: 09/16/2022, 8:44 AM    Transition of Care Department [facility]) has reviewed patient currently in restraints with altered mentation at this time. We will continue to monitor patient advancement through interdisciplinary progression rounds and follow for SNF workup. If new patient transition needs arise, please place a TOC consult.

## 2022-09-16 NOTE — Plan of Care (Signed)

## 2022-09-16 NOTE — Care Management Important Message (Signed)
Important Message  Patient Details  Name: Jesse [name] MRN: [id number] Date of Birth: Jan 22, 1946   Medicare Important Message Given:  Yes     Kalissa Grays Stefan Church 09/16/2022, 3:59 PM

## 2022-09-17 DIAGNOSIS — I2510 Atherosclerotic heart disease of native coronary artery without angina pectoris: Secondary | ICD-10-CM | POA: Diagnosis not present

## 2022-09-17 DIAGNOSIS — N179 Acute kidney failure, unspecified: Secondary | ICD-10-CM | POA: Diagnosis not present

## 2022-09-17 LAB — BRAIN NATRIURETIC PEPTIDE: B Natriuretic Peptide: 216.7 pg/mL — ABNORMAL HIGH (ref 0.0–100.0)

## 2022-09-17 LAB — COMPREHENSIVE METABOLIC PANEL
ALT: 25 U/L (ref 0–44)
AST: 25 U/L (ref 15–41)
Albumin: 2.8 g/dL — ABNORMAL LOW (ref 3.5–5.0)
Alkaline Phosphatase: 50 U/L (ref 38–126)
Anion gap: 8 (ref 5–15)
BUN: 24 mg/dL — ABNORMAL HIGH (ref 8–23)
CO2: 22 mmol/L (ref 22–32)
Calcium: 8.6 mg/dL — ABNORMAL LOW (ref 8.9–10.3)
Chloride: 104 mmol/L (ref 98–111)
Creatinine, Ser: 1.89 mg/dL — ABNORMAL HIGH (ref 0.61–1.24)
GFR, Estimated: 36 mL/min — ABNORMAL LOW (ref >60)
Glucose, Bld: 111 mg/dL — ABNORMAL HIGH (ref 70–99)
Potassium: 4.1 mmol/L (ref 3.5–5.1)
Sodium: 134 mmol/L — ABNORMAL LOW (ref 135–145)
Total Bilirubin: 1.5 mg/dL — ABNORMAL HIGH (ref 0.3–1.2)
Total Protein: 5.8 g/dL — ABNORMAL LOW (ref 6.5–8.1)

## 2022-09-17 LAB — MAGNESIUM: Magnesium: 1.8 mg/dL (ref 1.7–2.4)

## 2022-09-17 LAB — CULTURE, BLOOD (ROUTINE X 2): Special Requests: ADEQUATE

## 2022-09-17 MED ORDER — TAMSULOSIN HCL 0.4 MG PO CAPS
0.4000 mg | ORAL_CAPSULE | Freq: Every day | ORAL | Status: DC
  Administered 2022-09-17 – 2022-09-23 (×6): 0.4 mg via ORAL
  Filled 2022-09-17 (×6): qty 1

## 2022-09-17 MED ORDER — ORAL CARE MOUTH RINSE: 15.0000 mL | OROMUCOSAL | Status: DC | PRN
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
## 2022-09-17 NOTE — TOC Progression Note (Addendum)
Transition of Care [facility]) - Progression Note    Patient Details  Name: Jesse [name] MRN: [id number] Date of Birth: 04/01/1946  Transition of Care [facility]) CM/SW Contact  Mearl Latin, LCSW Phone Number: 09/17/2022, 12:08 PM  Clinical Narrative:    12pm-CSW left voicemail for spouse regarding SNF offers. Will also check the room.   12:45pm-CSW spoke with patient' spouse and presented bed offers. She has selected [facility]. CSW explained SNF policies and wife stated understanding. [facility] has a private room for patient tomorrow if stable. CSW left voicemail for insurance to request authorization process.   CSW received request from patient's spouse for patient to have bed rails at SNF. CSW made her aware that per [facility], patient would have to be evaluated at SNF in order to request permission.   2:40pm-CSW contacted [employer] and provided details for authorization request for SNF and PTAR.   Expected Discharge Plan: Skilled Nursing Facility Barriers to Discharge: Continued Medical Work up, SNF Pending bed offer, English as a second language teacher  Expected Discharge Plan and Services In-house Referral: Clinical Social Work   Post Acute Care Choice: Skilled Nursing Facility Living arrangements for the past 2 months: Single Family Home                                       Social Determinants of Health (SDOH) Interventions SDOH Screenings   Food Insecurity: No Food Insecurity (09/13/2022)  Housing: Low Risk  (09/13/2022)  Transportation Needs: No Transportation Needs (09/13/2022)  Utilities: Not At Risk (09/13/2022)  Depression (PHQ2-9): Low Risk  (08/14/2021)  Tobacco Use: Medium Risk (09/13/2022)    Readmission Risk Interventions     No data to display

## 2022-09-18 DIAGNOSIS — I2510 Atherosclerotic heart disease of native coronary artery without angina pectoris: Secondary | ICD-10-CM | POA: Diagnosis not present

## 2022-09-18 DIAGNOSIS — N179 Acute kidney failure, unspecified: Secondary | ICD-10-CM | POA: Diagnosis not present

## 2022-09-18 LAB — CULTURE, BLOOD (ROUTINE X 2): Culture: NO GROWTH

## 2022-09-18 MED ORDER — BISACODYL 10 MG RE SUPP
10.0000 mg | Freq: Once | RECTAL | Status: AC
  Administered 2022-09-18: 10 mg via RECTAL
  Filled 2022-09-18: qty 1

## 2022-09-18 MED ORDER — DOCUSATE SODIUM 100 MG PO CAPS
200.0000 mg | ORAL_CAPSULE | Freq: Two times a day (BID) | ORAL | Status: DC
  Administered 2022-09-18 – 2022-09-19 (×3): 200 mg via ORAL
  Filled 2022-09-18 (×4): qty 2

## 2022-09-18 MED ORDER — MAGNESIUM HYDROXIDE 400 MG/5ML PO SUSP
30.0000 mL | Freq: Two times a day (BID) | ORAL | Status: AC
  Administered 2022-09-18 (×2): 30 mL via ORAL
  Filled 2022-09-18 (×2): qty 30

## 2022-09-18 MED ORDER — LACTULOSE 10 GM/15ML PO SOLN
30.0000 g | Freq: Two times a day (BID) | ORAL | Status: AC
  Administered 2022-09-18 (×2): 30 g via ORAL
  Filled 2022-09-18 (×2): qty 60
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
## 2022-09-18 NOTE — Progress Notes (Signed)
OT Cancellation Note  Patient Details Name: Jesse [name] MRN: [id number] DOB: 26-Aug-1945   Cancelled Treatment:    Reason Eval/Treat Not Completed: Pain limiting ability to participate  Pt refused participation in skilled OT session secondary to pain. RN aware and prepping to administer medication at that time. OT will attempt to see pt later today as time allows.   Lendon Colonel 09/18/2022, 12:45 PM

## 2022-09-18 NOTE — TOC Progression Note (Signed)
Transition of Care [facility]) - Progression Note    Patient Details  Name: JANLUCAS MUENCHOW MRN: [id number] Date of Birth: 1945-06-03  Transition of Care [facility]) CM/SW Contact  Mearl Latin, LCSW Phone Number: 09/18/2022, 11:43 AM  Clinical Narrative:    Insurance approval received for [facility], [id number], approved for 7 days (must admit within 5 business days). PTAR transport approval # [id number].  CSW updated patient's spouse.    Expected Discharge Plan: Skilled Nursing Facility Barriers to Discharge: Continued Medical Work up  Expected Discharge Plan and Services In-house Referral: Clinical Social Work   Post Acute Care Choice: Skilled Nursing Facility Living arrangements for the past 2 months: Single Family Home                                       Social Determinants of Health (SDOH) Interventions SDOH Screenings   Food Insecurity: No Food Insecurity (09/13/2022)  Housing: Low Risk  (09/13/2022)  Transportation Needs: No Transportation Needs (09/13/2022)  Utilities: Not At Risk (09/13/2022)  Depression (PHQ2-9): Low Risk  (08/14/2021)  Tobacco Use: Medium Risk (09/13/2022)    Readmission Risk Interventions     No data to display
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
## 2022-09-18 NOTE — Progress Notes (Signed)
Patient refusing to wear telemetry leads at this time. MD notified. Patient and wife educated at bedside in regards to importance of monitoring.

## 2022-09-19 ENCOUNTER — Inpatient Hospital Stay (HOSPITAL_COMMUNITY): Payer: PPO

## 2022-09-19 DIAGNOSIS — I2510 Atherosclerotic heart disease of native coronary artery without angina pectoris: Secondary | ICD-10-CM | POA: Diagnosis not present

## 2022-09-19 DIAGNOSIS — N179 Acute kidney failure, unspecified: Secondary | ICD-10-CM | POA: Diagnosis not present

## 2022-09-19 DIAGNOSIS — K56609 Unspecified intestinal obstruction, unspecified as to partial versus complete obstruction: Secondary | ICD-10-CM

## 2022-09-19 LAB — CBC
HCT: 25.8 % — ABNORMAL LOW (ref 39.0–52.0)
Hemoglobin: 8.9 g/dL — ABNORMAL LOW (ref 13.0–17.0)
MCH: 34.5 pg — ABNORMAL HIGH (ref 26.0–34.0)
MCHC: 34.5 g/dL (ref 30.0–36.0)
MCV: 100 fL (ref 80.0–100.0)
Platelets: 217 10*3/uL (ref 150–400)
RBC: 2.58 MIL/uL — ABNORMAL LOW (ref 4.22–5.81)
RDW: 13.7 % (ref 11.5–15.5)
WBC: 11.2 10*3/uL — ABNORMAL HIGH (ref 4.0–10.5)
nRBC: 0 % (ref 0.0–0.2)

## 2022-09-19 LAB — C-REACTIVE PROTEIN: CRP: 19.9 mg/dL — ABNORMAL HIGH (ref <1.0)

## 2022-09-19 LAB — BASIC METABOLIC PANEL
Anion gap: 12 (ref 5–15)
BUN: 32 mg/dL — ABNORMAL HIGH (ref 8–23)
CO2: 22 mmol/L (ref 22–32)
Calcium: 8.7 mg/dL — ABNORMAL LOW (ref 8.9–10.3)
Chloride: 100 mmol/L (ref 98–111)
Creatinine, Ser: 1.94 mg/dL — ABNORMAL HIGH (ref 0.61–1.24)
GFR, Estimated: 35 mL/min — ABNORMAL LOW (ref >60)
Glucose, Bld: 128 mg/dL — ABNORMAL HIGH (ref 70–99)
Potassium: 3.9 mmol/L (ref 3.5–5.1)
Sodium: 134 mmol/L — ABNORMAL LOW (ref 135–145)

## 2022-09-19 LAB — MAGNESIUM: Magnesium: 2 mg/dL (ref 1.7–2.4)

## 2022-09-19 LAB — BRAIN NATRIURETIC PEPTIDE: B Natriuretic Peptide: 120.5 pg/mL — ABNORMAL HIGH (ref 0.0–100.0)

## 2022-09-19 LAB — PROCALCITONIN: Procalcitonin: 0.21 ng/mL

## 2022-09-19 MED ORDER — QUETIAPINE FUMARATE 50 MG PO TABS: 50.0000 mg | ORAL_TABLET | Freq: Every day | ORAL | Status: DC

## 2022-09-19 MED ORDER — SODIUM CHLORIDE 0.9 % IV SOLN
3.0000 g | Freq: Three times a day (TID) | INTRAVENOUS | Status: DC
  Administered 2022-09-19 – 2022-09-23 (×12): 3 g via INTRAVENOUS
  Filled 2022-09-19 (×12): qty 8

## 2022-09-19 MED ORDER — TRAMADOL HCL 50 MG PO TABS: 50.0000 mg | ORAL_TABLET | Freq: Three times a day (TID) | ORAL | Status: DC | PRN

## 2022-09-19 MED ORDER — LACTATED RINGERS IV SOLN: INTRAVENOUS | Status: AC

## 2022-09-19 MED ORDER — QUETIAPINE FUMARATE 50 MG PO TABS
50.0000 mg | ORAL_TABLET | Freq: Every day | ORAL | Status: DC
  Administered 2022-09-19: 50 mg via ORAL
  Filled 2022-09-19: qty 1

## 2022-09-19 MED ORDER — LACTULOSE 10 GM/15ML PO SOLN
30.0000 g | Freq: Once | ORAL | Status: AC
  Administered 2022-09-19: 30 g via ORAL
  Filled 2022-09-19: qty 60
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
## 2022-09-19 NOTE — Progress Notes (Signed)
Pt has been drowsy and sleeping most of this shift, VSS, BP slightly soft, pt does respond to voice and opens eyes, spoke a few words, seroquel given at 1600, pt does not appear to be in any distress, call bell in reach, family at bedside, bed alarm activated and audible, will monitor closely
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
## 2022-09-19 NOTE — TOC Progression Note (Signed)
Transition of Care [facility]) - Progression Note    Patient Details  Name: JAHLIL BLANKE MRN: [id number] Date of Birth: [birth year]  [facility]) CM/SW Contact  Mearl Latin, LCSW Phone Number: 09/19/2022, 10:23 AM  Clinical Narrative:    CSW made [facility] aware that patient is not yet stable for discharge.    Expected Discharge Plan: Skilled Nursing Facility Barriers to Discharge: Continued Medical Work up  Expected Discharge Plan and Services In-house Referral: Clinical Social Work   Post Acute Care Choice: Skilled Nursing Facility Living arrangements for the past 2 months: Single Family Home                                       Social Determinants of Health (SDOH) Interventions SDOH Screenings   Food Insecurity: No Food Insecurity (09/13/2022)  Housing: Low Risk  (09/13/2022)  Transportation Needs: No Transportation Needs (09/13/2022)  Utilities: Not At Risk (09/13/2022)  Depression (PHQ2-9): Low Risk  (08/14/2021)  Tobacco Use: Medium Risk (09/13/2022)    Readmission Risk Interventions     No data to display

## 2022-09-19 NOTE — Progress Notes (Signed)
Pharmacy Antibiotic Note  Jesse [name] is a 77 y.o. male admitted on 09/12/2022 with aspiration pneumonia.  Pharmacy has been consulted for Unasyn dosing.  Plan: Unasyn 3gm Iv q8h Monitor for clinical course, renal function and deescalation.   Height: 5\' 9"  (175.3 cm) Weight: 87 kg (191 lb 12.8 oz) IBW/kg (Calculated) : 70.7  Temp (24hrs), Avg:99.1 F (37.3 C), Min:97.8 F (36.6 C), Max:100.2 F (37.9 C)  Recent Labs  Lab 09/13/22 0540 09/13/22 1023 09/14/22 0242 09/14/22 1806 09/15/22 0553 09/15/22 1737 09/16/22 0551 09/17/22 0414 09/19/22 0619  WBC  --   --  10.3 9.0 10.1 11.3* 7.8  --  11.2*  CREATININE  --   --  3.32*  --  2.51*  --  2.06* 1.89* 1.94*  LATICACIDVEN 2.1* 2.2*  --   --   --   --   --   --   --     Estimated Creatinine Clearance: 34.8 mL/min (A) (by C-G formula based on SCr of 1.94 mg/dL (H)).    Allergies  Allergen Reactions   Hyzaar [Losartan Potassium-Hctz] Other (See Comments)    Renal failure    Paroxetine Diarrhea   Sertraline Hcl Diarrhea    Antimicrobials this admission: 5/9 Unasyn >>  Dose adjustments this admission: N/a  Microbiology results: 5/3 BCx: NGF   Wynonna Fitzhenry A. Jeanella Craze, PharmD, BCPS, FNKF Clinical Pharmacist [place] Please utilize Amion for appropriate phone number to reach the unit pharmacist [facility])  09/19/2022 12:59 PM
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
## 2022-09-20 ENCOUNTER — Inpatient Hospital Stay (HOSPITAL_COMMUNITY): Payer: PPO

## 2022-09-20 DIAGNOSIS — I2510 Atherosclerotic heart disease of native coronary artery without angina pectoris: Secondary | ICD-10-CM | POA: Diagnosis not present

## 2022-09-20 DIAGNOSIS — N179 Acute kidney failure, unspecified: Secondary | ICD-10-CM | POA: Diagnosis not present

## 2022-09-20 LAB — CBC
HCT: 28 % — ABNORMAL LOW (ref 39.0–52.0)
Hemoglobin: 9.5 g/dL — ABNORMAL LOW (ref 13.0–17.0)
MCH: 33.7 pg (ref 26.0–34.0)
MCHC: 33.9 g/dL (ref 30.0–36.0)
MCV: 99.3 fL (ref 80.0–100.0)
Platelets: 305 10*3/uL (ref 150–400)
RBC: 2.82 MIL/uL — ABNORMAL LOW (ref 4.22–5.81)
RDW: 13.5 % (ref 11.5–15.5)
WBC: 18 10*3/uL — ABNORMAL HIGH (ref 4.0–10.5)
nRBC: 0 % (ref 0.0–0.2)

## 2022-09-20 LAB — COMPREHENSIVE METABOLIC PANEL
ALT: 27 U/L (ref 0–44)
AST: 23 U/L (ref 15–41)
Albumin: 2.7 g/dL — ABNORMAL LOW (ref 3.5–5.0)
Alkaline Phosphatase: 69 U/L (ref 38–126)
Anion gap: 16 — ABNORMAL HIGH (ref 5–15)
BUN: 50 mg/dL — ABNORMAL HIGH (ref 8–23)
CO2: 23 mmol/L (ref 22–32)
Calcium: 9.2 mg/dL (ref 8.9–10.3)
Chloride: 97 mmol/L — ABNORMAL LOW (ref 98–111)
Creatinine, Ser: 2.19 mg/dL — ABNORMAL HIGH (ref 0.61–1.24)
GFR, Estimated: 30 mL/min — ABNORMAL LOW (ref >60)
Glucose, Bld: 162 mg/dL — ABNORMAL HIGH (ref 70–99)
Potassium: 4.2 mmol/L (ref 3.5–5.1)
Sodium: 136 mmol/L (ref 135–145)
Total Bilirubin: 0.9 mg/dL (ref 0.3–1.2)
Total Protein: 6.8 g/dL (ref 6.5–8.1)

## 2022-09-20 LAB — MAGNESIUM: Magnesium: 2 mg/dL (ref 1.7–2.4)

## 2022-09-20 LAB — PROCALCITONIN: Procalcitonin: 0.41 ng/mL

## 2022-09-20 LAB — AMMONIA: Ammonia: 34 umol/L (ref 9–35)

## 2022-09-20 LAB — C-REACTIVE PROTEIN: CRP: 20.8 mg/dL — ABNORMAL HIGH (ref <1.0)

## 2022-09-20 LAB — BRAIN NATRIURETIC PEPTIDE: B Natriuretic Peptide: 192.5 pg/mL — ABNORMAL HIGH (ref 0.0–100.0)

## 2022-09-20 MED ORDER — HALOPERIDOL LACTATE 5 MG/ML IJ SOLN
2.0000 mg | Freq: Four times a day (QID) | INTRAMUSCULAR | Status: DC | PRN
  Administered 2022-09-20: 2 mg via INTRAMUSCULAR
  Filled 2022-09-20: qty 1

## 2022-09-20 MED ORDER — DEXTROSE IN LACTATED RINGERS 5 % IV SOLN: INTRAVENOUS | Status: DC

## 2022-09-20 MED ORDER — BISACODYL 10 MG RE SUPP
10.0000 mg | Freq: Every day | RECTAL | Status: DC
  Administered 2022-09-20 – 2022-09-21 (×2): 10 mg via RECTAL
  Filled 2022-09-20 (×2): qty 1

## 2022-09-20 MED ORDER — QUETIAPINE FUMARATE 25 MG PO TABS: 25.0000 mg | ORAL_TABLET | Freq: Every day | ORAL | Status: DC

## 2022-09-20 MED ORDER — GUAIFENESIN-DM 100-10 MG/5ML PO SYRP
10.0000 mL | ORAL_SOLUTION | ORAL | Status: DC | PRN
  Administered 2022-09-20: 10 mL via ORAL
  Filled 2022-09-20: qty 10
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
## 2022-09-20 NOTE — Progress Notes (Signed)
SLP Cancellation Note  Patient Details Name: Jesse [name] MRN: [id number] DOB: Jul 20, 1945   Cancelled treatment:       Reason Eval/Treat Not Completed: Medical issues which prohibited therapy (Pt has been made NPO for possible pSBO/ileus. MBS will therefore be deferred until next week if he's able to have p.o. intake at that time and it still appears clinically indicated.)  Silverio Hagan I. Vear Clock, MS, CCC-SLP Neuro Diagnostic Specialist  Acute Rehabilitation Services Office number: [phone]  Scheryl Marten 09/20/2022, 10:29 AM

## 2022-09-20 NOTE — TOC Progression Note (Signed)
Transition of Care [facility]) - Progression Note    Patient Details  Name: Jesse [name] MRN: [id number] Date of Birth: 1945-07-08  Transition of [facility]) CM/SW Contact  Mearl Latin, LCSW Phone Number: 09/20/2022, 5:07 PM  Clinical Narrative:    CSW made [facility] aware that patient is not yet stable for discharge. Weekend to contact [facility] there if patient requires weekend discharge. Insurance approval effective until 5/14.    Expected Discharge Plan: Skilled Nursing Facility Barriers to Discharge: Continued Medical Work up  Expected Discharge Plan and Services In-house Referral: Clinical Social Work   Post Acute Care Choice: Skilled Nursing Facility Living arrangements for the past 2 months: Single Family Home                                       Social Determinants of Health (SDOH) Interventions SDOH Screenings   Food Insecurity: No Food Insecurity (09/13/2022)  Housing: Low Risk  (09/13/2022)  Transportation Needs: No Transportation Needs (09/13/2022)  Utilities: Not At Risk (09/13/2022)  Depression (PHQ2-9): Low Risk  (08/14/2021)  Tobacco Use: Medium Risk (09/13/2022)    Readmission Risk Interventions     No data to display

## 2022-09-20 NOTE — Progress Notes (Signed)
Around 1815, pt became agitated with multiple attempts to get out of bed. Nurse was not able to redirect pt. Pt stated he had to have bowel movement but was unable to hold it for the bed pan and insisted on using bedside commode. Pt defecated on floor and bed large amount. Haldol was administered to keep pt from getting out of bed unsafely.
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
## 2022-09-21 ENCOUNTER — Inpatient Hospital Stay (HOSPITAL_COMMUNITY): Payer: PPO

## 2022-09-21 DIAGNOSIS — I2510 Atherosclerotic heart disease of native coronary artery without angina pectoris: Secondary | ICD-10-CM | POA: Diagnosis not present

## 2022-09-21 DIAGNOSIS — N179 Acute kidney failure, unspecified: Secondary | ICD-10-CM | POA: Diagnosis not present

## 2022-09-21 LAB — COMPREHENSIVE METABOLIC PANEL
ALT: 34 U/L (ref 0–44)
AST: 40 U/L (ref 15–41)
Albumin: 2.4 g/dL — ABNORMAL LOW (ref 3.5–5.0)
Alkaline Phosphatase: 81 U/L (ref 38–126)
Anion gap: 12 (ref 5–15)
BUN: 54 mg/dL — ABNORMAL HIGH (ref 8–23)
CO2: 25 mmol/L (ref 22–32)
Calcium: 8.8 mg/dL — ABNORMAL LOW (ref 8.9–10.3)
Chloride: 101 mmol/L (ref 98–111)
Creatinine, Ser: 2.01 mg/dL — ABNORMAL HIGH (ref 0.61–1.24)
GFR, Estimated: 34 mL/min — ABNORMAL LOW (ref >60)
Glucose, Bld: 152 mg/dL — ABNORMAL HIGH (ref 70–99)
Potassium: 3.6 mmol/L (ref 3.5–5.1)
Sodium: 138 mmol/L (ref 135–145)
Total Bilirubin: 0.9 mg/dL (ref 0.3–1.2)
Total Protein: 6.2 g/dL — ABNORMAL LOW (ref 6.5–8.1)

## 2022-09-21 LAB — CBC WITH DIFFERENTIAL/PLATELET
Abs Immature Granulocytes: 0.05 10*3/uL (ref 0.00–0.07)
Basophils Absolute: 0 10*3/uL (ref 0.0–0.1)
Basophils Relative: 0 %
Eosinophils Absolute: 0 10*3/uL (ref 0.0–0.5)
Eosinophils Relative: 0 %
HCT: 24.9 % — ABNORMAL LOW (ref 39.0–52.0)
Hemoglobin: 8.5 g/dL — ABNORMAL LOW (ref 13.0–17.0)
Immature Granulocytes: 0 %
Lymphocytes Relative: 9 %
Lymphs Abs: 1.1 10*3/uL (ref 0.7–4.0)
MCH: 33.7 pg (ref 26.0–34.0)
MCHC: 34.1 g/dL (ref 30.0–36.0)
MCV: 98.8 fL (ref 80.0–100.0)
Monocytes Absolute: 1.1 10*3/uL — ABNORMAL HIGH (ref 0.1–1.0)
Monocytes Relative: 9 %
Neutro Abs: 9.4 10*3/uL — ABNORMAL HIGH (ref 1.7–7.7)
Neutrophils Relative %: 82 %
Platelets: 299 10*3/uL (ref 150–400)
RBC: 2.52 MIL/uL — ABNORMAL LOW (ref 4.22–5.81)
RDW: 13.6 % (ref 11.5–15.5)
WBC: 11.5 10*3/uL — ABNORMAL HIGH (ref 4.0–10.5)
nRBC: 0 % (ref 0.0–0.2)

## 2022-09-21 LAB — PROCALCITONIN: Procalcitonin: 0.43 ng/mL

## 2022-09-21 LAB — C-REACTIVE PROTEIN: CRP: 15.3 mg/dL — ABNORMAL HIGH (ref <1.0)

## 2022-09-21 LAB — BRAIN NATRIURETIC PEPTIDE: B Natriuretic Peptide: 188.6 pg/mL — ABNORMAL HIGH (ref 0.0–100.0)

## 2022-09-21 LAB — MAGNESIUM: Magnesium: 2.2 mg/dL (ref 1.7–2.4)

## 2022-09-21 MED ORDER — QUETIAPINE FUMARATE 25 MG PO TABS
25.0000 mg | ORAL_TABLET | Freq: Every day | ORAL | Status: DC
  Administered 2022-09-21 – 2022-09-22 (×2): 25 mg via ORAL
  Filled 2022-09-21 (×2): qty 1

## 2022-09-21 MED ORDER — SODIUM CHLORIDE 0.9 % IV SOLN: INTRAVENOUS | Status: DC | PRN

## 2022-09-21 MED ORDER — POTASSIUM CHLORIDE CRYS ER 20 MEQ PO TBCR
40.0000 meq | EXTENDED_RELEASE_TABLET | Freq: Once | ORAL | Status: AC
  Administered 2022-09-21: 40 meq via ORAL
  Filled 2022-09-21: qty 2

## 2022-09-21 MED ORDER — DEXTROSE IN LACTATED RINGERS 5 % IV SOLN: INTRAVENOUS | Status: DC

## 2022-09-21 MED ORDER — SODIUM CHLORIDE 0.9 % IV SOLN: 12.5000 mg | Freq: Three times a day (TID) | INTRAVENOUS | Status: DC | PRN

## 2022-09-21 MED ORDER — PANTOPRAZOLE SODIUM 40 MG IV SOLR
40.0000 mg | Freq: Two times a day (BID) | INTRAVENOUS | Status: DC
  Administered 2022-09-21 – 2022-09-23 (×5): 40 mg via INTRAVENOUS
  Filled 2022-09-21 (×5): qty 10

## 2022-09-21 NOTE — Progress Notes (Signed)
Pharmacy Antibiotic Note  Jesse [name] is a 77 y.o. male admitted on 09/12/2022 with aspiration pneumonia.  Pharmacy has been consulted for Unasyn dosing.  He has been encephalopathic with waxing and waning mental status per MD. His white blood cell count has trended down from a peak of 18k to 11.5k today. He is afebrile and on room air.   Plan: Continue Unasyn 3g IV q8h Monitor for clinical course, renal function and deescalation.   Height: 5\' 9"  (175.3 cm) Weight: 87 kg (191 lb 12.8 oz) IBW/kg (Calculated) : 70.7  Temp (24hrs), Avg:98.3 F (36.8 C), Min:98 F (36.7 C), Max:98.8 F (37.1 C)  Recent Labs  Lab 09/15/22 1737 09/16/22 0551 09/17/22 0414 09/19/22 0619 09/20/22 0827 09/21/22 0319  WBC 11.3* 7.8  --  11.2* 18.0* 11.5*  CREATININE  --  2.06* 1.89* 1.94* 2.19* 2.01*     Estimated Creatinine Clearance: 33.6 mL/min (A) (by C-G formula based on SCr of 2.01 mg/dL (H)).    Allergies  Allergen Reactions   Hyzaar [Losartan Potassium-Hctz] Other (See Comments)    Renal failure    Paroxetine Diarrhea   Sertraline Hcl Diarrhea    Antimicrobials this admission: 5/9 Unasyn >>  Dose adjustments this admission: N/a  Microbiology results: 5/3 BCx: NGF   Blane Ohara, PharmD  PGY1 Pharmacy Resident
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
## 2022-09-22 ENCOUNTER — Inpatient Hospital Stay (HOSPITAL_COMMUNITY): Payer: PPO

## 2022-09-22 DIAGNOSIS — I2510 Atherosclerotic heart disease of native coronary artery without angina pectoris: Secondary | ICD-10-CM | POA: Diagnosis not present

## 2022-09-22 DIAGNOSIS — N179 Acute kidney failure, unspecified: Secondary | ICD-10-CM | POA: Diagnosis not present

## 2022-09-22 LAB — BRAIN NATRIURETIC PEPTIDE: B Natriuretic Peptide: 364.3 pg/mL — ABNORMAL HIGH (ref 0.0–100.0)

## 2022-09-22 LAB — MAGNESIUM: Magnesium: 2 mg/dL (ref 1.7–2.4)

## 2022-09-22 LAB — PROCALCITONIN: Procalcitonin: 0.28 ng/mL

## 2022-09-22 LAB — CBC WITH DIFFERENTIAL/PLATELET
Abs Immature Granulocytes: 0.04 10*3/uL (ref 0.00–0.07)
Basophils Absolute: 0 10*3/uL (ref 0.0–0.1)
Basophils Relative: 0 %
Eosinophils Absolute: 0 10*3/uL (ref 0.0–0.5)
Eosinophils Relative: 0 %
HCT: 22.8 % — ABNORMAL LOW (ref 39.0–52.0)
Hemoglobin: 7.5 g/dL — ABNORMAL LOW (ref 13.0–17.0)
Immature Granulocytes: 1 %
Lymphocytes Relative: 13 %
Lymphs Abs: 1.1 10*3/uL (ref 0.7–4.0)
MCH: 32.9 pg (ref 26.0–34.0)
MCHC: 32.9 g/dL (ref 30.0–36.0)
MCV: 100 fL (ref 80.0–100.0)
Monocytes Absolute: 0.9 10*3/uL (ref 0.1–1.0)
Monocytes Relative: 11 %
Neutro Abs: 6.4 10*3/uL (ref 1.7–7.7)
Neutrophils Relative %: 75 %
Platelets: 267 10*3/uL (ref 150–400)
RBC: 2.28 MIL/uL — ABNORMAL LOW (ref 4.22–5.81)
RDW: 13.7 % (ref 11.5–15.5)
WBC: 8.5 10*3/uL (ref 4.0–10.5)
nRBC: 0 % (ref 0.0–0.2)

## 2022-09-22 LAB — COMPREHENSIVE METABOLIC PANEL
ALT: 43 U/L (ref 0–44)
AST: 40 U/L (ref 15–41)
Albumin: 2.1 g/dL — ABNORMAL LOW (ref 3.5–5.0)
Alkaline Phosphatase: 78 U/L (ref 38–126)
Anion gap: 6 (ref 5–15)
BUN: 41 mg/dL — ABNORMAL HIGH (ref 8–23)
CO2: 23 mmol/L (ref 22–32)
Calcium: 8.1 mg/dL — ABNORMAL LOW (ref 8.9–10.3)
Chloride: 107 mmol/L (ref 98–111)
Creatinine, Ser: 1.67 mg/dL — ABNORMAL HIGH (ref 0.61–1.24)
GFR, Estimated: 42 mL/min — ABNORMAL LOW (ref >60)
Glucose, Bld: 126 mg/dL — ABNORMAL HIGH (ref 70–99)
Potassium: 3.8 mmol/L (ref 3.5–5.1)
Sodium: 136 mmol/L (ref 135–145)
Total Bilirubin: 0.8 mg/dL (ref 0.3–1.2)
Total Protein: 5.3 g/dL — ABNORMAL LOW (ref 6.5–8.1)

## 2022-09-22 LAB — C-REACTIVE PROTEIN: CRP: 6.3 mg/dL — ABNORMAL HIGH (ref <1.0)

## 2022-09-22 MED ORDER — BISACODYL 10 MG RE SUPP
10.0000 mg | Freq: Every day | RECTAL | Status: DC
  Administered 2022-09-22: 10 mg via RECTAL
  Filled 2022-09-22: qty 1

## 2022-09-22 MED ORDER — TRAMADOL HCL 50 MG PO TABS
50.0000 mg | ORAL_TABLET | Freq: Four times a day (QID) | ORAL | Status: DC | PRN
  Administered 2022-09-22 – 2022-09-23 (×2): 50 mg via ORAL
  Filled 2022-09-22 (×2): qty 1

## 2022-09-22 MED ORDER — OXYCODONE HCL 5 MG PO TABS
5.0000 mg | ORAL_TABLET | Freq: Once | ORAL | Status: AC
  Administered 2022-09-22: 5 mg via ORAL
  Filled 2022-09-22: qty 1

## 2022-09-22 NOTE — Plan of Care (Signed)
  Problem: Education: Goal: Knowledge of General Education information will improve Description: Including pain rating scale, medication(s)/side effects and non-pharmacologic comfort measures Outcome: Progressing   Problem: Elimination: Goal: Will not experience complications related to bowel motility Outcome: Progressing   Problem: Pain Managment: Goal: General experience of comfort will improve Outcome: Progressing   
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
## 2022-09-22 NOTE — Progress Notes (Signed)
Mobility Specialist Progress Note    09/22/22 1026  Mobility  Activity Transferred from bed to chair  Level of Assistance Moderate assist, patient does 50-74%  Assistive Device Front wheel walker  Distance Ambulated (ft) 2 ft  RLE Weight Bearing PWB  LLE Weight Bearing WBAT  Activity Response Tolerated well  Mobility Referral Yes  $Mobility charge 1 Mobility  Mobility Specialist Start Time (ACUTE ONLY) 1012  Mobility Specialist Stop Time (ACUTE ONLY) 1025  Mobility Specialist Time Calculation (min) (ACUTE ONLY) 13 min   Pt received in bed and agreeable w/ encouragement. Pt required cueing for focusing on task and hand placement for standing. No complaints. Left in chair with call bell in reach, alarm on, and family present.   [place] Mobility Specialist  Please Neurosurgeon or Rehab Office at [phone]
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
## 2022-09-22 NOTE — TOC Progression Note (Signed)
Transition of Care [facility]) - Progression Note   Patient Details  Name: Jesse [name] MRN: [id number] Date of Birth: May 16, 1945  Transition of Care [facility]) CM/SW Contact  Helene Kelp, [place] Phone Number: 09/22/2022, 4:23 PM  Clinical Narrative:    CSW met with the patient and daughter Jesse [name] [phone]) at the bedside. CSW reviewed the patient's disposition with them and addressed any concerns they might of have. The patient noted he would like to go to the SNF [facility]/[phone]) on 09/23/22.   This Clinical research associate contacted [facility] and spoke with rep: Gala Murdoch, who expressed the patient is to transfer to the facility on 09/23/22 with a room number 105. Gala Murdoch noted the admissions nurse Marcelino Duster) who handles the admission was not there during the time, but will be there tomorrow.   This Clinical research associate updated Administrator, Civil Service to the information noted above, and expressed the unit SW will arrange transport in the AM.  Disposition follow-up: Unit SW to arrange transport [facility] in the AM and compete d/c process.    No other needs identified at this current time for this Clinical research associate. Unit CSW to follow and continue efforts and progress made to support the patient's disposition.   Expected Discharge Plan: Skilled Nursing Facility Barriers to Discharge: Continued Medical Work up  Expected Discharge Plan and Services In-house Referral: Clinical Social Work   Post Acute Care Choice: Skilled Nursing Facility Living arrangements for the past 2 months: Single Family Home Social Determinants of Health (SDOH) Interventions SDOH Screenings   Food Insecurity: No Food Insecurity (09/13/2022)  Housing: Low Risk  (09/13/2022)  Transportation Needs: No Transportation Needs (09/13/2022)  Utilities: Not At Risk (09/13/2022)  Depression (PHQ2-9): Low Risk  (08/14/2021)  Tobacco Use: Medium Risk (09/13/2022)    Readmission Risk Interventions     No data to display

## 2022-09-23 DIAGNOSIS — J690 Pneumonitis due to inhalation of food and vomit: Secondary | ICD-10-CM | POA: Diagnosis not present

## 2022-09-23 DIAGNOSIS — R4182 Altered mental status, unspecified: Secondary | ICD-10-CM | POA: Diagnosis not present

## 2022-09-23 DIAGNOSIS — G9341 Metabolic encephalopathy: Secondary | ICD-10-CM | POA: Diagnosis not present

## 2022-09-23 DIAGNOSIS — D6489 Other specified anemias: Secondary | ICD-10-CM | POA: Diagnosis not present

## 2022-09-23 DIAGNOSIS — R112 Nausea with vomiting, unspecified: Secondary | ICD-10-CM | POA: Diagnosis not present

## 2022-09-23 DIAGNOSIS — F419 Anxiety disorder, unspecified: Secondary | ICD-10-CM | POA: Diagnosis not present

## 2022-09-23 DIAGNOSIS — D649 Anemia, unspecified: Secondary | ICD-10-CM | POA: Diagnosis not present

## 2022-09-23 DIAGNOSIS — N179 Acute kidney failure, unspecified: Secondary | ICD-10-CM | POA: Diagnosis not present

## 2022-09-23 DIAGNOSIS — S32414D Nondisplaced fracture of anterior wall of right acetabulum, subsequent encounter for fracture with routine healing: Secondary | ICD-10-CM | POA: Diagnosis not present

## 2022-09-23 DIAGNOSIS — Z7401 Bed confinement status: Secondary | ICD-10-CM | POA: Diagnosis not present

## 2022-09-23 DIAGNOSIS — S3289XD Fracture of other parts of pelvis, subsequent encounter for fracture with routine healing: Secondary | ICD-10-CM | POA: Diagnosis not present

## 2022-09-23 DIAGNOSIS — M6282 Rhabdomyolysis: Secondary | ICD-10-CM | POA: Diagnosis not present

## 2022-09-23 DIAGNOSIS — I680 Cerebral amyloid angiopathy: Secondary | ICD-10-CM | POA: Diagnosis not present

## 2022-09-23 DIAGNOSIS — K5909 Other constipation: Secondary | ICD-10-CM | POA: Diagnosis not present

## 2022-09-23 DIAGNOSIS — R059 Cough, unspecified: Secondary | ICD-10-CM | POA: Diagnosis not present

## 2022-09-23 DIAGNOSIS — R140 Abdominal distension (gaseous): Secondary | ICD-10-CM | POA: Diagnosis not present

## 2022-09-23 DIAGNOSIS — G4733 Obstructive sleep apnea (adult) (pediatric): Secondary | ICD-10-CM | POA: Diagnosis not present

## 2022-09-23 DIAGNOSIS — D72829 Elevated white blood cell count, unspecified: Secondary | ICD-10-CM | POA: Diagnosis not present

## 2022-09-23 DIAGNOSIS — R41841 Cognitive communication deficit: Secondary | ICD-10-CM | POA: Diagnosis not present

## 2022-09-23 DIAGNOSIS — I129 Hypertensive chronic kidney disease with stage 1 through stage 4 chronic kidney disease, or unspecified chronic kidney disease: Secondary | ICD-10-CM | POA: Diagnosis not present

## 2022-09-23 DIAGNOSIS — R2689 Other abnormalities of gait and mobility: Secondary | ICD-10-CM | POA: Diagnosis not present

## 2022-09-23 DIAGNOSIS — S3210XD Unspecified fracture of sacrum, subsequent encounter for fracture with routine healing: Secondary | ICD-10-CM | POA: Diagnosis not present

## 2022-09-23 DIAGNOSIS — N390 Urinary tract infection, site not specified: Secondary | ICD-10-CM | POA: Diagnosis not present

## 2022-09-23 DIAGNOSIS — I10 Essential (primary) hypertension: Secondary | ICD-10-CM | POA: Diagnosis not present

## 2022-09-23 DIAGNOSIS — R0989 Other specified symptoms and signs involving the circulatory and respiratory systems: Secondary | ICD-10-CM | POA: Diagnosis not present

## 2022-09-23 DIAGNOSIS — I2510 Atherosclerotic heart disease of native coronary artery without angina pectoris: Secondary | ICD-10-CM | POA: Diagnosis not present

## 2022-09-23 DIAGNOSIS — M6281 Muscle weakness (generalized): Secondary | ICD-10-CM | POA: Diagnosis not present

## 2022-09-23 DIAGNOSIS — S32519A Fracture of superior rim of unspecified pubis, initial encounter for closed fracture: Secondary | ICD-10-CM | POA: Diagnosis not present

## 2022-09-23 DIAGNOSIS — S32591D Other specified fracture of right pubis, subsequent encounter for fracture with routine healing: Secondary | ICD-10-CM | POA: Diagnosis not present

## 2022-09-23 DIAGNOSIS — N1831 Chronic kidney disease, stage 3a: Secondary | ICD-10-CM | POA: Diagnosis not present

## 2022-09-23 DIAGNOSIS — R410 Disorientation, unspecified: Secondary | ICD-10-CM | POA: Diagnosis not present

## 2022-09-23 DIAGNOSIS — R442 Other hallucinations: Secondary | ICD-10-CM | POA: Diagnosis not present

## 2022-09-23 DIAGNOSIS — N183 Chronic kidney disease, stage 3 unspecified: Secondary | ICD-10-CM | POA: Diagnosis not present

## 2022-09-23 DIAGNOSIS — R278 Other lack of coordination: Secondary | ICD-10-CM | POA: Diagnosis not present

## 2022-09-23 DIAGNOSIS — F4320 Adjustment disorder, unspecified: Secondary | ICD-10-CM | POA: Diagnosis not present

## 2022-09-23 DIAGNOSIS — S32401A Unspecified fracture of right acetabulum, initial encounter for closed fracture: Secondary | ICD-10-CM | POA: Diagnosis not present

## 2022-09-23 LAB — CBC WITH DIFFERENTIAL/PLATELET
Abs Immature Granulocytes: 0.06 10*3/uL (ref 0.00–0.07)
Basophils Absolute: 0 10*3/uL (ref 0.0–0.1)
Basophils Relative: 0 %
Eosinophils Absolute: 0.1 10*3/uL (ref 0.0–0.5)
Eosinophils Relative: 1 %
HCT: 22.9 % — ABNORMAL LOW (ref 39.0–52.0)
Hemoglobin: 7.6 g/dL — ABNORMAL LOW (ref 13.0–17.0)
Immature Granulocytes: 1 %
Lymphocytes Relative: 13 %
Lymphs Abs: 1.4 10*3/uL (ref 0.7–4.0)
MCH: 33.5 pg (ref 26.0–34.0)
MCHC: 33.2 g/dL (ref 30.0–36.0)
MCV: 100.9 fL — ABNORMAL HIGH (ref 80.0–100.0)
Monocytes Absolute: 1.1 10*3/uL — ABNORMAL HIGH (ref 0.1–1.0)
Monocytes Relative: 10 %
Neutro Abs: 8.3 10*3/uL — ABNORMAL HIGH (ref 1.7–7.7)
Neutrophils Relative %: 75 %
Platelets: 276 10*3/uL (ref 150–400)
RBC: 2.27 MIL/uL — ABNORMAL LOW (ref 4.22–5.81)
RDW: 13.2 % (ref 11.5–15.5)
WBC: 11 10*3/uL — ABNORMAL HIGH (ref 4.0–10.5)
nRBC: 0 % (ref 0.0–0.2)

## 2022-09-23 LAB — COMPREHENSIVE METABOLIC PANEL
ALT: 49 U/L — ABNORMAL HIGH (ref 0–44)
AST: 44 U/L — ABNORMAL HIGH (ref 15–41)
Albumin: 2.2 g/dL — ABNORMAL LOW (ref 3.5–5.0)
Alkaline Phosphatase: 89 U/L (ref 38–126)
Anion gap: 4 — ABNORMAL LOW (ref 5–15)
BUN: 33 mg/dL — ABNORMAL HIGH (ref 8–23)
CO2: 22 mmol/L (ref 22–32)
Calcium: 8 mg/dL — ABNORMAL LOW (ref 8.9–10.3)
Chloride: 106 mmol/L (ref 98–111)
Creatinine, Ser: 1.54 mg/dL — ABNORMAL HIGH (ref 0.61–1.24)
GFR, Estimated: 46 mL/min — ABNORMAL LOW (ref >60)
Glucose, Bld: 99 mg/dL (ref 70–99)
Potassium: 3.5 mmol/L (ref 3.5–5.1)
Sodium: 132 mmol/L — ABNORMAL LOW (ref 135–145)
Total Bilirubin: 1 mg/dL (ref 0.3–1.2)
Total Protein: 5.2 g/dL — ABNORMAL LOW (ref 6.5–8.1)

## 2022-09-23 LAB — PROCALCITONIN: Procalcitonin: 0.14 ng/mL

## 2022-09-23 LAB — C-REACTIVE PROTEIN: CRP: 4.3 mg/dL — ABNORMAL HIGH (ref <1.0)

## 2022-09-23 LAB — MAGNESIUM: Magnesium: 1.8 mg/dL (ref 1.7–2.4)

## 2022-09-23 LAB — BRAIN NATRIURETIC PEPTIDE: B Natriuretic Peptide: 387.7 pg/mL — ABNORMAL HIGH (ref 0.0–100.0)

## 2022-09-23 MED ORDER — DOCUSATE SODIUM 100 MG PO CAPS: 100.0000 mg | ORAL_CAPSULE | Freq: Two times a day (BID) | ORAL | Status: DC

## 2022-09-23 MED ORDER — ASPIRIN EC 81 MG PO TBEC: 81.0000 mg | DELAYED_RELEASE_TABLET | Freq: Every evening | ORAL | 11 refills | Status: AC

## 2022-09-23 MED ORDER — TAMSULOSIN HCL 0.4 MG PO CAPS: 0.4000 mg | ORAL_CAPSULE | Freq: Every day | ORAL | Status: DC

## 2022-09-23 MED ORDER — CARVEDILOL 3.125 MG PO TABS: 3.1250 mg | ORAL_TABLET | Freq: Two times a day (BID) | ORAL | Status: DC

## 2022-09-23 MED ORDER — BISACODYL 5 MG PO TBEC: 5.0000 mg | DELAYED_RELEASE_TABLET | Freq: Every day | ORAL | 1 refills | Status: DC | PRN

## 2022-09-23 MED ORDER — QUETIAPINE FUMARATE 25 MG PO TABS: 25.0000 mg | ORAL_TABLET | Freq: Every day | ORAL | Status: DC

## 2022-09-23 MED ORDER — AMOXICILLIN-POT CLAVULANATE 875-125 MG PO TABS: 1.0000 | ORAL_TABLET | Freq: Two times a day (BID) | ORAL | 0 refills | Status: DC

## 2022-09-23 NOTE — Progress Notes (Signed)
Attempted to call report to receiving SNF, receiving RN unable to take call/report and will call this RN back.
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
## 2022-09-23 NOTE — Progress Notes (Signed)
Report called to receiving RN, all questions answered.

## 2022-09-23 NOTE — TOC Transition Note (Signed)
Transition of Care [facility]) - CM/SW Discharge Note   Patient Details  Name: [name] MRN: [id number] Date of Birth: 1946-02-16  [facility] CM/SW Contact:  Baldemar Lenis, LCSW Phone Number: 09/23/2022, 12:42 PM   Clinical Narrative:   CSW confirmed with MD that patient medically stable, sent discharge summary to [facility] Confirmed with [facility] that bed is available. Transport arranged with [facility] for next available transport. CSW left a voicemail for spouse, Jesse [name], to provide update. No further TOC needs.    Final next level of care: Skilled Nursing Facility Barriers to Discharge: Barriers Resolved   Patient Goals and CMS Choice CMS Medicare.gov Compare Post Acute Care list provided to:: Patient Represenative (must comment) Choice offered to / list presented to : Spouse  Discharge Placement                Patient chooses bed at: [facility] Patient to be transferred to facility by: [facility] Name of family member notified: Left voicemail for wife, Jesse [name] Patient and family notified of of transfer: 09/23/22  Discharge Plan and Services Additional resources added to the After Visit Summary for   In-house Referral: Clinical Social Work   Post Acute Care Choice: Skilled Nursing Facility                               Social Determinants of Health (SDOH) Interventions SDOH Screenings   Food Insecurity: No Food Insecurity (09/13/2022)  Housing: Low Risk  (09/13/2022)  Transportation Needs: No Transportation Needs (09/13/2022)  Utilities: Not At Risk (09/13/2022)  Depression (PHQ2-9): Low Risk  (08/14/2021)  Tobacco Use: Medium Risk (09/13/2022)     Readmission Risk Interventions     No data to display
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
## 2022-09-23 NOTE — TOC CAGE-AID Note (Signed)
Transition of Care [facility]) - CAGE-AID Screening  Patient Details  Name: Jesse [name] MRN: [id number] Date of Birth: [birth year]  Clinical Narrative:  Patient denies alcohol and drug use, no need for resources at this time.  CAGE-AID Screening:    Have You Ever Felt You Ought to Cut Down on Your Drinking or Drug Use?: No Have People Annoyed You By Critizing Your Drinking Or Drug Use?: No Have You Felt Bad Or Guilty About Your Drinking Or Drug Use?: No Have You Ever Had a Drink or Used Drugs First Thing In The Morning to Steady Your Nerves or to Get Rid of a Hangover?: No CAGE-AID Score: 0  Substance Abuse Education Offered: No

## 2022-09-23 NOTE — Discharge Instructions (Signed)
Follow with Primary MD Eustaquio Boyden, MD in 7 days   Get CBC, CMP, Magnesium, 2 view Chest X ray -  checked next visit with your primary MD or SNF MD    Activity: 50% weightbearing on RLE x 6 weeks  as tolerated with Full fall precautions use walker/cane & assistance as needed  Disposition SNF  Diet: Soft diet with full feeding assistance and aspiration precautions, close SLP follow-up at SNF.  All meals sitting up.  Special Instructions: If you have smoked or chewed Tobacco  in the last 2 yrs please stop smoking, stop any regular Alcohol  and or any Recreational drug use.  On your next visit with your primary care physician please Get Medicines reviewed and adjusted.  Please request your Prim.MD to go over all Hospital Tests and Procedure/Radiological results at the follow up, please get all Hospital records sent to your Prim MD by signing hospital release before you go home.  If you experience worsening of your admission symptoms, develop shortness of breath, life threatening emergency, suicidal or homicidal thoughts you must seek medical attention immediately by calling 911 or calling your MD immediately  if symptoms less severe.  You Must read complete instructions/literature along with all the possible adverse reactions/side effects for all the Medicines you take and that have been prescribed to you. Take any new Medicines after you have completely understood and accpet all the possible adverse reactions/side effects.

## 2022-09-24 DIAGNOSIS — G9341 Metabolic encephalopathy: Secondary | ICD-10-CM | POA: Diagnosis not present

## 2022-09-24 DIAGNOSIS — S32401A Unspecified fracture of right acetabulum, initial encounter for closed fracture: Secondary | ICD-10-CM | POA: Diagnosis not present

## 2022-09-24 DIAGNOSIS — S32519A Fracture of superior rim of unspecified pubis, initial encounter for closed fracture: Secondary | ICD-10-CM | POA: Diagnosis not present

## 2022-09-24 DIAGNOSIS — I680 Cerebral amyloid angiopathy: Secondary | ICD-10-CM | POA: Diagnosis not present

## 2022-09-24 DIAGNOSIS — I10 Essential (primary) hypertension: Secondary | ICD-10-CM | POA: Diagnosis not present

## 2022-09-24 DIAGNOSIS — N1831 Chronic kidney disease, stage 3a: Secondary | ICD-10-CM | POA: Diagnosis not present

## 2022-09-24 DIAGNOSIS — M6281 Muscle weakness (generalized): Secondary | ICD-10-CM | POA: Diagnosis not present

## 2022-09-24 DIAGNOSIS — M6282 Rhabdomyolysis: Secondary | ICD-10-CM | POA: Diagnosis not present

## 2022-09-24 DIAGNOSIS — J690 Pneumonitis due to inhalation of food and vomit: Secondary | ICD-10-CM | POA: Diagnosis not present

## 2022-09-24 DIAGNOSIS — D649 Anemia, unspecified: Secondary | ICD-10-CM | POA: Diagnosis not present

## 2022-09-24 DIAGNOSIS — I129 Hypertensive chronic kidney disease with stage 1 through stage 4 chronic kidney disease, or unspecified chronic kidney disease: Secondary | ICD-10-CM | POA: Diagnosis not present

## 2022-09-24 NOTE — Care Management Important Message (Signed)
Important Message  Patient Details  Name: Jesse [name] MRN: [id number] Date of Birth: 10-29-1945   Medicare Important Message Given:  Yes  Patient left prior to IM delivery will mail the copy to the patient home address.    [name] 09/24/2022, 12:00 PM

## 2022-09-25 DIAGNOSIS — S32591D Other specified fracture of right pubis, subsequent encounter for fracture with routine healing: Secondary | ICD-10-CM | POA: Diagnosis not present

## 2022-09-25 DIAGNOSIS — R2689 Other abnormalities of gait and mobility: Secondary | ICD-10-CM | POA: Diagnosis not present

## 2022-09-25 DIAGNOSIS — M6281 Muscle weakness (generalized): Secondary | ICD-10-CM | POA: Diagnosis not present

## 2022-09-25 DIAGNOSIS — R278 Other lack of coordination: Secondary | ICD-10-CM | POA: Diagnosis not present

## 2022-09-26 DIAGNOSIS — S32414D Nondisplaced fracture of anterior wall of right acetabulum, subsequent encounter for fracture with routine healing: Secondary | ICD-10-CM | POA: Diagnosis not present

## 2022-09-27 DIAGNOSIS — S32519A Fracture of superior rim of unspecified pubis, initial encounter for closed fracture: Secondary | ICD-10-CM | POA: Diagnosis not present

## 2022-09-27 DIAGNOSIS — N1831 Chronic kidney disease, stage 3a: Secondary | ICD-10-CM | POA: Diagnosis not present

## 2022-09-27 DIAGNOSIS — G9341 Metabolic encephalopathy: Secondary | ICD-10-CM | POA: Diagnosis not present

## 2022-09-27 DIAGNOSIS — M6282 Rhabdomyolysis: Secondary | ICD-10-CM | POA: Diagnosis not present

## 2022-09-27 DIAGNOSIS — J690 Pneumonitis due to inhalation of food and vomit: Secondary | ICD-10-CM | POA: Diagnosis not present

## 2022-09-27 DIAGNOSIS — I680 Cerebral amyloid angiopathy: Secondary | ICD-10-CM | POA: Diagnosis not present

## 2022-09-27 DIAGNOSIS — S32401A Unspecified fracture of right acetabulum, initial encounter for closed fracture: Secondary | ICD-10-CM | POA: Diagnosis not present

## 2022-09-27 DIAGNOSIS — M6281 Muscle weakness (generalized): Secondary | ICD-10-CM | POA: Diagnosis not present

## 2022-09-27 DIAGNOSIS — D649 Anemia, unspecified: Secondary | ICD-10-CM | POA: Diagnosis not present

## 2022-09-27 DIAGNOSIS — I10 Essential (primary) hypertension: Secondary | ICD-10-CM | POA: Diagnosis not present

## 2022-09-30 DIAGNOSIS — I10 Essential (primary) hypertension: Secondary | ICD-10-CM | POA: Diagnosis not present

## 2022-09-30 DIAGNOSIS — D649 Anemia, unspecified: Secondary | ICD-10-CM | POA: Diagnosis not present

## 2022-09-30 DIAGNOSIS — S32519A Fracture of superior rim of unspecified pubis, initial encounter for closed fracture: Secondary | ICD-10-CM | POA: Diagnosis not present

## 2022-09-30 DIAGNOSIS — I680 Cerebral amyloid angiopathy: Secondary | ICD-10-CM | POA: Diagnosis not present

## 2022-09-30 DIAGNOSIS — S32401A Unspecified fracture of right acetabulum, initial encounter for closed fracture: Secondary | ICD-10-CM | POA: Diagnosis not present

## 2022-09-30 DIAGNOSIS — I129 Hypertensive chronic kidney disease with stage 1 through stage 4 chronic kidney disease, or unspecified chronic kidney disease: Secondary | ICD-10-CM | POA: Diagnosis not present

## 2022-09-30 DIAGNOSIS — M6282 Rhabdomyolysis: Secondary | ICD-10-CM | POA: Diagnosis not present

## 2022-09-30 DIAGNOSIS — J690 Pneumonitis due to inhalation of food and vomit: Secondary | ICD-10-CM | POA: Diagnosis not present

## 2022-09-30 DIAGNOSIS — M6281 Muscle weakness (generalized): Secondary | ICD-10-CM | POA: Diagnosis not present

## 2022-09-30 DIAGNOSIS — N1831 Chronic kidney disease, stage 3a: Secondary | ICD-10-CM | POA: Diagnosis not present

## 2022-09-30 DIAGNOSIS — G9341 Metabolic encephalopathy: Secondary | ICD-10-CM | POA: Diagnosis not present

## 2022-09-30 DIAGNOSIS — F419 Anxiety disorder, unspecified: Secondary | ICD-10-CM | POA: Diagnosis not present

## 2022-10-01 ENCOUNTER — Telehealth: Payer: Self-pay | Admitting: Family Medicine
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
## 2022-10-01 NOTE — Telephone Encounter (Signed)
Placed form in Dr. G's box.  

## 2022-10-02 DIAGNOSIS — M6281 Muscle weakness (generalized): Secondary | ICD-10-CM | POA: Diagnosis not present

## 2022-10-02 DIAGNOSIS — D649 Anemia, unspecified: Secondary | ICD-10-CM | POA: Diagnosis not present

## 2022-10-02 DIAGNOSIS — J690 Pneumonitis due to inhalation of food and vomit: Secondary | ICD-10-CM | POA: Diagnosis not present

## 2022-10-02 DIAGNOSIS — S32401A Unspecified fracture of right acetabulum, initial encounter for closed fracture: Secondary | ICD-10-CM | POA: Diagnosis not present

## 2022-10-02 DIAGNOSIS — I10 Essential (primary) hypertension: Secondary | ICD-10-CM | POA: Diagnosis not present

## 2022-10-02 DIAGNOSIS — M6282 Rhabdomyolysis: Secondary | ICD-10-CM | POA: Diagnosis not present

## 2022-10-02 DIAGNOSIS — I129 Hypertensive chronic kidney disease with stage 1 through stage 4 chronic kidney disease, or unspecified chronic kidney disease: Secondary | ICD-10-CM | POA: Diagnosis not present

## 2022-10-02 DIAGNOSIS — N1831 Chronic kidney disease, stage 3a: Secondary | ICD-10-CM | POA: Diagnosis not present

## 2022-10-02 DIAGNOSIS — I680 Cerebral amyloid angiopathy: Secondary | ICD-10-CM | POA: Diagnosis not present

## 2022-10-02 DIAGNOSIS — G9341 Metabolic encephalopathy: Secondary | ICD-10-CM | POA: Diagnosis not present

## 2022-10-02 DIAGNOSIS — F419 Anxiety disorder, unspecified: Secondary | ICD-10-CM | POA: Diagnosis not present

## 2022-10-02 DIAGNOSIS — S32519A Fracture of superior rim of unspecified pubis, initial encounter for closed fracture: Secondary | ICD-10-CM | POA: Diagnosis not present

## 2022-10-03 DIAGNOSIS — J690 Pneumonitis due to inhalation of food and vomit: Secondary | ICD-10-CM | POA: Diagnosis not present

## 2022-10-03 DIAGNOSIS — S32519A Fracture of superior rim of unspecified pubis, initial encounter for closed fracture: Secondary | ICD-10-CM | POA: Diagnosis not present

## 2022-10-03 DIAGNOSIS — I680 Cerebral amyloid angiopathy: Secondary | ICD-10-CM | POA: Diagnosis not present

## 2022-10-03 DIAGNOSIS — M6281 Muscle weakness (generalized): Secondary | ICD-10-CM | POA: Diagnosis not present

## 2022-10-03 DIAGNOSIS — I10 Essential (primary) hypertension: Secondary | ICD-10-CM | POA: Diagnosis not present

## 2022-10-03 DIAGNOSIS — G9341 Metabolic encephalopathy: Secondary | ICD-10-CM | POA: Diagnosis not present

## 2022-10-03 DIAGNOSIS — S32401A Unspecified fracture of right acetabulum, initial encounter for closed fracture: Secondary | ICD-10-CM | POA: Diagnosis not present

## 2022-10-03 DIAGNOSIS — M6282 Rhabdomyolysis: Secondary | ICD-10-CM | POA: Diagnosis not present

## 2022-10-03 DIAGNOSIS — N1831 Chronic kidney disease, stage 3a: Secondary | ICD-10-CM | POA: Diagnosis not present

## 2022-10-03 DIAGNOSIS — D649 Anemia, unspecified: Secondary | ICD-10-CM | POA: Diagnosis not present

## 2022-10-04 DIAGNOSIS — I10 Essential (primary) hypertension: Secondary | ICD-10-CM | POA: Diagnosis not present

## 2022-10-04 DIAGNOSIS — I680 Cerebral amyloid angiopathy: Secondary | ICD-10-CM | POA: Diagnosis not present

## 2022-10-04 DIAGNOSIS — G9341 Metabolic encephalopathy: Secondary | ICD-10-CM | POA: Diagnosis not present

## 2022-10-04 DIAGNOSIS — M6282 Rhabdomyolysis: Secondary | ICD-10-CM | POA: Diagnosis not present

## 2022-10-04 DIAGNOSIS — N1831 Chronic kidney disease, stage 3a: Secondary | ICD-10-CM | POA: Diagnosis not present

## 2022-10-04 DIAGNOSIS — F419 Anxiety disorder, unspecified: Secondary | ICD-10-CM | POA: Diagnosis not present

## 2022-10-04 DIAGNOSIS — S32401A Unspecified fracture of right acetabulum, initial encounter for closed fracture: Secondary | ICD-10-CM | POA: Diagnosis not present

## 2022-10-04 DIAGNOSIS — J690 Pneumonitis due to inhalation of food and vomit: Secondary | ICD-10-CM | POA: Diagnosis not present

## 2022-10-04 DIAGNOSIS — D649 Anemia, unspecified: Secondary | ICD-10-CM | POA: Diagnosis not present

## 2022-10-04 DIAGNOSIS — M6281 Muscle weakness (generalized): Secondary | ICD-10-CM | POA: Diagnosis not present

## 2022-10-04 DIAGNOSIS — I129 Hypertensive chronic kidney disease with stage 1 through stage 4 chronic kidney disease, or unspecified chronic kidney disease: Secondary | ICD-10-CM | POA: Diagnosis not present

## 2022-10-04 DIAGNOSIS — S32519A Fracture of superior rim of unspecified pubis, initial encounter for closed fracture: Secondary | ICD-10-CM | POA: Diagnosis not present

## 2022-10-08 DIAGNOSIS — F419 Anxiety disorder, unspecified: Secondary | ICD-10-CM | POA: Diagnosis not present

## 2022-10-08 DIAGNOSIS — M6281 Muscle weakness (generalized): Secondary | ICD-10-CM | POA: Diagnosis not present

## 2022-10-08 DIAGNOSIS — D649 Anemia, unspecified: Secondary | ICD-10-CM | POA: Diagnosis not present

## 2022-10-08 DIAGNOSIS — S32401A Unspecified fracture of right acetabulum, initial encounter for closed fracture: Secondary | ICD-10-CM | POA: Diagnosis not present

## 2022-10-08 DIAGNOSIS — S32519A Fracture of superior rim of unspecified pubis, initial encounter for closed fracture: Secondary | ICD-10-CM | POA: Diagnosis not present

## 2022-10-08 DIAGNOSIS — I10 Essential (primary) hypertension: Secondary | ICD-10-CM | POA: Diagnosis not present

## 2022-10-08 DIAGNOSIS — I129 Hypertensive chronic kidney disease with stage 1 through stage 4 chronic kidney disease, or unspecified chronic kidney disease: Secondary | ICD-10-CM | POA: Diagnosis not present

## 2022-10-08 DIAGNOSIS — J690 Pneumonitis due to inhalation of food and vomit: Secondary | ICD-10-CM | POA: Diagnosis not present

## 2022-10-08 DIAGNOSIS — M6282 Rhabdomyolysis: Secondary | ICD-10-CM | POA: Diagnosis not present

## 2022-10-08 DIAGNOSIS — I680 Cerebral amyloid angiopathy: Secondary | ICD-10-CM | POA: Diagnosis not present

## 2022-10-08 DIAGNOSIS — G9341 Metabolic encephalopathy: Secondary | ICD-10-CM | POA: Diagnosis not present

## 2022-10-08 DIAGNOSIS — N1831 Chronic kidney disease, stage 3a: Secondary | ICD-10-CM | POA: Diagnosis not present

## 2022-10-09 DIAGNOSIS — R2689 Other abnormalities of gait and mobility: Secondary | ICD-10-CM | POA: Diagnosis not present

## 2022-10-09 DIAGNOSIS — M6281 Muscle weakness (generalized): Secondary | ICD-10-CM | POA: Diagnosis not present

## 2022-10-09 DIAGNOSIS — S32591D Other specified fracture of right pubis, subsequent encounter for fracture with routine healing: Secondary | ICD-10-CM | POA: Diagnosis not present

## 2022-10-09 DIAGNOSIS — R278 Other lack of coordination: Secondary | ICD-10-CM | POA: Diagnosis not present

## 2022-10-10 NOTE — Telephone Encounter (Addendum)
Pt is at [facility].  Form filled and in Lisa's box.  They need to fill out first page.

## 2022-10-11 DIAGNOSIS — N390 Urinary tract infection, site not specified: Secondary | ICD-10-CM | POA: Diagnosis not present

## 2022-10-11 DIAGNOSIS — I129 Hypertensive chronic kidney disease with stage 1 through stage 4 chronic kidney disease, or unspecified chronic kidney disease: Secondary | ICD-10-CM | POA: Diagnosis not present

## 2022-10-11 DIAGNOSIS — R4182 Altered mental status, unspecified: Secondary | ICD-10-CM | POA: Diagnosis not present

## 2022-10-11 DIAGNOSIS — D72829 Elevated white blood cell count, unspecified: Secondary | ICD-10-CM | POA: Diagnosis not present

## 2022-10-11 DIAGNOSIS — F419 Anxiety disorder, unspecified: Secondary | ICD-10-CM | POA: Diagnosis not present

## 2022-10-11 DIAGNOSIS — Z0279 Encounter for issue of other medical certificate: Secondary | ICD-10-CM

## 2022-10-11 DIAGNOSIS — R442 Other hallucinations: Secondary | ICD-10-CM | POA: Diagnosis not present

## 2022-10-14 NOTE — Telephone Encounter (Signed)
Did not see ppw. Do you still have it?

## 2022-10-15 DIAGNOSIS — M6281 Muscle weakness (generalized): Secondary | ICD-10-CM | POA: Diagnosis not present

## 2022-10-15 DIAGNOSIS — R2689 Other abnormalities of gait and mobility: Secondary | ICD-10-CM | POA: Diagnosis not present

## 2022-10-15 DIAGNOSIS — N1831 Chronic kidney disease, stage 3a: Secondary | ICD-10-CM | POA: Diagnosis not present

## 2022-10-15 DIAGNOSIS — S32519A Fracture of superior rim of unspecified pubis, initial encounter for closed fracture: Secondary | ICD-10-CM | POA: Diagnosis not present

## 2022-10-15 DIAGNOSIS — I680 Cerebral amyloid angiopathy: Secondary | ICD-10-CM | POA: Diagnosis not present

## 2022-10-15 DIAGNOSIS — S32401A Unspecified fracture of right acetabulum, initial encounter for closed fracture: Secondary | ICD-10-CM | POA: Diagnosis not present

## 2022-10-15 DIAGNOSIS — M6282 Rhabdomyolysis: Secondary | ICD-10-CM | POA: Diagnosis not present

## 2022-10-15 DIAGNOSIS — S32591D Other specified fracture of right pubis, subsequent encounter for fracture with routine healing: Secondary | ICD-10-CM | POA: Diagnosis not present

## 2022-10-15 DIAGNOSIS — R278 Other lack of coordination: Secondary | ICD-10-CM | POA: Diagnosis not present

## 2022-10-15 DIAGNOSIS — G9341 Metabolic encephalopathy: Secondary | ICD-10-CM | POA: Diagnosis not present

## 2022-10-15 DIAGNOSIS — R4182 Altered mental status, unspecified: Secondary | ICD-10-CM | POA: Diagnosis not present

## 2022-10-15 DIAGNOSIS — J690 Pneumonitis due to inhalation of food and vomit: Secondary | ICD-10-CM | POA: Diagnosis not present

## 2022-10-15 DIAGNOSIS — D72829 Elevated white blood cell count, unspecified: Secondary | ICD-10-CM | POA: Diagnosis not present

## 2022-10-15 DIAGNOSIS — D649 Anemia, unspecified: Secondary | ICD-10-CM | POA: Diagnosis not present

## 2022-10-15 DIAGNOSIS — R442 Other hallucinations: Secondary | ICD-10-CM | POA: Diagnosis not present

## 2022-10-17 DIAGNOSIS — I10 Essential (primary) hypertension: Secondary | ICD-10-CM | POA: Diagnosis not present

## 2022-10-17 DIAGNOSIS — S32414D Nondisplaced fracture of anterior wall of right acetabulum, subsequent encounter for fracture with routine healing: Secondary | ICD-10-CM | POA: Diagnosis not present

## 2022-10-18 DIAGNOSIS — S32591D Other specified fracture of right pubis, subsequent encounter for fracture with routine healing: Secondary | ICD-10-CM | POA: Diagnosis not present

## 2022-10-18 DIAGNOSIS — R2689 Other abnormalities of gait and mobility: Secondary | ICD-10-CM | POA: Diagnosis not present

## 2022-10-18 DIAGNOSIS — M6281 Muscle weakness (generalized): Secondary | ICD-10-CM | POA: Diagnosis not present

## 2022-10-18 DIAGNOSIS — R278 Other lack of coordination: Secondary | ICD-10-CM | POA: Diagnosis not present

## 2022-10-21 DIAGNOSIS — N390 Urinary tract infection, site not specified: Secondary | ICD-10-CM | POA: Diagnosis not present

## 2022-10-21 DIAGNOSIS — M6282 Rhabdomyolysis: Secondary | ICD-10-CM | POA: Diagnosis not present

## 2022-10-21 DIAGNOSIS — J690 Pneumonitis due to inhalation of food and vomit: Secondary | ICD-10-CM | POA: Diagnosis not present

## 2022-10-21 DIAGNOSIS — M6281 Muscle weakness (generalized): Secondary | ICD-10-CM | POA: Diagnosis not present

## 2022-10-21 DIAGNOSIS — I680 Cerebral amyloid angiopathy: Secondary | ICD-10-CM | POA: Diagnosis not present

## 2022-10-21 DIAGNOSIS — N1831 Chronic kidney disease, stage 3a: Secondary | ICD-10-CM | POA: Diagnosis not present

## 2022-10-21 DIAGNOSIS — D649 Anemia, unspecified: Secondary | ICD-10-CM | POA: Diagnosis not present

## 2022-10-21 DIAGNOSIS — G9341 Metabolic encephalopathy: Secondary | ICD-10-CM | POA: Diagnosis not present

## 2022-10-21 DIAGNOSIS — I10 Essential (primary) hypertension: Secondary | ICD-10-CM | POA: Diagnosis not present

## 2022-10-21 DIAGNOSIS — S32401A Unspecified fracture of right acetabulum, initial encounter for closed fracture: Secondary | ICD-10-CM | POA: Diagnosis not present

## 2022-10-21 DIAGNOSIS — S32519A Fracture of superior rim of unspecified pubis, initial encounter for closed fracture: Secondary | ICD-10-CM | POA: Diagnosis not present

## 2022-10-21 DIAGNOSIS — D72829 Elevated white blood cell count, unspecified: Secondary | ICD-10-CM | POA: Diagnosis not present

## 2022-10-22 DIAGNOSIS — D6489 Other specified anemias: Secondary | ICD-10-CM | POA: Diagnosis not present

## 2022-10-22 DIAGNOSIS — F419 Anxiety disorder, unspecified: Secondary | ICD-10-CM | POA: Diagnosis not present

## 2022-10-22 DIAGNOSIS — F4320 Adjustment disorder, unspecified: Secondary | ICD-10-CM | POA: Diagnosis not present

## 2022-10-23 DIAGNOSIS — M6281 Muscle weakness (generalized): Secondary | ICD-10-CM | POA: Diagnosis not present

## 2022-10-23 DIAGNOSIS — G9341 Metabolic encephalopathy: Secondary | ICD-10-CM | POA: Diagnosis not present

## 2022-10-23 DIAGNOSIS — S32519A Fracture of superior rim of unspecified pubis, initial encounter for closed fracture: Secondary | ICD-10-CM | POA: Diagnosis not present

## 2022-10-23 DIAGNOSIS — S3210XD Unspecified fracture of sacrum, subsequent encounter for fracture with routine healing: Secondary | ICD-10-CM | POA: Diagnosis not present

## 2022-10-23 DIAGNOSIS — N1831 Chronic kidney disease, stage 3a: Secondary | ICD-10-CM | POA: Diagnosis not present

## 2022-10-23 DIAGNOSIS — D72829 Elevated white blood cell count, unspecified: Secondary | ICD-10-CM | POA: Diagnosis not present

## 2022-10-23 DIAGNOSIS — S32401A Unspecified fracture of right acetabulum, initial encounter for closed fracture: Secondary | ICD-10-CM | POA: Diagnosis not present

## 2022-10-23 DIAGNOSIS — J690 Pneumonitis due to inhalation of food and vomit: Secondary | ICD-10-CM | POA: Diagnosis not present

## 2022-10-23 DIAGNOSIS — I10 Essential (primary) hypertension: Secondary | ICD-10-CM | POA: Diagnosis not present

## 2022-10-23 DIAGNOSIS — S32591D Other specified fracture of right pubis, subsequent encounter for fracture with routine healing: Secondary | ICD-10-CM | POA: Diagnosis not present

## 2022-10-23 DIAGNOSIS — M6282 Rhabdomyolysis: Secondary | ICD-10-CM | POA: Diagnosis not present

## 2022-10-23 DIAGNOSIS — G4733 Obstructive sleep apnea (adult) (pediatric): Secondary | ICD-10-CM | POA: Diagnosis not present

## 2022-10-23 DIAGNOSIS — D649 Anemia, unspecified: Secondary | ICD-10-CM | POA: Diagnosis not present

## 2022-10-23 DIAGNOSIS — I2510 Atherosclerotic heart disease of native coronary artery without angina pectoris: Secondary | ICD-10-CM | POA: Diagnosis not present

## 2022-10-23 DIAGNOSIS — N183 Chronic kidney disease, stage 3 unspecified: Secondary | ICD-10-CM | POA: Diagnosis not present

## 2022-10-23 DIAGNOSIS — S3289XD Fracture of other parts of pelvis, subsequent encounter for fracture with routine healing: Secondary | ICD-10-CM | POA: Diagnosis not present

## 2022-10-23 DIAGNOSIS — N390 Urinary tract infection, site not specified: Secondary | ICD-10-CM | POA: Diagnosis not present

## 2022-10-23 DIAGNOSIS — I680 Cerebral amyloid angiopathy: Secondary | ICD-10-CM | POA: Diagnosis not present

## 2022-10-24 ENCOUNTER — Telehealth: Payer: Self-pay | Admitting: Family Medicine

## 2022-10-24 NOTE — Telephone Encounter (Addendum)
Noted. Thanks.

## 2022-10-24 NOTE — Telephone Encounter (Signed)
Jesse [name] from [facility] called to report that Patient would like RN ,PT ,OT services delayed  to 10/27/2022 with nursing ,and the other services will follow,instead of beginning on 10/25/2022.

## 2022-10-25 ENCOUNTER — Ambulatory Visit (INDEPENDENT_AMBULATORY_CARE_PROVIDER_SITE_OTHER): Payer: PPO | Admitting: Family Medicine

## 2022-10-25 ENCOUNTER — Encounter: Payer: Self-pay | Admitting: Family Medicine

## 2022-10-25 ENCOUNTER — Telehealth: Payer: Self-pay | Admitting: Family Medicine

## 2022-10-25 VITALS — BP 124/70 | HR 91 | Temp 97.6°F | Ht 69.0 in

## 2022-10-25 DIAGNOSIS — R4182 Altered mental status, unspecified: Secondary | ICD-10-CM

## 2022-10-25 LAB — POC URINALSYSI DIPSTICK (AUTOMATED)
Bilirubin, UA: NEGATIVE
Blood, UA: NEGATIVE
Glucose, UA: NEGATIVE
Leukocytes, UA: NEGATIVE
Nitrite, UA: NEGATIVE
Protein, UA: NEGATIVE
Spec Grav, UA: 1.015 (ref 1.010–1.025)
Urobilinogen, UA: 0.2 E.U./dL
pH, UA: 6 (ref 5.0–8.0)

## 2022-10-25 LAB — CBC WITH DIFFERENTIAL/PLATELET
Basophils Absolute: 50 cells/uL (ref 0–200)
Eosinophils Absolute: 58 cells/uL (ref 15–500)
Hemoglobin: 11.2 g/dL — ABNORMAL LOW (ref 13.2–17.1)
MCHC: 33.6 g/dL (ref 32.0–36.0)
Monocytes Relative: 8.6 %
Neutrophils Relative %: 68.8 %
RBC: 3.41 10*6/uL — ABNORMAL LOW (ref 4.20–5.80)
RDW: 13.6 % (ref 11.0–15.0)
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
## 2022-10-25 NOTE — Telephone Encounter (Signed)
Placed in Lisa's box. 

## 2022-10-25 NOTE — Telephone Encounter (Signed)
FYI: This call has been transferred to Access Nurse. Once the result note has been entered staff can address the message at that time.  Patient called in with the following symptoms:  Red Word: fever,non coherent   Please advise at [facility] [phone]  Message is routed to Provider Pool and [facility] Triage

## 2022-10-25 NOTE — Patient Instructions (Signed)
Urine test looking ok.  Increase water intake by 1-2 glasses a day.  Good to see you today Keep Wednesday appointment.

## 2022-10-25 NOTE — Telephone Encounter (Signed)
Spoke with pt's wife, Kriste Basque (on dpr), notifying her pt's ppw is ready to pick up and there is a $29.00 fee to have it completed. She verbalizes understanding and expresses her thanks.   [Placed ppw at front office. Made copies to scan and for billing.]
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
## 2022-10-26 LAB — CBC WITH DIFFERENTIAL/PLATELET
Absolute Monocytes: 714 cells/uL (ref 200–950)
Basophils Relative: 0.6 %
Eosinophils Relative: 0.7 %
HCT: 33.3 % — ABNORMAL LOW (ref 38.5–50.0)
Lymphs Abs: 1768 cells/uL (ref 850–3900)
MCH: 32.8 pg (ref 27.0–33.0)
MCV: 97.7 fL (ref 80.0–100.0)
MPV: 11 fL (ref 7.5–12.5)
Neutro Abs: 5710 cells/uL (ref 1500–7800)
Platelets: 222 10*3/uL (ref 140–400)
Total Lymphocyte: 21.3 %
WBC: 8.3 10*3/uL (ref 3.8–10.8)

## 2022-10-26 LAB — COMPREHENSIVE METABOLIC PANEL
AG Ratio: 1.2 (calc) (ref 1.0–2.5)
ALT: 11 U/L (ref 9–46)
AST: 14 U/L (ref 10–35)
Albumin: 3.5 g/dL — ABNORMAL LOW (ref 3.6–5.1)
Alkaline phosphatase (APISO): 130 U/L (ref 35–144)
BUN: 16 mg/dL (ref 7–25)
CO2: 20 mmol/L (ref 20–32)
Calcium: 8.8 mg/dL (ref 8.6–10.3)
Chloride: 103 mmol/L (ref 98–110)
Creat: 1.22 mg/dL (ref 0.70–1.28)
Globulin: 2.9 g/dL (calc) (ref 1.9–3.7)
Glucose, Bld: 100 mg/dL — ABNORMAL HIGH (ref 65–99)
Potassium: 3.7 mmol/L (ref 3.5–5.3)
Sodium: 136 mmol/L (ref 135–146)
Total Bilirubin: 1 mg/dL (ref 0.2–1.2)
Total Protein: 6.4 g/dL (ref 6.1–8.1)

## 2022-10-27 DIAGNOSIS — T796XXD Traumatic ischemia of muscle, subsequent encounter: Secondary | ICD-10-CM | POA: Diagnosis not present

## 2022-10-27 DIAGNOSIS — S32591D Other specified fracture of right pubis, subsequent encounter for fracture with routine healing: Secondary | ICD-10-CM | POA: Diagnosis not present

## 2022-10-27 DIAGNOSIS — S32119D Unspecified Zone I fracture of sacrum, subsequent encounter for fracture with routine healing: Secondary | ICD-10-CM | POA: Diagnosis not present

## 2022-10-27 DIAGNOSIS — E785 Hyperlipidemia, unspecified: Secondary | ICD-10-CM | POA: Diagnosis not present

## 2022-10-27 DIAGNOSIS — D631 Anemia in chronic kidney disease: Secondary | ICD-10-CM | POA: Diagnosis not present

## 2022-10-27 DIAGNOSIS — I129 Hypertensive chronic kidney disease with stage 1 through stage 4 chronic kidney disease, or unspecified chronic kidney disease: Secondary | ICD-10-CM | POA: Diagnosis not present

## 2022-10-27 DIAGNOSIS — Z7982 Long term (current) use of aspirin: Secondary | ICD-10-CM | POA: Diagnosis not present

## 2022-10-27 DIAGNOSIS — I700 Atherosclerosis of aorta: Secondary | ICD-10-CM | POA: Diagnosis not present

## 2022-10-27 DIAGNOSIS — I480 Paroxysmal atrial fibrillation: Secondary | ICD-10-CM | POA: Diagnosis not present

## 2022-10-27 DIAGNOSIS — N179 Acute kidney failure, unspecified: Secondary | ICD-10-CM | POA: Diagnosis not present

## 2022-10-27 DIAGNOSIS — S32402D Unspecified fracture of left acetabulum, subsequent encounter for fracture with routine healing: Secondary | ICD-10-CM | POA: Diagnosis not present

## 2022-10-27 DIAGNOSIS — N1831 Chronic kidney disease, stage 3a: Secondary | ICD-10-CM | POA: Diagnosis not present

## 2022-10-27 DIAGNOSIS — M47812 Spondylosis without myelopathy or radiculopathy, cervical region: Secondary | ICD-10-CM | POA: Diagnosis not present

## 2022-10-27 DIAGNOSIS — M4801 Spinal stenosis, occipito-atlanto-axial region: Secondary | ICD-10-CM | POA: Diagnosis not present

## 2022-10-27 DIAGNOSIS — Z9181 History of falling: Secondary | ICD-10-CM | POA: Diagnosis not present

## 2022-10-27 DIAGNOSIS — I2510 Atherosclerotic heart disease of native coronary artery without angina pectoris: Secondary | ICD-10-CM | POA: Diagnosis not present

## 2022-10-27 DIAGNOSIS — Z8701 Personal history of pneumonia (recurrent): Secondary | ICD-10-CM | POA: Diagnosis not present

## 2022-10-27 DIAGNOSIS — S32401D Unspecified fracture of right acetabulum, subsequent encounter for fracture with routine healing: Secondary | ICD-10-CM | POA: Diagnosis not present

## 2022-10-28 ENCOUNTER — Encounter: Payer: Self-pay | Admitting: Family Medicine

## 2022-10-28 NOTE — Assessment & Plan Note (Addendum)
Presents today predominantly to evaluate concern over possible AMS along with urinary frequency and elevated temperature Tmax 99.6 this morning. No other UTI symptoms, overall reassuring exam and UA. Reassurance provided. Encouraged good hydration status. Update labs today. Keep Wed appt for rehab fu visit.

## 2022-10-30 ENCOUNTER — Ambulatory Visit (INDEPENDENT_AMBULATORY_CARE_PROVIDER_SITE_OTHER): Payer: PPO | Admitting: Family Medicine

## 2022-10-30 ENCOUNTER — Encounter: Payer: Self-pay | Admitting: Family Medicine

## 2022-10-30 VITALS — BP 124/78 | HR 93 | Temp 97.6°F | Ht 69.0 in | Wt 171.0 lb

## 2022-10-30 DIAGNOSIS — R4182 Altered mental status, unspecified: Secondary | ICD-10-CM | POA: Diagnosis not present

## 2022-10-30 DIAGNOSIS — Z85820 Personal history of malignant melanoma of skin: Secondary | ICD-10-CM

## 2022-10-30 DIAGNOSIS — R222 Localized swelling, mass and lump, trunk: Secondary | ICD-10-CM | POA: Diagnosis not present

## 2022-10-30 DIAGNOSIS — E854 Organ-limited amyloidosis: Secondary | ICD-10-CM

## 2022-10-30 DIAGNOSIS — S3282XD Multiple fractures of pelvis without disruption of pelvic ring, subsequent encounter for fracture with routine healing: Secondary | ICD-10-CM | POA: Diagnosis not present

## 2022-10-30 DIAGNOSIS — I10 Essential (primary) hypertension: Secondary | ICD-10-CM

## 2022-10-30 DIAGNOSIS — R454 Irritability and anger: Secondary | ICD-10-CM

## 2022-10-30 DIAGNOSIS — I680 Cerebral amyloid angiopathy: Secondary | ICD-10-CM | POA: Diagnosis not present

## 2022-10-30 NOTE — Patient Instructions (Addendum)
For skin mass to right upper back - schedule appointment with Dr Margo Aye.  Drop klonopin dose to night time for 2 weeks then stop.  Continue seroquel for now, we may come off this if sleeping well without.  Return in 3-4 months for physical/wellness visit
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
## 2022-10-31 ENCOUNTER — Telehealth: Payer: Self-pay | Admitting: Family Medicine

## 2022-10-31 DIAGNOSIS — R222 Localized swelling, mass and lump, trunk: Secondary | ICD-10-CM | POA: Insufficient documentation

## 2022-10-31 MED ORDER — NITROGLYCERIN 0.4 MG SL SUBL: 0.4000 mg | SUBLINGUAL_TABLET | SUBLINGUAL | 1 refills | Status: DC | PRN

## 2022-10-31 NOTE — Assessment & Plan Note (Signed)
Chronic, stable. Carvedilol dose decreased during hospital. Also noted 20 lb weight loss during recent hospitalization.

## 2022-10-31 NOTE — Assessment & Plan Note (Addendum)
S/p hospitalization followed by [facility] rehab stay, came home 10/23/2022, now [facility] PT/OT/SN involved.  He also has caregiver to help.  Was started on klonopin, seroquel - will slowly taper off Klonopin and then possibly Seroquel pending response.  RTC 3 mo f/u visit

## 2022-10-31 NOTE — Assessment & Plan Note (Signed)
Longstanding h/o this - consider SSRI.

## 2022-10-31 NOTE — Assessment & Plan Note (Addendum)
H/o this, stable on latest MRI 09/2022 (chronic micro-hemorrhages peripherally) followed by neurology ([place])

## 2022-10-31 NOTE — Assessment & Plan Note (Signed)
Ongoing evidence of fluctuating cognition with impaired judgement ie going up stairs alone, forgetting how to use phone.  Concern for worsening progression of cognitive impairment in h/o cerebral amyloid angiopathy however recent MRI was reassuring.  Will taper off klonopin now he's home, consider eventual taper off seroquel - currently on 25mg  nightly.  Encouraged try to schedule sooner neurology appt.

## 2022-10-31 NOTE — Assessment & Plan Note (Signed)
Anticipate lipoma, r/o epidermal cyst although no pore appreciated. Mildly tender to palpation but not acutely infected/inflamed.  He notes it is bothersome - will refer to derm for evaluation.

## 2022-10-31 NOTE — Assessment & Plan Note (Signed)
Encouraged yearly derm f/u.  ?

## 2022-10-31 NOTE — Telephone Encounter (Signed)
Lawson Fiscal from [facility] called to report that the patient does not have nitroglycerin in his home,and he is not taking Bisacodyl,and he is taking magnesium citrate.

## 2022-10-31 NOTE — Telephone Encounter (Signed)
Lvm asking pt to call back.  Need to relay Dr. G's message and get answer to his question.  

## 2022-10-31 NOTE — Addendum Note (Signed)
Addended by: Eustaquio Boyden on: 10/31/2022 12:09 PM   Modules accepted: Orders

## 2022-10-31 NOTE — Addendum Note (Signed)
Addended by: Eustaquio Boyden on: 10/31/2022 10:44 AM   Modules accepted: Level of Service

## 2022-10-31 NOTE — Telephone Encounter (Signed)
Noted. SL nitroglycerin refilled.  What dose of mag citrate is he taking? I think it's 1 tablet daily.

## 2022-11-01 ENCOUNTER — Encounter: Payer: Self-pay | Admitting: *Deleted

## 2022-11-01 NOTE — Telephone Encounter (Signed)
Lvm asking pt to call back.  Need to relay Dr. G's message and get answer to his question.  

## 2022-11-04 DIAGNOSIS — M47812 Spondylosis without myelopathy or radiculopathy, cervical region: Secondary | ICD-10-CM | POA: Diagnosis not present

## 2022-11-04 DIAGNOSIS — I129 Hypertensive chronic kidney disease with stage 1 through stage 4 chronic kidney disease, or unspecified chronic kidney disease: Secondary | ICD-10-CM | POA: Diagnosis not present

## 2022-11-04 DIAGNOSIS — M4801 Spinal stenosis, occipito-atlanto-axial region: Secondary | ICD-10-CM

## 2022-11-04 DIAGNOSIS — N179 Acute kidney failure, unspecified: Secondary | ICD-10-CM

## 2022-11-04 DIAGNOSIS — S32402D Unspecified fracture of left acetabulum, subsequent encounter for fracture with routine healing: Secondary | ICD-10-CM | POA: Diagnosis not present

## 2022-11-04 DIAGNOSIS — T796XXD Traumatic ischemia of muscle, subsequent encounter: Secondary | ICD-10-CM | POA: Diagnosis not present

## 2022-11-04 DIAGNOSIS — S32401D Unspecified fracture of right acetabulum, subsequent encounter for fracture with routine healing: Secondary | ICD-10-CM | POA: Diagnosis not present

## 2022-11-04 DIAGNOSIS — E785 Hyperlipidemia, unspecified: Secondary | ICD-10-CM

## 2022-11-04 DIAGNOSIS — S32591D Other specified fracture of right pubis, subsequent encounter for fracture with routine healing: Secondary | ICD-10-CM | POA: Diagnosis not present

## 2022-11-04 DIAGNOSIS — D631 Anemia in chronic kidney disease: Secondary | ICD-10-CM | POA: Diagnosis not present

## 2022-11-04 DIAGNOSIS — Z8701 Personal history of pneumonia (recurrent): Secondary | ICD-10-CM

## 2022-11-04 DIAGNOSIS — Z7982 Long term (current) use of aspirin: Secondary | ICD-10-CM

## 2022-11-04 DIAGNOSIS — Z9181 History of falling: Secondary | ICD-10-CM

## 2022-11-04 DIAGNOSIS — S32119D Unspecified Zone I fracture of sacrum, subsequent encounter for fracture with routine healing: Secondary | ICD-10-CM | POA: Diagnosis not present

## 2022-11-04 DIAGNOSIS — N1831 Chronic kidney disease, stage 3a: Secondary | ICD-10-CM | POA: Diagnosis not present

## 2022-11-04 DIAGNOSIS — I480 Paroxysmal atrial fibrillation: Secondary | ICD-10-CM | POA: Diagnosis not present

## 2022-11-04 DIAGNOSIS — I2510 Atherosclerotic heart disease of native coronary artery without angina pectoris: Secondary | ICD-10-CM | POA: Diagnosis not present

## 2022-11-04 DIAGNOSIS — I700 Atherosclerosis of aorta: Secondary | ICD-10-CM | POA: Diagnosis not present

## 2022-11-04 NOTE — Telephone Encounter (Signed)
Pt's wife called back returning Lisa's call. Told pt's wife Dr. Timoteo Expose response. Pt's wife stated the pt takes 500 mg of mag citrate. Call back # [phone]

## 2022-11-04 NOTE — Addendum Note (Signed)
Addended by: Nanci Pina on: 11/04/2022 10:31 AM   Modules accepted: Orders

## 2022-11-04 NOTE — Telephone Encounter (Signed)
Lvm asking pt to call back.  Need to relay Dr. G's message and get answer to his question.  Mailing a letter.  

## 2022-11-04 NOTE — Telephone Encounter (Signed)
Updated pt's med list.   Fyi to Dr. Reece Agar.

## 2022-11-06 ENCOUNTER — Telehealth: Payer: Self-pay | Admitting: Family Medicine

## 2022-11-06 DIAGNOSIS — L02212 Cutaneous abscess of back [any part, except buttock]: Secondary | ICD-10-CM | POA: Diagnosis not present

## 2022-11-06 DIAGNOSIS — Z6824 Body mass index (BMI) 24.0-24.9, adult: Secondary | ICD-10-CM | POA: Diagnosis not present

## 2022-11-06 NOTE — Telephone Encounter (Signed)
Replied via wife's mychart Looks like inflamed cyst - needs to have material expressed > antibiotic.  She will take him to [facility] today.
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
## 2022-11-11 ENCOUNTER — Ambulatory Visit: Payer: PPO | Admitting: Cardiology

## 2022-11-12 ENCOUNTER — Encounter: Payer: Self-pay | Admitting: Gastroenterology

## 2022-11-16 ENCOUNTER — Encounter: Payer: Self-pay | Admitting: Family Medicine

## 2022-11-22 DIAGNOSIS — N183 Chronic kidney disease, stage 3 unspecified: Secondary | ICD-10-CM | POA: Diagnosis not present

## 2022-11-22 DIAGNOSIS — S3210XD Unspecified fracture of sacrum, subsequent encounter for fracture with routine healing: Secondary | ICD-10-CM | POA: Diagnosis not present

## 2022-11-22 DIAGNOSIS — S3289XD Fracture of other parts of pelvis, subsequent encounter for fracture with routine healing: Secondary | ICD-10-CM | POA: Diagnosis not present

## 2022-11-22 DIAGNOSIS — G4733 Obstructive sleep apnea (adult) (pediatric): Secondary | ICD-10-CM | POA: Diagnosis not present

## 2022-11-22 DIAGNOSIS — S32591D Other specified fracture of right pubis, subsequent encounter for fracture with routine healing: Secondary | ICD-10-CM | POA: Diagnosis not present

## 2022-11-22 DIAGNOSIS — I2510 Atherosclerotic heart disease of native coronary artery without angina pectoris: Secondary | ICD-10-CM | POA: Diagnosis not present

## 2022-11-25 MED ORDER — BUPROPION HCL ER (SR) 100 MG PO TB12
100.0000 mg | ORAL_TABLET | Freq: Every day | ORAL | 3 refills | Status: DC
Start: 2022-11-25 — End: 2023-05-01

## 2022-11-25 NOTE — Telephone Encounter (Signed)
Patient's wife's called in again today regarding this message.She stated that she still haven't heard back from anyone and her husband really needs ant antidepressant.

## 2022-11-27 DIAGNOSIS — D225 Melanocytic nevi of trunk: Secondary | ICD-10-CM | POA: Diagnosis not present

## 2022-11-27 DIAGNOSIS — L720 Epidermal cyst: Secondary | ICD-10-CM | POA: Diagnosis not present

## 2022-11-27 DIAGNOSIS — Z08 Encounter for follow-up examination after completed treatment for malignant neoplasm: Secondary | ICD-10-CM | POA: Diagnosis not present

## 2022-11-27 DIAGNOSIS — X32XXXA Exposure to sunlight, initial encounter: Secondary | ICD-10-CM | POA: Diagnosis not present

## 2022-11-27 DIAGNOSIS — Z1283 Encounter for screening for malignant neoplasm of skin: Secondary | ICD-10-CM | POA: Diagnosis not present

## 2022-11-27 DIAGNOSIS — L570 Actinic keratosis: Secondary | ICD-10-CM | POA: Diagnosis not present

## 2022-11-27 DIAGNOSIS — C44519 Basal cell carcinoma of skin of other part of trunk: Secondary | ICD-10-CM | POA: Diagnosis not present

## 2022-11-27 DIAGNOSIS — Z85820 Personal history of malignant melanoma of skin: Secondary | ICD-10-CM | POA: Diagnosis not present

## 2022-11-28 NOTE — Addendum Note (Signed)
Addended by: Eustaquio Boyden on: 11/28/2022 10:21 AM   Modules accepted: Orders

## 2022-12-07 ENCOUNTER — Other Ambulatory Visit: Payer: Self-pay | Admitting: Family Medicine

## 2022-12-18 ENCOUNTER — Telehealth: Payer: Self-pay

## 2022-12-18 MED ORDER — CARVEDILOL 3.125 MG PO TABS: 3.1250 mg | ORAL_TABLET | Freq: Two times a day (BID) | ORAL | 1 refills | Status: DC
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
## 2022-12-18 NOTE — Telephone Encounter (Signed)
I don't see where we received carvedilol refill request. He should continue 3.125mg  BID - new dose sent to pharmacy.

## 2022-12-18 NOTE — Addendum Note (Signed)
Addended by: Eustaquio Boyden on: 12/18/2022 03:10 PM   Modules accepted: Orders

## 2022-12-23 DIAGNOSIS — Z08 Encounter for follow-up examination after completed treatment for malignant neoplasm: Secondary | ICD-10-CM | POA: Diagnosis not present

## 2022-12-23 DIAGNOSIS — G4733 Obstructive sleep apnea (adult) (pediatric): Secondary | ICD-10-CM | POA: Diagnosis not present

## 2022-12-23 DIAGNOSIS — S3289XD Fracture of other parts of pelvis, subsequent encounter for fracture with routine healing: Secondary | ICD-10-CM | POA: Diagnosis not present

## 2022-12-23 DIAGNOSIS — L720 Epidermal cyst: Secondary | ICD-10-CM | POA: Diagnosis not present

## 2022-12-23 DIAGNOSIS — I2510 Atherosclerotic heart disease of native coronary artery without angina pectoris: Secondary | ICD-10-CM | POA: Diagnosis not present

## 2022-12-23 DIAGNOSIS — S32591D Other specified fracture of right pubis, subsequent encounter for fracture with routine healing: Secondary | ICD-10-CM | POA: Diagnosis not present

## 2022-12-23 DIAGNOSIS — Z85828 Personal history of other malignant neoplasm of skin: Secondary | ICD-10-CM | POA: Diagnosis not present

## 2022-12-23 DIAGNOSIS — N183 Chronic kidney disease, stage 3 unspecified: Secondary | ICD-10-CM | POA: Diagnosis not present

## 2022-12-23 DIAGNOSIS — S3210XD Unspecified fracture of sacrum, subsequent encounter for fracture with routine healing: Secondary | ICD-10-CM | POA: Diagnosis not present

## 2022-12-24 DIAGNOSIS — M25559 Pain in unspecified hip: Secondary | ICD-10-CM | POA: Diagnosis not present

## 2023-01-02 DIAGNOSIS — R2681 Unsteadiness on feet: Secondary | ICD-10-CM | POA: Diagnosis not present

## 2023-01-02 DIAGNOSIS — M25559 Pain in unspecified hip: Secondary | ICD-10-CM | POA: Diagnosis not present

## 2023-01-02 DIAGNOSIS — R102 Pelvic and perineal pain: Secondary | ICD-10-CM | POA: Diagnosis not present

## 2023-01-06 ENCOUNTER — Institutional Professional Consult (permissible substitution): Payer: PPO | Admitting: Diagnostic Neuroimaging

## 2023-01-06 ENCOUNTER — Encounter: Payer: Self-pay | Admitting: Diagnostic Neuroimaging

## 2023-01-07 ENCOUNTER — Ambulatory Visit (INDEPENDENT_AMBULATORY_CARE_PROVIDER_SITE_OTHER): Payer: PPO | Admitting: Family Medicine

## 2023-01-07 ENCOUNTER — Encounter: Payer: Self-pay | Admitting: Family Medicine

## 2023-01-07 ENCOUNTER — Ambulatory Visit: Admission: RE | Admit: 2023-01-07 | Payer: PPO | Source: Ambulatory Visit

## 2023-01-07 VITALS — BP 146/98 | HR 96 | Temp 98.9°F | Ht 69.0 in | Wt 162.0 lb

## 2023-01-07 DIAGNOSIS — R634 Abnormal weight loss: Secondary | ICD-10-CM | POA: Insufficient documentation

## 2023-01-07 DIAGNOSIS — S3282XD Multiple fractures of pelvis without disruption of pelvic ring, subsequent encounter for fracture with routine healing: Secondary | ICD-10-CM

## 2023-01-07 DIAGNOSIS — Z9889 Other specified postprocedural states: Secondary | ICD-10-CM

## 2023-01-07 DIAGNOSIS — I680 Cerebral amyloid angiopathy: Secondary | ICD-10-CM | POA: Diagnosis not present

## 2023-01-07 DIAGNOSIS — E278 Other specified disorders of adrenal gland: Secondary | ICD-10-CM

## 2023-01-07 DIAGNOSIS — R454 Irritability and anger: Secondary | ICD-10-CM | POA: Diagnosis not present

## 2023-01-07 DIAGNOSIS — I878 Other specified disorders of veins: Secondary | ICD-10-CM | POA: Diagnosis not present

## 2023-01-07 DIAGNOSIS — K5900 Constipation, unspecified: Secondary | ICD-10-CM | POA: Diagnosis not present

## 2023-01-07 DIAGNOSIS — I10 Essential (primary) hypertension: Secondary | ICD-10-CM

## 2023-01-07 DIAGNOSIS — N183 Chronic kidney disease, stage 3 unspecified: Secondary | ICD-10-CM | POA: Diagnosis not present

## 2023-01-07 DIAGNOSIS — M25559 Pain in unspecified hip: Secondary | ICD-10-CM | POA: Diagnosis not present

## 2023-01-07 DIAGNOSIS — R2681 Unsteadiness on feet: Secondary | ICD-10-CM | POA: Diagnosis not present

## 2023-01-07 DIAGNOSIS — E854 Organ-limited amyloidosis: Secondary | ICD-10-CM

## 2023-01-07 DIAGNOSIS — R102 Pelvic and perineal pain: Secondary | ICD-10-CM | POA: Diagnosis not present

## 2023-01-07 MED ORDER — CARVEDILOL 6.25 MG PO TABS: 6.2500 mg | ORAL_TABLET | Freq: Two times a day (BID) | ORAL | 1 refills | Status: DC
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
## 2023-01-07 NOTE — Patient Instructions (Addendum)
Labs today. Pick up stool test at lab.  Xray today  Increase miralax to 1 capful twice daily for the next 3 days, add senna tablet over the counter daily as needed for constipation.  If this doesn't help let me know - we may need to change from wellbutrin to different medicine like lexapro.  Start boost or ensure or premiere protein supplement once daily.  Increase carvedilol to 6.25mg  twice daily.  Keep appointment in October

## 2023-01-08 DIAGNOSIS — E278 Other specified disorders of adrenal gland: Secondary | ICD-10-CM | POA: Insufficient documentation

## 2023-01-08 LAB — CBC WITH DIFFERENTIAL/PLATELET
Basophils Absolute: 0 10*3/uL (ref 0.0–0.1)
Basophils Relative: 0.6 % (ref 0.0–3.0)
Eosinophils Absolute: 0 10*3/uL (ref 0.0–0.7)
Eosinophils Relative: 0.5 % (ref 0.0–5.0)
HCT: 37.4 % — ABNORMAL LOW (ref 39.0–52.0)
Hemoglobin: 12.5 g/dL — ABNORMAL LOW (ref 13.0–17.0)
Lymphocytes Relative: 27.1 % (ref 12.0–46.0)
Lymphs Abs: 1.7 10*3/uL (ref 0.7–4.0)
MCHC: 33.5 g/dL (ref 30.0–36.0)
MCV: 96.4 fl (ref 78.0–100.0)
Monocytes Absolute: 0.3 10*3/uL (ref 0.1–1.0)
Monocytes Relative: 5.4 % (ref 3.0–12.0)
Neutro Abs: 4.2 10*3/uL (ref 1.4–7.7)
Neutrophils Relative %: 66.4 % (ref 43.0–77.0)
Platelets: 225 10*3/uL (ref 150.0–400.0)
RBC: 3.88 Mil/uL — ABNORMAL LOW (ref 4.22–5.81)
RDW: 16 % — ABNORMAL HIGH (ref 11.5–15.5)
WBC: 6.4 10*3/uL (ref 4.0–10.5)

## 2023-01-08 LAB — COMPREHENSIVE METABOLIC PANEL
ALT: 11 U/L (ref 0–53)
AST: 15 U/L (ref 0–37)
Albumin: 3.7 g/dL (ref 3.5–5.2)
Alkaline Phosphatase: 79 U/L (ref 39–117)
BUN: 17 mg/dL (ref 6–23)
CO2: 25 meq/L (ref 19–32)
Calcium: 9.4 mg/dL (ref 8.4–10.5)
Chloride: 104 meq/L (ref 96–112)
Creatinine, Ser: 1.41 mg/dL (ref 0.40–1.50)
GFR: 48.13 mL/min — ABNORMAL LOW (ref >60.00)
Glucose, Bld: 103 mg/dL — ABNORMAL HIGH (ref 70–99)
Potassium: 4 meq/L (ref 3.5–5.1)
Sodium: 138 meq/L (ref 135–145)
Total Bilirubin: 0.7 mg/dL (ref 0.2–1.2)
Total Protein: 6.3 g/dL (ref 6.0–8.3)

## 2023-01-08 LAB — TSH: TSH: 1.15 u[IU]/mL (ref 0.35–5.50)

## 2023-01-08 NOTE — Assessment & Plan Note (Signed)
Overall improvement on wellbutrin. Previous intolerance to paxil, sertraline (diarrhea).  Consider change in mood medication as per above.
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
## 2023-01-08 NOTE — Assessment & Plan Note (Signed)
Continues outpatient PT with benefit.

## 2023-01-08 NOTE — Assessment & Plan Note (Signed)
Discussed possible repeat imaging of adrenal nodule to document stability. Await labs and abd xray.

## 2023-01-08 NOTE — Assessment & Plan Note (Signed)
Chronic, above goal. Increase carvedilol back to 6.25mg  bid.

## 2023-01-08 NOTE — Assessment & Plan Note (Addendum)
Ongoing for 2-3 wks, no significant concern for obstruction. Check 2 view abd - no obvious obstructive bowel gas pattern.  Already trying OTC miralax, metamucil, magnesium supplement without benefit. Discussed increasing miralax to BID PRN constipation, add senna OTC daily for 3 days.  Check labs today as per below.  Possible medication side effect - most recent new med is wellbutrin which can have side effect of constipation - if above unrevealing, consider change in mood medication.

## 2023-01-09 ENCOUNTER — Other Ambulatory Visit (INDEPENDENT_AMBULATORY_CARE_PROVIDER_SITE_OTHER): Payer: PPO | Admitting: Radiology

## 2023-01-09 DIAGNOSIS — K5900 Constipation, unspecified: Secondary | ICD-10-CM

## 2023-01-09 DIAGNOSIS — R2681 Unsteadiness on feet: Secondary | ICD-10-CM | POA: Diagnosis not present

## 2023-01-09 DIAGNOSIS — M25559 Pain in unspecified hip: Secondary | ICD-10-CM | POA: Diagnosis not present

## 2023-01-09 DIAGNOSIS — R102 Pelvic and perineal pain: Secondary | ICD-10-CM | POA: Diagnosis not present

## 2023-01-10 ENCOUNTER — Telehealth: Payer: Self-pay

## 2023-01-10 DIAGNOSIS — R634 Abnormal weight loss: Secondary | ICD-10-CM

## 2023-01-10 DIAGNOSIS — K5900 Constipation, unspecified: Secondary | ICD-10-CM

## 2023-01-10 DIAGNOSIS — R195 Other fecal abnormalities: Secondary | ICD-10-CM

## 2023-01-10 LAB — FECAL OCCULT BLOOD, IMMUNOCHEMICAL: Fecal Occult Bld: POSITIVE — AB

## 2023-01-10 NOTE — Telephone Encounter (Signed)
Please notify pt - stool test was positive for hidden blood. For this reason along with recent weight loss, I recommend return to GI to discuss repeat colonoscopy. Referral placed. They may call [facility] to schedule an appointment at [phone].

## 2023-01-10 NOTE — Telephone Encounter (Signed)
CRITICAL VALUE STICKER  CRITICAL VALUE: + ifob  RECEIVER (on-site recipient of call): Morrie Sheldon  DATE & TIME NOTIFIED: 01/10/23 9:43a  MESSENGER (representative from lab): Leah  MD NOTIFIED: Dr. Para March due to Dr. Sharen Hones being out of the office today.   TIME OF NOTIFICATION:9:47a  RESPONSE:  Plan per provider.

## 2023-01-10 NOTE — Telephone Encounter (Signed)
Lvm asking pt to call back.  Need to relay Dr. G's message.  

## 2023-01-10 NOTE — Telephone Encounter (Signed)
I thank all involved.  Dr. Reece Agar addressed this before I could.

## 2023-01-14 DIAGNOSIS — R102 Pelvic and perineal pain: Secondary | ICD-10-CM | POA: Diagnosis not present

## 2023-01-14 DIAGNOSIS — R2681 Unsteadiness on feet: Secondary | ICD-10-CM | POA: Diagnosis not present

## 2023-01-14 DIAGNOSIS — M25559 Pain in unspecified hip: Secondary | ICD-10-CM | POA: Diagnosis not present

## 2023-01-14 NOTE — Telephone Encounter (Signed)
Lvm asking pt to call back.  Need to relay Dr. G's message.  

## 2023-01-15 NOTE — Telephone Encounter (Signed)
Lvm asking pt to call back.  Need to relay Dr. G's message.  

## 2023-01-16 DIAGNOSIS — M25559 Pain in unspecified hip: Secondary | ICD-10-CM | POA: Diagnosis not present

## 2023-01-16 DIAGNOSIS — R102 Pelvic and perineal pain: Secondary | ICD-10-CM | POA: Diagnosis not present

## 2023-01-16 DIAGNOSIS — R2681 Unsteadiness on feet: Secondary | ICD-10-CM | POA: Diagnosis not present

## 2023-01-16 NOTE — Telephone Encounter (Signed)
Lvm asking pt to call back. Need to relay Dr Timoteo Expose message.   Also, mailing a letter due to no response by phone.

## 2023-01-22 ENCOUNTER — Encounter: Payer: Self-pay | Admitting: Family Medicine

## 2023-01-22 ENCOUNTER — Encounter: Payer: Self-pay | Admitting: Physician Assistant

## 2023-01-22 DIAGNOSIS — R195 Other fecal abnormalities: Secondary | ICD-10-CM

## 2023-01-22 DIAGNOSIS — R634 Abnormal weight loss: Secondary | ICD-10-CM

## 2023-01-22 DIAGNOSIS — K5900 Constipation, unspecified: Secondary | ICD-10-CM

## 2023-01-23 DIAGNOSIS — S3289XD Fracture of other parts of pelvis, subsequent encounter for fracture with routine healing: Secondary | ICD-10-CM | POA: Diagnosis not present

## 2023-01-23 DIAGNOSIS — N183 Chronic kidney disease, stage 3 unspecified: Secondary | ICD-10-CM | POA: Diagnosis not present

## 2023-01-23 DIAGNOSIS — R102 Pelvic and perineal pain: Secondary | ICD-10-CM | POA: Diagnosis not present

## 2023-01-23 DIAGNOSIS — G4733 Obstructive sleep apnea (adult) (pediatric): Secondary | ICD-10-CM | POA: Diagnosis not present

## 2023-01-23 DIAGNOSIS — R2681 Unsteadiness on feet: Secondary | ICD-10-CM | POA: Diagnosis not present

## 2023-01-23 DIAGNOSIS — S3210XD Unspecified fracture of sacrum, subsequent encounter for fracture with routine healing: Secondary | ICD-10-CM | POA: Diagnosis not present

## 2023-01-23 DIAGNOSIS — I2510 Atherosclerotic heart disease of native coronary artery without angina pectoris: Secondary | ICD-10-CM | POA: Diagnosis not present

## 2023-01-23 DIAGNOSIS — S32591D Other specified fracture of right pubis, subsequent encounter for fracture with routine healing: Secondary | ICD-10-CM | POA: Diagnosis not present

## 2023-01-23 DIAGNOSIS — M25559 Pain in unspecified hip: Secondary | ICD-10-CM | POA: Diagnosis not present

## 2023-01-28 NOTE — Telephone Encounter (Signed)
New referral placed to [facility].

## 2023-01-28 NOTE — Telephone Encounter (Signed)
Anne at [facility] is working the referral to [facility]- DR. Lionel December (located at [facility]); so I closed this referral to [facility] as a duplicate. I spoke to wife Kriste Basque and she is calling now to schedule the appt for this week. Thank you!

## 2023-01-29 ENCOUNTER — Encounter (INDEPENDENT_AMBULATORY_CARE_PROVIDER_SITE_OTHER): Payer: Self-pay | Admitting: Gastroenterology

## 2023-01-29 ENCOUNTER — Ambulatory Visit (INDEPENDENT_AMBULATORY_CARE_PROVIDER_SITE_OTHER): Payer: PPO | Admitting: Gastroenterology

## 2023-01-29 ENCOUNTER — Encounter (INDEPENDENT_AMBULATORY_CARE_PROVIDER_SITE_OTHER): Payer: Self-pay

## 2023-01-29 VITALS — BP 143/77 | HR 70 | Temp 97.5°F | Ht 69.0 in | Wt 166.9 lb

## 2023-01-29 DIAGNOSIS — R195 Other fecal abnormalities: Secondary | ICD-10-CM | POA: Insufficient documentation

## 2023-01-29 DIAGNOSIS — R634 Abnormal weight loss: Secondary | ICD-10-CM | POA: Diagnosis not present

## 2023-01-29 DIAGNOSIS — Z87891 Personal history of nicotine dependence: Secondary | ICD-10-CM

## 2023-01-29 DIAGNOSIS — I10 Essential (primary) hypertension: Secondary | ICD-10-CM | POA: Diagnosis not present

## 2023-01-29 DIAGNOSIS — K921 Melena: Secondary | ICD-10-CM

## 2023-01-29 DIAGNOSIS — K5904 Chronic idiopathic constipation: Secondary | ICD-10-CM

## 2023-01-29 DIAGNOSIS — K5909 Other constipation: Secondary | ICD-10-CM

## 2023-01-29 MED ORDER — POLYETHYLENE GLYCOL 3350 17 G PO PACK
17.0000 g | PACK | Freq: Two times a day (BID) | ORAL | 0 refills | Status: AC
Start: 2023-01-29 — End: 2023-04-29

## 2023-01-29 MED ORDER — PEG 3350-KCL-NA BICARB-NACL 420 G PO SOLR: 4000.0000 mL | Freq: Once | ORAL | 0 refills | Status: AC

## 2023-01-29 MED ORDER — PSYLLIUM 58.6 % PO PACK
1.0000 | PACK | Freq: Two times a day (BID) | ORAL | 2 refills | Status: AC
Start: 2023-01-29 — End: 2023-04-29

## 2023-01-29 NOTE — Patient Instructions (Signed)
It was very nice to meet you today, as dicussed with will plan for the following :  1) CT abdomen and pelvis  2) EGD and colonoscopy  3) Ensure adequate fluid intake: Aim for 8 glasses of water daily. Follow a high fiber diet: Include foods such as dates, prunes, pears, and kiwi. Take Miralax twice a day for the first week, then reduce to once daily thereafter. Use Metamucil twice a day.
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
## 2023-01-30 ENCOUNTER — Telehealth (INDEPENDENT_AMBULATORY_CARE_PROVIDER_SITE_OTHER): Payer: Self-pay | Admitting: Gastroenterology

## 2023-01-30 DIAGNOSIS — R102 Pelvic and perineal pain: Secondary | ICD-10-CM | POA: Diagnosis not present

## 2023-01-30 DIAGNOSIS — M25559 Pain in unspecified hip: Secondary | ICD-10-CM | POA: Diagnosis not present

## 2023-01-30 DIAGNOSIS — R2681 Unsteadiness on feet: Secondary | ICD-10-CM | POA: Diagnosis not present

## 2023-01-30 NOTE — Telephone Encounter (Signed)
Left detailed message on wife cell phone regarding pre op appt information

## 2023-02-03 ENCOUNTER — Encounter: Payer: Self-pay | Admitting: Family Medicine

## 2023-02-03 DIAGNOSIS — R2689 Other abnormalities of gait and mobility: Secondary | ICD-10-CM

## 2023-02-03 DIAGNOSIS — R413 Other amnesia: Secondary | ICD-10-CM

## 2023-02-03 DIAGNOSIS — E854 Organ-limited amyloidosis: Secondary | ICD-10-CM

## 2023-02-03 DIAGNOSIS — R4182 Altered mental status, unspecified: Secondary | ICD-10-CM

## 2023-02-04 DIAGNOSIS — M25559 Pain in unspecified hip: Secondary | ICD-10-CM | POA: Diagnosis not present

## 2023-02-04 DIAGNOSIS — R2681 Unsteadiness on feet: Secondary | ICD-10-CM | POA: Diagnosis not present

## 2023-02-04 DIAGNOSIS — R102 Pelvic and perineal pain: Secondary | ICD-10-CM | POA: Diagnosis not present

## 2023-02-05 NOTE — Telephone Encounter (Signed)
Neurology referral placed to [facility]

## 2023-02-06 DIAGNOSIS — M25559 Pain in unspecified hip: Secondary | ICD-10-CM | POA: Diagnosis not present

## 2023-02-06 DIAGNOSIS — R102 Pelvic and perineal pain: Secondary | ICD-10-CM | POA: Diagnosis not present

## 2023-02-06 DIAGNOSIS — R2681 Unsteadiness on feet: Secondary | ICD-10-CM | POA: Diagnosis not present

## 2023-02-11 ENCOUNTER — Encounter (HOSPITAL_COMMUNITY)
Admission: RE | Admit: 2023-02-11 | Discharge: 2023-02-11 | Disposition: A | Discharge status: Home / Self Care | Payer: PPO | Source: Ambulatory Visit | Attending: Gastroenterology | Admitting: Gastroenterology

## 2023-02-11 HISTORY — PX: ESOPHAGOGASTRODUODENOSCOPY: SHX1529

## 2023-02-11 HISTORY — PX: COLONOSCOPY: SHX174

## 2023-02-11 NOTE — Patient Instructions (Signed)
Your procedure is scheduled on: 02/14/2023  Report to [facility] Main Entrance at  6:30   AM.  Call this number if you have problems the morning of surgery: [phone]   Remember:              Follow Directions on the letter you received from Your Physician's office regarding the Bowel Prep              No Smoking the day of Procedure :   Take these medicines the morning of surgery with A SIP OF WATER: Wellbutrin and flomax   Do not wear jewelry, make-up or nail polish.    Do not bring valuables to the hospital.  Contacts, dentures or bridgework may not be worn into surgery.  .   Patients discharged the day of surgery will not be allowed to drive home.     Colonoscopy, Adult, Care After This sheet gives you information about how to care for yourself after your procedure. Your health care provider may also give you more specific instructions. If you have problems or questions, contact your health care provider. What can I expect after the procedure? After the procedure, it is common to have: A small amount of blood in your stool for 24 hours after the procedure. Some gas. Mild abdominal cramping or bloating.  Follow these instructions at home: General instructions  For the first 24 hours after the procedure: Do not drive or use machinery. Do not sign important documents. Do not drink alcohol. Do your regular daily activities at a slower pace than normal. Eat soft, easy-to-digest foods. Rest often. Take over-the-counter or prescription medicines only as told by your health care provider. It is up to you to get the results of your procedure. Ask your health care provider, or the department performing the procedure, when your results will be ready. Relieving cramping and bloating Try walking around when you have cramps or feel bloated. Apply heat to your abdomen as told by your health care provider. Use a heat source that your health care provider recommends, such as a  moist heat pack or a heating pad. Place a towel between your skin and the heat source. Leave the heat on for 20-30 minutes. Remove the heat if your skin turns bright red. This is especially important if you are unable to feel pain, heat, or cold. You may have a greater risk of getting burned. Eating and drinking Drink enough fluid to keep your urine clear or pale yellow. Resume your normal diet as instructed by your health care provider. Avoid heavy or fried foods that are hard to digest. Avoid drinking alcohol for as long as instructed by your health care provider. Contact a health care provider if: You have blood in your stool 2-3 days after the procedure. Get help right away if: You have more than a small spotting of blood in your stool. You pass large blood clots in your stool. Your abdomen is swollen. You have nausea or vomiting. You have a fever. You have increasing abdominal pain that is not relieved with medicine. This information is not intended to replace advice given to you by your health care provider. Make sure you discuss any questions you have with your health care provider. Document Released: 12/12/2003 Document Revised: 01/22/2016 Document Reviewed: 07/11/2015 Elsevier Interactive Patient Education  2018 [employer] Upper Endoscopy, Adult, Care After After the procedure, it is common to have a sore throat. It is also common to have: Mild stomach  pain or discomfort. Bloating. Nausea. Follow these instructions at home: The instructions below may help you care for yourself at home. Your health care provider may give you more instructions. If you have questions, ask your health care provider. If you were given a sedative during the procedure, it can affect you for several hours. Do not drive or operate machinery until your health care provider says that it is safe. If you will be going home right after the procedure, plan to have a responsible adult: Take you home from  the hospital or clinic. You will not be allowed to drive. Care for you for the time you are told. Follow instructions from your health care provider about what you may eat and drink. Return to your normal activities as told by your health care provider. Ask your health care provider what activities are safe for you. Take over-the-counter and prescription medicines only as told by your health care provider. Contact a health care provider if you: Have a sore throat that lasts longer than one day. Have trouble swallowing. Have a fever. Get help right away if you: Vomit blood or your vomit looks like coffee grounds. Have bloody, black, or tarry stools. Have a very bad sore throat or you cannot swallow. Have difficulty breathing or very bad pain in your chest or abdomen. These symptoms may be an emergency. Get help right away. Call 911. Do not wait to see if the symptoms will go away. Do not drive yourself to the hospital. Summary After the procedure, it is common to have a sore throat, mild stomach discomfort, bloating, and nausea. If you were given a sedative during the procedure, it can affect you for several hours. Do not drive until your health care provider says that it is safe. Follow instructions from your health care provider about what you may eat and drink. Return to your normal activities as told by your health care provider. This information is not intended to replace advice given to you by your health care provider. Make sure you discuss any questions you have with your health care provider. Document Revised: 08/08/2021 Document Reviewed: 08/08/2021 Elsevier Patient Education  2024 [employer].

## 2023-02-13 NOTE — Anesthesia Preprocedure Evaluation (Addendum)
Anesthesia Evaluation  Patient identified by MRN, date of birth, ID band Patient awake    Reviewed: Allergy & Precautions, NPO status , Patient's Chart, lab work & pertinent test results  Airway Mallampati: II  TM Distance: >3 FB Neck ROM: Full    Dental no notable dental hx. (+) Dental Advisory Given   Pulmonary sleep apnea , former smoker   Pulmonary exam normal breath sounds clear to auscultation       Cardiovascular hypertension, Pt. on medications and Pt. on home beta blockers + CAD, + Past MI and + Cardiac Stents (2012)  + dysrhythmias Atrial Fibrillation + Valvular Problems/Murmurs MR  Rhythm:Regular Rate:Normal  Mitral valve repaired   Neuro/Psych    Depression       GI/Hepatic negative GI ROS, Neg liver ROS,,,  Endo/Other  negative endocrine ROS    Renal/GU Renal disease  negative genitourinary   Musculoskeletal negative musculoskeletal ROS (+)    Abdominal   Peds  Hematology negative hematology ROS (+)   Anesthesia Other Findings   Reproductive/Obstetrics                             Anesthesia Physical Anesthesia Plan  ASA: 3  Anesthesia Plan: General   Post-op Pain Management: Minimal or no pain anticipated   Induction: Intravenous  PONV Risk Score and Plan:   Airway Management Planned: Nasal Cannula and Natural Airway  Additional Equipment: None  Intra-op Plan:   Post-operative Plan:   Informed Consent: I have reviewed the patients History and Physical, chart, labs and discussed the procedure including the risks, benefits and alternatives for the proposed anesthesia with the patient or authorized representative who has indicated his/her understanding and acceptance.     Dental advisory given  Plan Discussed with: CRNA  Anesthesia Plan Comments: (ECHO 06/27/20: Left ventricular ejection fraction, by estimation, is 60 to 65%. The left ventricle has  normal function. The left ventricle has no regional wall motion abnormalities. 2. Right ventricular systolic function is normal. The right ventricular size is normal. 3. No left atrial/left atrial appendage thrombus was detected. 4. Posterior mitral annulus calcification. Carpentier Class II (prolapse) severe mitral regurgitation directed anteriorly. Prolapse noted in the P2 segment. The mitral regurgitant jet is very eccentric and anteriorly directed, which can underestimate the MR TVI. There is no significant sytolic flow reversal in the pulmonary veins. Regurgitant volume is 95 mL. Regurgitant fraction 56%. ERO 86 mm^2. Mitral valve inflow E/A ratio is 1.4 (>1.2), all consistent with severe MR.. The mitral valve is normal in structure. Severe mitral valve regurgitation. No evidence of mitral stenosis. Moderate mitral annular calcification. 5. The aortic valve is normal in structure. Aortic valve regurgitation is not visualized. No aortic stenosis is present. 6. There is mild (Grade II) layered plaque involving the descending aorta.  Cath 11/21: 1. Nonobstructive CAD except for occlusion of a small ramus branch. The  stent in the distal RCA is widely patent. 2. Mildly elevated LV filling pressures. Moderate V wave noted on PCWP  tracing 3. Mild pulmonary HTN mean 22 mm Hg 4. Normal cardiac output.  Lab Results      Component                Value               Date  WBC                      7.1                 07/14/2020                HGB                      13.6                07/14/2020                HCT                      39.0                07/14/2020                MCV                      97.3                07/14/2020                PLT                      174                 07/14/2020           Lab Results      Component                Value               Date                      NA                       137                 07/14/2020                 K                        3.4 (L)             07/14/2020                CO2                      20 (L)              07/14/2020                GLUCOSE                  129 (H)             07/14/2020                BUN                      13                  07/14/2020                CREATININE  1.14                07/14/2020                CALCIUM                  9.2                 07/14/2020                GFRNONAA                 >60                 07/14/2020                GFRAA                    59 (L)              06/07/2020          )        Anesthesia Quick Evaluation

## 2023-02-14 ENCOUNTER — Encounter (HOSPITAL_COMMUNITY): Payer: Self-pay

## 2023-02-14 ENCOUNTER — Ambulatory Visit (HOSPITAL_COMMUNITY)
Admission: RE | Admit: 2023-02-14 | Discharge: 2023-02-14 | Disposition: A | Discharge status: Home / Self Care | Payer: PPO | Attending: Gastroenterology | Admitting: Gastroenterology

## 2023-02-14 ENCOUNTER — Ambulatory Visit (HOSPITAL_BASED_OUTPATIENT_CLINIC_OR_DEPARTMENT_OTHER): Payer: PPO | Admitting: Anesthesiology

## 2023-02-14 ENCOUNTER — Encounter (HOSPITAL_COMMUNITY)
Admission: RE | Disposition: A | Discharge status: Home / Self Care | Payer: Self-pay | Source: Home / Self Care | Attending: Gastroenterology

## 2023-02-14 ENCOUNTER — Encounter: Payer: PPO | Admitting: Family Medicine

## 2023-02-14 ENCOUNTER — Ambulatory Visit (HOSPITAL_COMMUNITY): Payer: PPO | Admitting: Anesthesiology

## 2023-02-14 DIAGNOSIS — I2510 Atherosclerotic heart disease of native coronary artery without angina pectoris: Secondary | ICD-10-CM

## 2023-02-14 DIAGNOSIS — R195 Other fecal abnormalities: Secondary | ICD-10-CM

## 2023-02-14 DIAGNOSIS — I10 Essential (primary) hypertension: Secondary | ICD-10-CM | POA: Diagnosis not present

## 2023-02-14 DIAGNOSIS — G4730 Sleep apnea, unspecified: Secondary | ICD-10-CM | POA: Insufficient documentation

## 2023-02-14 DIAGNOSIS — Z6824 Body mass index (BMI) 24.0-24.9, adult: Secondary | ICD-10-CM | POA: Diagnosis not present

## 2023-02-14 DIAGNOSIS — Z79899 Other long term (current) drug therapy: Secondary | ICD-10-CM | POA: Insufficient documentation

## 2023-02-14 DIAGNOSIS — K317 Polyp of stomach and duodenum: Secondary | ICD-10-CM

## 2023-02-14 DIAGNOSIS — K921 Melena: Secondary | ICD-10-CM | POA: Diagnosis not present

## 2023-02-14 DIAGNOSIS — F32A Depression, unspecified: Secondary | ICD-10-CM | POA: Insufficient documentation

## 2023-02-14 DIAGNOSIS — Z87891 Personal history of nicotine dependence: Secondary | ICD-10-CM | POA: Insufficient documentation

## 2023-02-14 DIAGNOSIS — K644 Residual hemorrhoidal skin tags: Secondary | ICD-10-CM | POA: Diagnosis not present

## 2023-02-14 DIAGNOSIS — Z860100 Personal history of colon polyps, unspecified: Secondary | ICD-10-CM | POA: Insufficient documentation

## 2023-02-14 DIAGNOSIS — K648 Other hemorrhoids: Secondary | ICD-10-CM

## 2023-02-14 DIAGNOSIS — K30 Functional dyspepsia: Secondary | ICD-10-CM | POA: Diagnosis not present

## 2023-02-14 DIAGNOSIS — K2970 Gastritis, unspecified, without bleeding: Secondary | ICD-10-CM

## 2023-02-14 DIAGNOSIS — I252 Old myocardial infarction: Secondary | ICD-10-CM | POA: Diagnosis not present

## 2023-02-14 DIAGNOSIS — K5909 Other constipation: Secondary | ICD-10-CM | POA: Insufficient documentation

## 2023-02-14 DIAGNOSIS — K3189 Other diseases of stomach and duodenum: Secondary | ICD-10-CM | POA: Diagnosis not present

## 2023-02-14 DIAGNOSIS — R634 Abnormal weight loss: Secondary | ICD-10-CM | POA: Insufficient documentation

## 2023-02-14 DIAGNOSIS — K5730 Diverticulosis of large intestine without perforation or abscess without bleeding: Secondary | ICD-10-CM | POA: Diagnosis not present

## 2023-02-14 DIAGNOSIS — I680 Cerebral amyloid angiopathy: Secondary | ICD-10-CM | POA: Diagnosis not present

## 2023-02-14 DIAGNOSIS — K640 First degree hemorrhoids: Secondary | ICD-10-CM

## 2023-02-14 DIAGNOSIS — E785 Hyperlipidemia, unspecified: Secondary | ICD-10-CM | POA: Diagnosis not present

## 2023-02-14 DIAGNOSIS — Z955 Presence of coronary angioplasty implant and graft: Secondary | ICD-10-CM | POA: Insufficient documentation

## 2023-02-14 HISTORY — PX: BIOPSY: SHX5522

## 2023-02-14 HISTORY — PX: ESOPHAGOGASTRODUODENOSCOPY (EGD) WITH PROPOFOL: SHX5813

## 2023-02-14 HISTORY — PX: COLONOSCOPY WITH PROPOFOL: SHX5780

## 2023-02-14 HISTORY — PX: POLYPECTOMY: SHX5525

## 2023-02-14 LAB — HM COLONOSCOPY

## 2023-02-14 LAB — GLUCOSE, CAPILLARY: Glucose-Capillary: 84 mg/dL (ref 70–99)

## 2023-02-14 SURGERY — COLONOSCOPY WITH PROPOFOL
Anesthesia: General

## 2023-02-14 MED ORDER — LACTATED RINGERS IV SOLN: INTRAVENOUS | Status: DC

## 2023-02-14 MED ORDER — PROPOFOL 10 MG/ML IV BOLUS
INTRAVENOUS | Status: DC | PRN
  Administered 2023-02-14: 20 mg via INTRAVENOUS
  Administered 2023-02-14: 30 mg via INTRAVENOUS
  Administered 2023-02-14: 130 mg via INTRAVENOUS

## 2023-02-14 MED ORDER — PROPOFOL 500 MG/50ML IV EMUL
INTRAVENOUS | Status: DC | PRN
  Administered 2023-02-14: 100 ug/kg/min via INTRAVENOUS

## 2023-02-14 MED ORDER — LIDOCAINE HCL (CARDIAC) PF 100 MG/5ML IV SOSY
PREFILLED_SYRINGE | INTRAVENOUS | Status: DC | PRN
  Administered 2023-02-14: 80 mg via INTRAVENOUS

## 2023-02-14 NOTE — Op Note (Signed)
[facility] Patient Name: Jesse [name] Procedure Date: 02/14/2023 8:26 AM MRN: [id number] Date of Birth: 10-09-1945 Attending MD: Sanjuan Dame , MD, [id number] CSN: [id number] Age: 77 Admit Type: Outpatient Procedure:                Upper GI endoscopy Indications:              Epigastric abdominal pain, Functional Dyspepsia Providers:                Sanjuan Dame, MD, Nena Polio, RN, Zena Amos Referring MD:              Medicines:                Monitored Anesthesia Care Complications:            No immediate complications. Estimated Blood Loss:     Estimated blood loss was minimal. Procedure:                Pre-Anesthesia Assessment:                           - Prior to the procedure, a History and Physical                            was performed, and patient medications and                            allergies were reviewed. The patient's tolerance of                            previous anesthesia was also reviewed. The risks                            and benefits of the procedure and the sedation                            options and risks were discussed with the patient.                            All questions were answered, and informed consent                            was obtained. Prior Anticoagulants: The patient has                            taken no anticoagulant or antiplatelet agents                            except for aspirin. ASA Grade Assessment: III - A                            patient with severe systemic disease. After                            reviewing the risks and benefits, the patient was  deemed in satisfactory condition to undergo the                            procedure.                           - Prior to the procedure, a History and Physical                            was performed, and patient medications and                            allergies were reviewed. The patient's tolerance of                             previous anesthesia was also reviewed. The risks                            and benefits of the procedure and the sedation                            options and risks were discussed with the patient.                            All questions were answered, and informed consent                            was obtained. Prior Anticoagulants: The patient has                            taken no anticoagulant or antiplatelet agents                            except for aspirin. ASA Grade Assessment: III - A                            patient with severe systemic disease. After                            reviewing the risks and benefits, the patient was                            deemed in satisfactory condition to undergo the                            procedure.                           After obtaining informed consent, the endoscope was                            passed under direct vision. Throughout the  procedure, the patient's blood pressure, pulse, and                            oxygen saturations were monitored continuously. The                            [id number]-[id number] ([id number] scope was introduced through the                            mouth, and advanced to the second part of duodenum.                            The upper GI endoscopy was accomplished without                            difficulty. The patient tolerated the procedure                            well. Scope In: 8:36:52 AM Scope Out: 8:43:27 AM Total Procedure Duration: 0 hours 6 minutes 35 seconds  Findings:      The examined esophagus was normal.      A few small erosions with no bleeding and no stigmata of recent bleeding       were found in the gastric antrum. Biopsies were taken with a cold       forceps for histology.      A single 5 mm sessile polyp with no stigmata of recent bleeding was       found in the stomach. The polyp was removed with a cold snare. Resection       and  retrieval were complete.      The duodenal bulb and second portion of the duodenum were normal. Impression:               - Normal esophagus.                           - Erosive gastropathy with no bleeding and no                            stigmata of recent bleeding. Biopsied.                           - A single gastric polyp. Resected and retrieved.                           - Normal duodenal bulb and second portion of the                            duodenum. Moderate Sedation:      Per Anesthesia Care Recommendation:           - Patient has a contact number available for                            emergencies. The signs and symptoms of potential  delayed complications were discussed with the                            patient. Return to normal activities tomorrow.                            Written discharge instructions were provided to the                            patient.                           - Resume previous diet.                           - Continue present medications.                           - Await pathology results. Procedure Code(s):        --- Professional ---                           [phone], Esophagogastroduodenoscopy, flexible,                            transoral; with removal of tumor(s), polyp(s), or                            other lesion(s) by snare technique                           [id number], 59, Esophagogastroduodenoscopy, flexible,                            transoral; with biopsy, single or multiple Diagnosis Code(s):        --- Professional ---                           K31.89, Other diseases of stomach and duodenum                           K31.7, Polyp of stomach and duodenum                           R10.13, Epigastric pain                           K30, Functional dyspepsia CPT copyright 2022 American Medical Association. All rights reserved. The codes documented in this report are preliminary and upon coder review may   be revised to meet current compliance requirements. Sanjuan Dame, MD Sanjuan Dame, MD 02/14/2023 8:46:12 AM This report has been signed electronically. Number of Addenda: 0

## 2023-02-14 NOTE — Op Note (Signed)
[facility] Patient Name: Jesse [name] Procedure Date: 02/14/2023 8:26 AM MRN: [id number] Date of Birth: 26-Nov-1945 Attending MD: Sanjuan Dame , MD, [id number] CSN: [id number] Age: 77 Admit Type: Outpatient Procedure:                Colonoscopy Indications:              Heme positive stool, Weight loss Providers:                Sanjuan Dame, MD, Nena Polio, RN, Zena Amos Referring MD:              Medicines:                Monitored Anesthesia Care Complications:            No immediate complications. Estimated Blood Loss:     Estimated blood loss: none. Procedure:                Pre-Anesthesia Assessment:                           - Prior to the procedure, a History and Physical                            was performed, and patient medications and                            allergies were reviewed. The patient's tolerance of                            previous anesthesia was also reviewed. The risks                            and benefits of the procedure and the sedation                            options and risks were discussed with the patient.                            All questions were answered, and informed consent                            was obtained. Prior Anticoagulants: The patient has                            taken no anticoagulant or antiplatelet agents                            except for aspirin. ASA Grade Assessment: III - A                            patient with severe systemic disease. After                            reviewing the risks and benefits, the patient was  deemed in satisfactory condition to undergo the                            procedure.                           - Prior to the procedure, a History and Physical                            was performed, and patient medications and                            allergies were reviewed. The patient's tolerance of                            previous  anesthesia was also reviewed. The risks                            and benefits of the procedure and the sedation                            options and risks were discussed with the patient.                            All questions were answered, and informed consent                            was obtained. Prior Anticoagulants: The patient has                            taken no anticoagulant or antiplatelet agents                            except for aspirin. ASA Grade Assessment: III - A                            patient with severe systemic disease. After                            reviewing the risks and benefits, the patient was                            deemed in satisfactory condition to undergo the                            procedure.                           After obtaining informed consent, the colonoscope                            was passed under direct vision. Throughout the  procedure, the patient's blood pressure, pulse, and                            oxygen saturations were monitored continuously. The                            [id number] scope was introduced through the                            anus and advanced to the the cecum, identified by                            appendiceal orifice and ileocecal valve. The                            colonoscopy was performed without difficulty. The                            patient tolerated the procedure well. The quality                            of the bowel preparation was evaluated using the                            BBPS [facility] Bowel Preparation Scale) with scores                            of: Right Colon = 3, Transverse Colon = 3 and Left                            Colon = 3 (entire mucosa seen well with no residual                            staining, small fragments of stool or opaque                            liquid). The total BBPS score equals 9. The                             ileocecal valve, appendiceal orifice, and rectum                            were photographed. Scope In: 8:50:43 AM Scope Out: 9:06:57 AM Scope Withdrawal Time: 0 hours 13 minutes 44 seconds  Total Procedure Duration: 0 hours 16 minutes 14 seconds  Findings:      Scattered diverticula were found in the left colon.      Non-bleeding external and internal hemorrhoids were found during       retroflexion. The hemorrhoids were moderate.      The exam was otherwise without abnormality. Impression:               - Diverticulosis in the left colon.                           -  Non-bleeding external and internal hemorrhoids.                           - The examination was otherwise normal.                           - No specimens collected. Moderate Sedation:      Per Anesthesia Care Recommendation:           - Patient has a contact number available for                            emergencies. The signs and symptoms of potential                            delayed complications were discussed with the                            patient. Return to normal activities tomorrow.                            Written discharge instructions were provided to the                            patient.                           - Resume previous diet.                           - Continue present medications.                           - No repeat colonoscopy due to current age (28                            years or older).                           - Return to primary care physician as previously                            scheduled. Procedure Code(s):        --- Professional ---                           [phone], Colonoscopy, flexible; diagnostic, including                            collection of specimen(s) by brushing or washing,                            when performed (separate procedure) Diagnosis Code(s):        --- Professional ---                           U04.5, Other hemorrhoids  R19.5, Other fecal abnormalities                           R63.4, Abnormal weight loss                           K57.30, Diverticulosis of large intestine without                            perforation or abscess without bleeding CPT copyright 2022 American Medical Association. All rights reserved. The codes documented in this report are preliminary and upon coder review may  be revised to meet current compliance requirements. Sanjuan Dame, MD Sanjuan Dame, MD 02/14/2023 9:11:42 AM This report has been signed electronically. Number of Addenda: 0

## 2023-02-14 NOTE — Transfer of Care (Signed)
Immediate Anesthesia Transfer of Care Note  Patient: Jesse [name]  Procedure(s) Performed: COLONOSCOPY WITH PROPOFOL ESOPHAGOGASTRODUODENOSCOPY (EGD) WITH PROPOFOL BIOPSY POLYPECTOMY  Patient Location: Short Stay  Anesthesia Type:General  Level of Consciousness: awake  Airway & Oxygen Therapy: Patient Spontanous Breathing  Post-op Assessment: Report given to RN and Post -op Vital signs reviewed and stable  Post vital signs: Reviewed and stable  Last Vitals:  Vitals Value Taken Time  BP 106/60 02/14/23 0913  Temp 36.5 C 02/14/23 0913  Pulse 78 02/14/23 0913  Resp 14 02/14/23 0913  SpO2 99 % 02/14/23 0913    Last Pain:  Vitals:   02/14/23 0913  TempSrc: Oral  PainSc: 0-No pain         Complications: No notable events documented.

## 2023-02-14 NOTE — Interval H&P Note (Signed)
History and Physical Interval Note:  02/14/2023 7:39 AM  Jesse [name]  has presented today for surgery, with the diagnosis of unintended weight loss, fecal occult blood.  The various methods of treatment have been discussed with the patient and family. After consideration of risks, benefits and other options for treatment, the patient has consented to  Procedure(s) with comments: COLONOSCOPY WITH PROPOFOL (N/A) - 8:30am;asa 3 ESOPHAGOGASTRODUODENOSCOPY (EGD) WITH PROPOFOL (N/A) - 8:30am;asa 3 as a surgical intervention.  The patient's history has been reviewed, patient examined, no change in status, stable for surgery.  I have reviewed the patient's chart and labs.  Questions were answered to the patient's satisfaction.     [name]

## 2023-02-14 NOTE — Discharge Instructions (Signed)

## 2023-02-14 NOTE — Anesthesia Postprocedure Evaluation (Signed)
Anesthesia Post Note  Patient: Jesse [name]  Procedure(s) Performed: COLONOSCOPY WITH PROPOFOL ESOPHAGOGASTRODUODENOSCOPY (EGD) WITH PROPOFOL BIOPSY POLYPECTOMY  Patient location during evaluation: PACU Anesthesia Type: General Level of consciousness: awake and alert Pain management: pain level controlled Vital Signs Assessment: post-procedure vital signs reviewed and stable Respiratory status: spontaneous breathing, nonlabored ventilation, respiratory function stable and patient connected to nasal cannula oxygen Cardiovascular status: blood pressure returned to baseline and stable Postop Assessment: no apparent nausea or vomiting Anesthetic complications: no   There were no known notable events for this encounter.   Last Vitals:  Vitals:   02/14/23 0718 02/14/23 0913  BP: (!) 174/94 106/60  Pulse: 89 78  Resp: 20 14  Temp: 36.5 C 36.5 C  SpO2: 98% 99%    Last Pain:  Vitals:   02/14/23 0913  TempSrc: Oral  PainSc: 0-No pain                 Cherisse Carrell L Ralphine Hinks

## 2023-02-14 NOTE — Anesthesia Procedure Notes (Signed)
Date/Time: 02/14/2023 8:31 AM  Performed by: Franco Nones, CRNAPre-anesthesia Checklist: Patient identified, Emergency Drugs available, Suction available, Timeout performed and Patient being monitored Patient Re-evaluated:Patient Re-evaluated prior to induction Oxygen Delivery Method: Nasal Cannula

## 2023-02-17 ENCOUNTER — Encounter (INDEPENDENT_AMBULATORY_CARE_PROVIDER_SITE_OTHER): Payer: Self-pay | Admitting: *Deleted

## 2023-02-17 ENCOUNTER — Encounter: Payer: Self-pay | Admitting: Family Medicine

## 2023-02-17 LAB — SURGICAL PATHOLOGY

## 2023-02-17 NOTE — Progress Notes (Signed)
I reviewed the pathology results. Ann, can you send her a letter with the findings as described below please?   Thanks,  Vista Lawman, MD Gastroenterology and Hepatology [facility]  ---------------------------------------------------------------------------------------------  [facility] [address], [place], [place] [address] Phone:  [phone]   02/17/23 [name], [place]   Dear Jesse [name],  I am writing to inform you that the biopsies taken during your recent endoscopic examination showed:  No H. Pylori bacteria in stomach , or any early cancer changes to the stomach mucosa ( Intestinal metaplasia)  The polyp in the stomach is benign and nothing to worry about   Colonoscopy had no polyps and to pursue further colonoscopy after age 28 , the decision is individualized  Please call us at [phone] if you have persistent problems or have questions about your condition that have not been fully answered at this time.  Sincerely,  Vista Lawman, MD Gastroenterology and Hepatology

## 2023-02-18 ENCOUNTER — Encounter: Payer: Self-pay | Admitting: *Deleted

## 2023-02-18 DIAGNOSIS — M25559 Pain in unspecified hip: Secondary | ICD-10-CM | POA: Diagnosis not present

## 2023-02-18 DIAGNOSIS — R102 Pelvic and perineal pain: Secondary | ICD-10-CM | POA: Diagnosis not present

## 2023-02-18 DIAGNOSIS — R2681 Unsteadiness on feet: Secondary | ICD-10-CM | POA: Diagnosis not present

## 2023-02-19 ENCOUNTER — Encounter (INDEPENDENT_AMBULATORY_CARE_PROVIDER_SITE_OTHER): Payer: Self-pay | Admitting: *Deleted

## 2023-02-20 DIAGNOSIS — R2681 Unsteadiness on feet: Secondary | ICD-10-CM | POA: Diagnosis not present

## 2023-02-20 DIAGNOSIS — M25559 Pain in unspecified hip: Secondary | ICD-10-CM | POA: Diagnosis not present

## 2023-02-20 DIAGNOSIS — R102 Pelvic and perineal pain: Secondary | ICD-10-CM | POA: Diagnosis not present

## 2023-02-22 DIAGNOSIS — S3210XD Unspecified fracture of sacrum, subsequent encounter for fracture with routine healing: Secondary | ICD-10-CM | POA: Diagnosis not present

## 2023-02-22 DIAGNOSIS — S3289XD Fracture of other parts of pelvis, subsequent encounter for fracture with routine healing: Secondary | ICD-10-CM | POA: Diagnosis not present

## 2023-02-22 DIAGNOSIS — N183 Chronic kidney disease, stage 3 unspecified: Secondary | ICD-10-CM | POA: Diagnosis not present

## 2023-02-22 DIAGNOSIS — G4733 Obstructive sleep apnea (adult) (pediatric): Secondary | ICD-10-CM | POA: Diagnosis not present

## 2023-02-22 DIAGNOSIS — S32591D Other specified fracture of right pubis, subsequent encounter for fracture with routine healing: Secondary | ICD-10-CM | POA: Diagnosis not present

## 2023-02-22 DIAGNOSIS — I2510 Atherosclerotic heart disease of native coronary artery without angina pectoris: Secondary | ICD-10-CM | POA: Diagnosis not present

## 2023-02-24 ENCOUNTER — Encounter (HOSPITAL_COMMUNITY): Payer: Self-pay | Admitting: Gastroenterology

## 2023-02-25 DIAGNOSIS — R2681 Unsteadiness on feet: Secondary | ICD-10-CM | POA: Diagnosis not present

## 2023-02-25 DIAGNOSIS — R102 Pelvic and perineal pain: Secondary | ICD-10-CM | POA: Diagnosis not present

## 2023-02-25 DIAGNOSIS — M25559 Pain in unspecified hip: Secondary | ICD-10-CM | POA: Diagnosis not present

## 2023-02-27 ENCOUNTER — Encounter (INDEPENDENT_AMBULATORY_CARE_PROVIDER_SITE_OTHER): Payer: Self-pay | Admitting: Gastroenterology

## 2023-02-27 DIAGNOSIS — M25559 Pain in unspecified hip: Secondary | ICD-10-CM | POA: Diagnosis not present

## 2023-02-27 DIAGNOSIS — R2681 Unsteadiness on feet: Secondary | ICD-10-CM | POA: Diagnosis not present

## 2023-02-27 DIAGNOSIS — R102 Pelvic and perineal pain: Secondary | ICD-10-CM | POA: Diagnosis not present

## 2023-03-04 DIAGNOSIS — R102 Pelvic and perineal pain: Secondary | ICD-10-CM | POA: Diagnosis not present

## 2023-03-04 DIAGNOSIS — M25559 Pain in unspecified hip: Secondary | ICD-10-CM | POA: Diagnosis not present

## 2023-03-04 DIAGNOSIS — R2681 Unsteadiness on feet: Secondary | ICD-10-CM | POA: Diagnosis not present

## 2023-03-11 ENCOUNTER — Ambulatory Visit (HOSPITAL_COMMUNITY): Payer: PPO

## 2023-03-11 DIAGNOSIS — R102 Pelvic and perineal pain: Secondary | ICD-10-CM | POA: Diagnosis not present

## 2023-03-11 DIAGNOSIS — R2681 Unsteadiness on feet: Secondary | ICD-10-CM | POA: Diagnosis not present

## 2023-03-11 DIAGNOSIS — M25559 Pain in unspecified hip: Secondary | ICD-10-CM | POA: Diagnosis not present

## 2023-03-14 DIAGNOSIS — M25559 Pain in unspecified hip: Secondary | ICD-10-CM | POA: Diagnosis not present

## 2023-03-14 DIAGNOSIS — R102 Pelvic and perineal pain: Secondary | ICD-10-CM | POA: Diagnosis not present

## 2023-03-14 DIAGNOSIS — R2681 Unsteadiness on feet: Secondary | ICD-10-CM | POA: Diagnosis not present

## 2023-03-18 DIAGNOSIS — R2681 Unsteadiness on feet: Secondary | ICD-10-CM | POA: Diagnosis not present

## 2023-03-18 DIAGNOSIS — R102 Pelvic and perineal pain: Secondary | ICD-10-CM | POA: Diagnosis not present

## 2023-03-18 DIAGNOSIS — M25559 Pain in unspecified hip: Secondary | ICD-10-CM | POA: Diagnosis not present

## 2023-03-20 DIAGNOSIS — M25559 Pain in unspecified hip: Secondary | ICD-10-CM | POA: Diagnosis not present

## 2023-03-20 DIAGNOSIS — R102 Pelvic and perineal pain: Secondary | ICD-10-CM | POA: Diagnosis not present

## 2023-03-20 DIAGNOSIS — R2681 Unsteadiness on feet: Secondary | ICD-10-CM | POA: Diagnosis not present

## 2023-03-25 DIAGNOSIS — N183 Chronic kidney disease, stage 3 unspecified: Secondary | ICD-10-CM | POA: Diagnosis not present

## 2023-03-25 DIAGNOSIS — S32591D Other specified fracture of right pubis, subsequent encounter for fracture with routine healing: Secondary | ICD-10-CM | POA: Diagnosis not present

## 2023-03-25 DIAGNOSIS — I2510 Atherosclerotic heart disease of native coronary artery without angina pectoris: Secondary | ICD-10-CM | POA: Diagnosis not present

## 2023-03-25 DIAGNOSIS — G4733 Obstructive sleep apnea (adult) (pediatric): Secondary | ICD-10-CM | POA: Diagnosis not present

## 2023-03-25 DIAGNOSIS — R2681 Unsteadiness on feet: Secondary | ICD-10-CM | POA: Diagnosis not present

## 2023-03-25 DIAGNOSIS — S3210XD Unspecified fracture of sacrum, subsequent encounter for fracture with routine healing: Secondary | ICD-10-CM | POA: Diagnosis not present

## 2023-03-25 DIAGNOSIS — S3289XD Fracture of other parts of pelvis, subsequent encounter for fracture with routine healing: Secondary | ICD-10-CM | POA: Diagnosis not present

## 2023-03-25 DIAGNOSIS — R102 Pelvic and perineal pain: Secondary | ICD-10-CM | POA: Diagnosis not present

## 2023-03-25 DIAGNOSIS — M25559 Pain in unspecified hip: Secondary | ICD-10-CM | POA: Diagnosis not present

## 2023-03-28 DIAGNOSIS — M25559 Pain in unspecified hip: Secondary | ICD-10-CM | POA: Diagnosis not present

## 2023-03-28 DIAGNOSIS — R2681 Unsteadiness on feet: Secondary | ICD-10-CM | POA: Diagnosis not present

## 2023-03-28 DIAGNOSIS — R102 Pelvic and perineal pain: Secondary | ICD-10-CM | POA: Diagnosis not present

## 2023-04-03 DIAGNOSIS — R102 Pelvic and perineal pain: Secondary | ICD-10-CM | POA: Diagnosis not present

## 2023-04-03 DIAGNOSIS — M25559 Pain in unspecified hip: Secondary | ICD-10-CM | POA: Diagnosis not present

## 2023-04-03 DIAGNOSIS — R2681 Unsteadiness on feet: Secondary | ICD-10-CM | POA: Diagnosis not present

## 2023-04-07 ENCOUNTER — Ambulatory Visit: Payer: PPO | Admitting: Physician Assistant

## 2023-04-15 DIAGNOSIS — R2681 Unsteadiness on feet: Secondary | ICD-10-CM | POA: Diagnosis not present

## 2023-04-15 DIAGNOSIS — M25559 Pain in unspecified hip: Secondary | ICD-10-CM | POA: Diagnosis not present

## 2023-04-15 DIAGNOSIS — R102 Pelvic and perineal pain: Secondary | ICD-10-CM | POA: Diagnosis not present

## 2023-04-17 DIAGNOSIS — R2681 Unsteadiness on feet: Secondary | ICD-10-CM | POA: Diagnosis not present

## 2023-04-17 DIAGNOSIS — M25559 Pain in unspecified hip: Secondary | ICD-10-CM | POA: Diagnosis not present

## 2023-04-17 DIAGNOSIS — R102 Pelvic and perineal pain: Secondary | ICD-10-CM | POA: Diagnosis not present

## 2023-04-22 DIAGNOSIS — R102 Pelvic and perineal pain: Secondary | ICD-10-CM | POA: Diagnosis not present

## 2023-04-22 DIAGNOSIS — R2681 Unsteadiness on feet: Secondary | ICD-10-CM | POA: Diagnosis not present

## 2023-04-22 DIAGNOSIS — M25559 Pain in unspecified hip: Secondary | ICD-10-CM | POA: Diagnosis not present

## 2023-04-24 DIAGNOSIS — S3210XD Unspecified fracture of sacrum, subsequent encounter for fracture with routine healing: Secondary | ICD-10-CM | POA: Diagnosis not present

## 2023-04-24 DIAGNOSIS — S32591D Other specified fracture of right pubis, subsequent encounter for fracture with routine healing: Secondary | ICD-10-CM | POA: Diagnosis not present

## 2023-04-24 DIAGNOSIS — S3289XD Fracture of other parts of pelvis, subsequent encounter for fracture with routine healing: Secondary | ICD-10-CM | POA: Diagnosis not present

## 2023-04-24 DIAGNOSIS — M25559 Pain in unspecified hip: Secondary | ICD-10-CM | POA: Diagnosis not present

## 2023-04-24 DIAGNOSIS — G4733 Obstructive sleep apnea (adult) (pediatric): Secondary | ICD-10-CM | POA: Diagnosis not present

## 2023-04-24 DIAGNOSIS — R102 Pelvic and perineal pain: Secondary | ICD-10-CM | POA: Diagnosis not present

## 2023-04-24 DIAGNOSIS — I2510 Atherosclerotic heart disease of native coronary artery without angina pectoris: Secondary | ICD-10-CM | POA: Diagnosis not present

## 2023-04-24 DIAGNOSIS — R2681 Unsteadiness on feet: Secondary | ICD-10-CM | POA: Diagnosis not present

## 2023-04-24 DIAGNOSIS — N183 Chronic kidney disease, stage 3 unspecified: Secondary | ICD-10-CM | POA: Diagnosis not present

## 2023-04-29 DIAGNOSIS — R102 Pelvic and perineal pain: Secondary | ICD-10-CM | POA: Diagnosis not present

## 2023-04-29 DIAGNOSIS — H903 Sensorineural hearing loss, bilateral: Secondary | ICD-10-CM | POA: Diagnosis not present

## 2023-04-29 DIAGNOSIS — M25559 Pain in unspecified hip: Secondary | ICD-10-CM | POA: Diagnosis not present

## 2023-04-29 DIAGNOSIS — R2681 Unsteadiness on feet: Secondary | ICD-10-CM | POA: Diagnosis not present

## 2023-05-01 ENCOUNTER — Other Ambulatory Visit: Payer: Self-pay | Admitting: Family Medicine

## 2023-05-01 DIAGNOSIS — R2681 Unsteadiness on feet: Secondary | ICD-10-CM | POA: Diagnosis not present

## 2023-05-01 DIAGNOSIS — R454 Irritability and anger: Secondary | ICD-10-CM

## 2023-05-01 DIAGNOSIS — M25559 Pain in unspecified hip: Secondary | ICD-10-CM | POA: Diagnosis not present

## 2023-05-01 DIAGNOSIS — R102 Pelvic and perineal pain: Secondary | ICD-10-CM | POA: Diagnosis not present

## 2023-05-13 DIAGNOSIS — M25559 Pain in unspecified hip: Secondary | ICD-10-CM | POA: Diagnosis not present

## 2023-05-13 DIAGNOSIS — R2681 Unsteadiness on feet: Secondary | ICD-10-CM | POA: Diagnosis not present

## 2023-05-13 DIAGNOSIS — R102 Pelvic and perineal pain: Secondary | ICD-10-CM | POA: Diagnosis not present

## 2023-05-15 DIAGNOSIS — R2681 Unsteadiness on feet: Secondary | ICD-10-CM | POA: Diagnosis not present

## 2023-05-15 DIAGNOSIS — R102 Pelvic and perineal pain: Secondary | ICD-10-CM | POA: Diagnosis not present

## 2023-05-15 DIAGNOSIS — M25559 Pain in unspecified hip: Secondary | ICD-10-CM | POA: Diagnosis not present

## 2023-05-19 ENCOUNTER — Ambulatory Visit (INDEPENDENT_AMBULATORY_CARE_PROVIDER_SITE_OTHER): Payer: PPO | Admitting: Family Medicine

## 2023-05-19 ENCOUNTER — Encounter: Payer: Self-pay | Admitting: Family Medicine

## 2023-05-19 VITALS — BP 150/100 | HR 93 | Temp 97.9°F | Resp 18 | Ht 68.75 in | Wt 178.0 lb

## 2023-05-19 DIAGNOSIS — E559 Vitamin D deficiency, unspecified: Secondary | ICD-10-CM | POA: Diagnosis not present

## 2023-05-19 DIAGNOSIS — E785 Hyperlipidemia, unspecified: Secondary | ICD-10-CM

## 2023-05-19 DIAGNOSIS — R195 Other fecal abnormalities: Secondary | ICD-10-CM

## 2023-05-19 DIAGNOSIS — I10 Essential (primary) hypertension: Secondary | ICD-10-CM | POA: Diagnosis not present

## 2023-05-19 DIAGNOSIS — Z7189 Other specified counseling: Secondary | ICD-10-CM | POA: Diagnosis not present

## 2023-05-19 DIAGNOSIS — I2510 Atherosclerotic heart disease of native coronary artery without angina pectoris: Secondary | ICD-10-CM

## 2023-05-19 DIAGNOSIS — Z0000 Encounter for general adult medical examination without abnormal findings: Secondary | ICD-10-CM | POA: Diagnosis not present

## 2023-05-19 DIAGNOSIS — G4733 Obstructive sleep apnea (adult) (pediatric): Secondary | ICD-10-CM | POA: Diagnosis not present

## 2023-05-19 DIAGNOSIS — E854 Organ-limited amyloidosis: Secondary | ICD-10-CM | POA: Diagnosis not present

## 2023-05-19 DIAGNOSIS — R2689 Other abnormalities of gait and mobility: Secondary | ICD-10-CM

## 2023-05-19 DIAGNOSIS — Z23 Encounter for immunization: Secondary | ICD-10-CM | POA: Diagnosis not present

## 2023-05-19 DIAGNOSIS — N183 Chronic kidney disease, stage 3 unspecified: Secondary | ICD-10-CM | POA: Diagnosis not present

## 2023-05-19 DIAGNOSIS — Z9889 Other specified postprocedural states: Secondary | ICD-10-CM

## 2023-05-19 DIAGNOSIS — R454 Irritability and anger: Secondary | ICD-10-CM | POA: Diagnosis not present

## 2023-05-19 DIAGNOSIS — R634 Abnormal weight loss: Secondary | ICD-10-CM

## 2023-05-19 DIAGNOSIS — R7303 Prediabetes: Secondary | ICD-10-CM

## 2023-05-19 DIAGNOSIS — H905 Unspecified sensorineural hearing loss: Secondary | ICD-10-CM

## 2023-05-19 DIAGNOSIS — Z85820 Personal history of malignant melanoma of skin: Secondary | ICD-10-CM

## 2023-05-19 MED ORDER — TAMSULOSIN HCL 0.4 MG PO CAPS: 0.4000 mg | ORAL_CAPSULE | Freq: Every day | ORAL | 4 refills | Status: DC

## 2023-05-19 MED ORDER — BUPROPION HCL ER (SR) 100 MG PO TB12: 100.0000 mg | ORAL_TABLET | Freq: Every morning | ORAL | 4 refills | Status: DC

## 2023-05-19 MED ORDER — CARVEDILOL 12.5 MG PO TABS: 12.5000 mg | ORAL_TABLET | Freq: Two times a day (BID) | ORAL | 4 refills | Status: DC

## 2023-05-19 MED ORDER — POLYETHYLENE GLYCOL 3350 17 GM/SCOOP PO POWD: 17.0000 g | Freq: Every day | ORAL | Status: AC | PRN
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
## 2023-05-19 NOTE — Assessment & Plan Note (Addendum)
 Advanced directive discussion - reviewed in [place] are wife Lurena Joiner followed by Thornton Papas and Mardelle Matte. Does not want life prolonging measures if terminal illness.

## 2023-05-19 NOTE — Assessment & Plan Note (Signed)
 Preventative protocols reviewed and updated unless pt declined. Discussed healthy diet and lifestyle.

## 2023-05-19 NOTE — Patient Instructions (Addendum)
 Labs today  Flu shot today  Increase carvedilol  to 12.5mg  twice daily (higher dose sent to pharmacy). Monitoring blood pressures at home, BP log sheet provided.  Work on good water intake to stay well hydrated.  Check with pharmacy on date of 2nd shingles shot and let us  know.  Good to see you today Return as needed or in 6 months for follow up visit.

## 2023-05-20 LAB — COMPREHENSIVE METABOLIC PANEL
ALT: 20 U/L (ref 0–53)
AST: 26 U/L (ref 0–37)
Albumin: 4.6 g/dL (ref 3.5–5.2)
Alkaline Phosphatase: 98 U/L (ref 39–117)
BUN: 19 mg/dL (ref 6–23)
CO2: 29 meq/L (ref 19–32)
Calcium: 9.8 mg/dL (ref 8.4–10.5)
Chloride: 99 meq/L (ref 96–112)
Creatinine, Ser: 1.52 mg/dL — ABNORMAL HIGH (ref 0.40–1.50)
GFR: 43.87 mL/min — ABNORMAL LOW (ref >60.00)
Glucose, Bld: 107 mg/dL — ABNORMAL HIGH (ref 70–99)
Potassium: 4.1 meq/L (ref 3.5–5.1)
Sodium: 138 meq/L (ref 135–145)
Total Bilirubin: 1 mg/dL (ref 0.2–1.2)
Total Protein: 7.9 g/dL (ref 6.0–8.3)

## 2023-05-20 LAB — CBC WITH DIFFERENTIAL/PLATELET
Basophils Absolute: 0 10*3/uL (ref 0.0–0.1)
Basophils Relative: 0.5 % (ref 0.0–3.0)
Eosinophils Absolute: 0.1 10*3/uL (ref 0.0–0.7)
Eosinophils Relative: 1.4 % (ref 0.0–5.0)
HCT: 44.1 % (ref 39.0–52.0)
Hemoglobin: 15 g/dL (ref 13.0–17.0)
Lymphocytes Relative: 26.2 % (ref 12.0–46.0)
Lymphs Abs: 1.9 10*3/uL (ref 0.7–4.0)
MCHC: 34 g/dL (ref 30.0–36.0)
MCV: 97.4 fL (ref 78.0–100.0)
Monocytes Absolute: 0.4 10*3/uL (ref 0.1–1.0)
Monocytes Relative: 5.8 % (ref 3.0–12.0)
Neutro Abs: 4.9 10*3/uL (ref 1.4–7.7)
Neutrophils Relative %: 66.1 % (ref 43.0–77.0)
Platelets: 275 10*3/uL (ref 150.0–400.0)
RBC: 4.53 Mil/uL (ref 4.22–5.81)
RDW: 14 % (ref 11.5–15.5)
WBC: 7.4 10*3/uL (ref 4.0–10.5)

## 2023-05-20 LAB — MICROALBUMIN / CREATININE URINE RATIO
Creatinine,U: 30.9 mg/dL
Microalb Creat Ratio: 9.2 mg/g (ref 0.0–30.0)
Microalb, Ur: 2.9 mg/dL — ABNORMAL HIGH (ref 0.0–1.9)

## 2023-05-20 LAB — PARATHYROID HORMONE, INTACT (NO CA): PTH: 46 pg/mL (ref 16–77)

## 2023-05-20 LAB — LIPID PANEL
Cholesterol: 184 mg/dL (ref 0–200)
HDL: 67.3 mg/dL (ref >39.00)
LDL Cholesterol: 100 mg/dL — ABNORMAL HIGH (ref 0–99)
NonHDL: 116.89
Total CHOL/HDL Ratio: 3
Triglycerides: 85 mg/dL (ref 0.0–149.0)
VLDL: 17 mg/dL (ref 0.0–40.0)

## 2023-05-20 LAB — HEMOGLOBIN A1C: Hgb A1c MFr Bld: 6.3 % (ref 4.6–6.5)

## 2023-05-20 LAB — PHOSPHORUS: Phosphorus: 3.7 mg/dL (ref 2.3–4.6)

## 2023-05-20 LAB — VITAMIN D 25 HYDROXY (VIT D DEFICIENCY, FRACTURES): VITD: 51.59 ng/mL (ref 30.00–100.00)

## 2023-05-20 NOTE — Assessment & Plan Note (Addendum)
 Chronic, update FLP. Continue atorvastatin  80mg  daily. The 10-year ASCVD risk score (Arnett DK, et al., 2019) is: 54.4%   Values used to calculate the score:     Age: 78 years     Sex: Male     Is Non-Hispanic African American: No     Diabetic: Yes     Tobacco smoker: No     Systolic Blood Pressure: 150 mmHg     Is BP treated: Yes     HDL Cholesterol: 75 mg/dL     Total Cholesterol: 140 mg/dL

## 2023-05-20 NOTE — Assessment & Plan Note (Signed)
 Continues seeing dermatology regularly.

## 2023-05-20 NOTE — Assessment & Plan Note (Signed)
 Appreciate cards care.

## 2023-05-20 NOTE — Assessment & Plan Note (Signed)
 Intermittently ambulating with cane, did complete HHPT, continues outpatient PT twice weekly.

## 2023-05-20 NOTE — Assessment & Plan Note (Signed)
 Update renal panel. H/o acute renal failure when on Hyzaar with Creatinine peak to 2.95 (2017).

## 2023-05-20 NOTE — Assessment & Plan Note (Addendum)
Weight gain noted.

## 2023-05-20 NOTE — Assessment & Plan Note (Signed)
 Last saw [facility] neurology Penumalli, pending establishing with [facility] Dr Sherryll Burger.  Known memory difficulty as per below.

## 2023-05-20 NOTE — Assessment & Plan Note (Signed)
 Suspected but hasn't undergone sleep study that I can see, previously had declined CPAP treatment.

## 2023-05-20 NOTE — Assessment & Plan Note (Signed)
 Intermittently uses hearing aides, not using today.

## 2023-05-20 NOTE — Assessment & Plan Note (Signed)
 S/p reassuring colonoscopy 02/2023 ([facility])

## 2023-05-20 NOTE — Assessment & Plan Note (Signed)
 Update A1c ?

## 2023-05-20 NOTE — Assessment & Plan Note (Addendum)
 Chronic, persistently elevated despite carvedilol twice daily - will increase dose from 6.25 to 12.5mg  bid. Reassess at f/u visit.  BP log sheet provided to start monitoring closely at home, update if persistently >140/90

## 2023-05-20 NOTE — Assessment & Plan Note (Signed)
 Update levels on vit D 1000 international units OTC replacement

## 2023-05-20 NOTE — Assessment & Plan Note (Signed)
 Chronic issue, improved on wellbutrin SR 100mg  daily.  H/o intolerance to paxil, sertraline (diarrhea)

## 2023-05-22 DIAGNOSIS — R2681 Unsteadiness on feet: Secondary | ICD-10-CM | POA: Diagnosis not present

## 2023-05-22 DIAGNOSIS — R102 Pelvic and perineal pain: Secondary | ICD-10-CM | POA: Diagnosis not present

## 2023-05-22 DIAGNOSIS — M25559 Pain in unspecified hip: Secondary | ICD-10-CM | POA: Diagnosis not present

## 2023-05-25 DIAGNOSIS — N183 Chronic kidney disease, stage 3 unspecified: Secondary | ICD-10-CM | POA: Diagnosis not present

## 2023-05-25 DIAGNOSIS — S32591D Other specified fracture of right pubis, subsequent encounter for fracture with routine healing: Secondary | ICD-10-CM | POA: Diagnosis not present

## 2023-05-25 DIAGNOSIS — S3289XD Fracture of other parts of pelvis, subsequent encounter for fracture with routine healing: Secondary | ICD-10-CM | POA: Diagnosis not present

## 2023-05-25 DIAGNOSIS — G4733 Obstructive sleep apnea (adult) (pediatric): Secondary | ICD-10-CM | POA: Diagnosis not present

## 2023-05-25 DIAGNOSIS — I2510 Atherosclerotic heart disease of native coronary artery without angina pectoris: Secondary | ICD-10-CM | POA: Diagnosis not present

## 2023-05-25 DIAGNOSIS — S3210XD Unspecified fracture of sacrum, subsequent encounter for fracture with routine healing: Secondary | ICD-10-CM | POA: Diagnosis not present

## 2023-05-29 DIAGNOSIS — R2681 Unsteadiness on feet: Secondary | ICD-10-CM | POA: Diagnosis not present

## 2023-05-29 DIAGNOSIS — M25559 Pain in unspecified hip: Secondary | ICD-10-CM | POA: Diagnosis not present

## 2023-05-29 DIAGNOSIS — R102 Pelvic and perineal pain: Secondary | ICD-10-CM | POA: Diagnosis not present

## 2023-06-10 DIAGNOSIS — M25559 Pain in unspecified hip: Secondary | ICD-10-CM | POA: Diagnosis not present

## 2023-06-10 DIAGNOSIS — R102 Pelvic and perineal pain: Secondary | ICD-10-CM | POA: Diagnosis not present

## 2023-06-10 DIAGNOSIS — R2681 Unsteadiness on feet: Secondary | ICD-10-CM | POA: Diagnosis not present

## 2023-06-12 DIAGNOSIS — G3184 Mild cognitive impairment, so stated: Secondary | ICD-10-CM | POA: Diagnosis not present

## 2023-06-12 DIAGNOSIS — I639 Cerebral infarction, unspecified: Secondary | ICD-10-CM | POA: Diagnosis not present

## 2023-06-12 DIAGNOSIS — Z79899 Other long term (current) drug therapy: Secondary | ICD-10-CM | POA: Diagnosis not present

## 2023-06-16 ENCOUNTER — Telehealth: Payer: Self-pay | Admitting: Cardiology

## 2023-06-16 NOTE — Telephone Encounter (Signed)
SEE previous note 

## 2023-06-16 NOTE — Telephone Encounter (Signed)
Patient wants a call back directly from RN Cheryl. 

## 2023-06-16 NOTE — Telephone Encounter (Signed)
Pt spouse returning call  

## 2023-06-16 NOTE — Telephone Encounter (Signed)
 Called patient with no answer. Left message to return call.

## 2023-06-16 NOTE — Telephone Encounter (Signed)
After Hours message received from patient requesting to speak with Elnita Maxwell, no other information provided.

## 2023-06-16 NOTE — Telephone Encounter (Signed)
Called and spoke to patient's wife. Verified patient's name and DOB. She is requesting a 1 year follow up appt. Patient scheduled for 4/11 at 9:40 with Dr [place].

## 2023-06-17 DIAGNOSIS — M25559 Pain in unspecified hip: Secondary | ICD-10-CM | POA: Diagnosis not present

## 2023-06-17 DIAGNOSIS — R2681 Unsteadiness on feet: Secondary | ICD-10-CM | POA: Diagnosis not present

## 2023-06-17 DIAGNOSIS — R102 Pelvic and perineal pain: Secondary | ICD-10-CM | POA: Diagnosis not present

## 2023-06-19 DIAGNOSIS — R102 Pelvic and perineal pain: Secondary | ICD-10-CM | POA: Diagnosis not present

## 2023-06-19 DIAGNOSIS — R2681 Unsteadiness on feet: Secondary | ICD-10-CM | POA: Diagnosis not present

## 2023-06-19 DIAGNOSIS — M25559 Pain in unspecified hip: Secondary | ICD-10-CM | POA: Diagnosis not present

## 2023-06-23 DIAGNOSIS — Z08 Encounter for follow-up examination after completed treatment for malignant neoplasm: Secondary | ICD-10-CM | POA: Diagnosis not present

## 2023-06-23 DIAGNOSIS — Z85828 Personal history of other malignant neoplasm of skin: Secondary | ICD-10-CM | POA: Diagnosis not present

## 2023-06-25 DIAGNOSIS — I2510 Atherosclerotic heart disease of native coronary artery without angina pectoris: Secondary | ICD-10-CM | POA: Diagnosis not present

## 2023-06-25 DIAGNOSIS — G4733 Obstructive sleep apnea (adult) (pediatric): Secondary | ICD-10-CM | POA: Diagnosis not present

## 2023-06-25 DIAGNOSIS — S3210XD Unspecified fracture of sacrum, subsequent encounter for fracture with routine healing: Secondary | ICD-10-CM | POA: Diagnosis not present

## 2023-06-25 DIAGNOSIS — S32591D Other specified fracture of right pubis, subsequent encounter for fracture with routine healing: Secondary | ICD-10-CM | POA: Diagnosis not present

## 2023-06-25 DIAGNOSIS — N183 Chronic kidney disease, stage 3 unspecified: Secondary | ICD-10-CM | POA: Diagnosis not present

## 2023-06-25 DIAGNOSIS — S3289XD Fracture of other parts of pelvis, subsequent encounter for fracture with routine healing: Secondary | ICD-10-CM | POA: Diagnosis not present

## 2023-06-26 DIAGNOSIS — R2681 Unsteadiness on feet: Secondary | ICD-10-CM | POA: Diagnosis not present

## 2023-06-26 DIAGNOSIS — M25559 Pain in unspecified hip: Secondary | ICD-10-CM | POA: Diagnosis not present

## 2023-06-26 DIAGNOSIS — R102 Pelvic and perineal pain: Secondary | ICD-10-CM | POA: Diagnosis not present

## 2023-07-01 DIAGNOSIS — R102 Pelvic and perineal pain: Secondary | ICD-10-CM | POA: Diagnosis not present

## 2023-07-01 DIAGNOSIS — R2681 Unsteadiness on feet: Secondary | ICD-10-CM | POA: Diagnosis not present

## 2023-07-01 DIAGNOSIS — M25559 Pain in unspecified hip: Secondary | ICD-10-CM | POA: Diagnosis not present

## 2023-07-08 DIAGNOSIS — M25559 Pain in unspecified hip: Secondary | ICD-10-CM | POA: Diagnosis not present

## 2023-07-08 DIAGNOSIS — R102 Pelvic and perineal pain: Secondary | ICD-10-CM | POA: Diagnosis not present

## 2023-07-08 DIAGNOSIS — R2681 Unsteadiness on feet: Secondary | ICD-10-CM | POA: Diagnosis not present

## 2023-07-10 DIAGNOSIS — M25559 Pain in unspecified hip: Secondary | ICD-10-CM | POA: Diagnosis not present

## 2023-07-10 DIAGNOSIS — R2681 Unsteadiness on feet: Secondary | ICD-10-CM | POA: Diagnosis not present

## 2023-07-10 DIAGNOSIS — R102 Pelvic and perineal pain: Secondary | ICD-10-CM | POA: Diagnosis not present

## 2023-07-15 DIAGNOSIS — R102 Pelvic and perineal pain: Secondary | ICD-10-CM | POA: Diagnosis not present

## 2023-07-15 DIAGNOSIS — R2681 Unsteadiness on feet: Secondary | ICD-10-CM | POA: Diagnosis not present

## 2023-07-15 DIAGNOSIS — M25559 Pain in unspecified hip: Secondary | ICD-10-CM | POA: Diagnosis not present

## 2023-07-17 DIAGNOSIS — R2681 Unsteadiness on feet: Secondary | ICD-10-CM | POA: Diagnosis not present

## 2023-07-17 DIAGNOSIS — R102 Pelvic and perineal pain: Secondary | ICD-10-CM | POA: Diagnosis not present

## 2023-07-17 DIAGNOSIS — M25559 Pain in unspecified hip: Secondary | ICD-10-CM | POA: Diagnosis not present

## 2023-07-22 DIAGNOSIS — M25559 Pain in unspecified hip: Secondary | ICD-10-CM | POA: Diagnosis not present

## 2023-07-22 DIAGNOSIS — R102 Pelvic and perineal pain: Secondary | ICD-10-CM | POA: Diagnosis not present

## 2023-07-22 DIAGNOSIS — R2681 Unsteadiness on feet: Secondary | ICD-10-CM | POA: Diagnosis not present

## 2023-07-23 DIAGNOSIS — G4733 Obstructive sleep apnea (adult) (pediatric): Secondary | ICD-10-CM | POA: Diagnosis not present

## 2023-07-23 DIAGNOSIS — N183 Chronic kidney disease, stage 3 unspecified: Secondary | ICD-10-CM | POA: Diagnosis not present

## 2023-07-23 DIAGNOSIS — S3289XD Fracture of other parts of pelvis, subsequent encounter for fracture with routine healing: Secondary | ICD-10-CM | POA: Diagnosis not present

## 2023-07-23 DIAGNOSIS — S3210XD Unspecified fracture of sacrum, subsequent encounter for fracture with routine healing: Secondary | ICD-10-CM | POA: Diagnosis not present

## 2023-07-23 DIAGNOSIS — I2510 Atherosclerotic heart disease of native coronary artery without angina pectoris: Secondary | ICD-10-CM | POA: Diagnosis not present

## 2023-07-23 DIAGNOSIS — S32591D Other specified fracture of right pubis, subsequent encounter for fracture with routine healing: Secondary | ICD-10-CM | POA: Diagnosis not present

## 2023-07-24 DIAGNOSIS — R2681 Unsteadiness on feet: Secondary | ICD-10-CM | POA: Diagnosis not present

## 2023-07-24 DIAGNOSIS — R102 Pelvic and perineal pain: Secondary | ICD-10-CM | POA: Diagnosis not present

## 2023-07-24 DIAGNOSIS — M25559 Pain in unspecified hip: Secondary | ICD-10-CM | POA: Diagnosis not present

## 2023-07-31 DIAGNOSIS — R102 Pelvic and perineal pain: Secondary | ICD-10-CM | POA: Diagnosis not present

## 2023-07-31 DIAGNOSIS — R2681 Unsteadiness on feet: Secondary | ICD-10-CM | POA: Diagnosis not present

## 2023-07-31 DIAGNOSIS — M25559 Pain in unspecified hip: Secondary | ICD-10-CM | POA: Diagnosis not present

## 2023-08-05 DIAGNOSIS — R2681 Unsteadiness on feet: Secondary | ICD-10-CM | POA: Diagnosis not present

## 2023-08-05 DIAGNOSIS — M25559 Pain in unspecified hip: Secondary | ICD-10-CM | POA: Diagnosis not present

## 2023-08-05 DIAGNOSIS — R102 Pelvic and perineal pain: Secondary | ICD-10-CM | POA: Diagnosis not present

## 2023-08-07 DIAGNOSIS — R2681 Unsteadiness on feet: Secondary | ICD-10-CM | POA: Diagnosis not present

## 2023-08-07 DIAGNOSIS — R102 Pelvic and perineal pain: Secondary | ICD-10-CM | POA: Diagnosis not present

## 2023-08-07 DIAGNOSIS — M25559 Pain in unspecified hip: Secondary | ICD-10-CM | POA: Diagnosis not present

## 2023-08-12 DIAGNOSIS — M25559 Pain in unspecified hip: Secondary | ICD-10-CM | POA: Diagnosis not present

## 2023-08-12 DIAGNOSIS — R2681 Unsteadiness on feet: Secondary | ICD-10-CM | POA: Diagnosis not present

## 2023-08-12 DIAGNOSIS — R102 Pelvic and perineal pain: Secondary | ICD-10-CM | POA: Diagnosis not present

## 2023-08-14 DIAGNOSIS — R102 Pelvic and perineal pain: Secondary | ICD-10-CM | POA: Diagnosis not present

## 2023-08-14 DIAGNOSIS — M25559 Pain in unspecified hip: Secondary | ICD-10-CM | POA: Diagnosis not present

## 2023-08-14 DIAGNOSIS — R2681 Unsteadiness on feet: Secondary | ICD-10-CM | POA: Diagnosis not present
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
## 2023-08-19 DIAGNOSIS — M25559 Pain in unspecified hip: Secondary | ICD-10-CM | POA: Diagnosis not present

## 2023-08-19 DIAGNOSIS — R2681 Unsteadiness on feet: Secondary | ICD-10-CM | POA: Diagnosis not present

## 2023-08-19 DIAGNOSIS — R102 Pelvic and perineal pain: Secondary | ICD-10-CM | POA: Diagnosis not present

## 2023-08-21 DIAGNOSIS — R102 Pelvic and perineal pain: Secondary | ICD-10-CM | POA: Diagnosis not present

## 2023-08-21 DIAGNOSIS — M25559 Pain in unspecified hip: Secondary | ICD-10-CM | POA: Diagnosis not present

## 2023-08-21 DIAGNOSIS — R2681 Unsteadiness on feet: Secondary | ICD-10-CM | POA: Diagnosis not present

## 2023-08-22 ENCOUNTER — Encounter: Payer: Self-pay | Admitting: Cardiology

## 2023-08-22 ENCOUNTER — Ambulatory Visit: Payer: PPO | Attending: Cardiology | Admitting: Cardiology

## 2023-08-22 VITALS — BP 156/84 | HR 70 | Ht 70.0 in | Wt 181.0 lb

## 2023-08-22 DIAGNOSIS — I680 Cerebral amyloid angiopathy: Secondary | ICD-10-CM | POA: Diagnosis not present

## 2023-08-22 DIAGNOSIS — I10 Essential (primary) hypertension: Secondary | ICD-10-CM | POA: Diagnosis not present

## 2023-08-22 DIAGNOSIS — I2510 Atherosclerotic heart disease of native coronary artery without angina pectoris: Secondary | ICD-10-CM | POA: Diagnosis not present

## 2023-08-22 DIAGNOSIS — E854 Organ-limited amyloidosis: Secondary | ICD-10-CM

## 2023-08-22 DIAGNOSIS — E7800 Pure hypercholesterolemia, unspecified: Secondary | ICD-10-CM

## 2023-08-22 DIAGNOSIS — Z9889 Other specified postprocedural states: Secondary | ICD-10-CM | POA: Diagnosis not present

## 2023-08-22 NOTE — Patient Instructions (Signed)
 Medication Instructions:  Continue same medications *If you need a refill on your cardiac medications before your next appointment, please call your pharmacy*  Lab Work: None ordered  Testing/Procedures: None ordered  Follow-Up: At [facility], you and your health needs are our priority.  As part of our continuing mission to provide you with exceptional heart care, our providers are all part of one team.  This team includes your primary Cardiologist (physician) and Advanced Practice Providers or APPs (Physician Assistants and Nurse Practitioners) who all work together to provide you with the care you need, when you need it.  Your next appointment:  1 year    Call in Dec to schedule April appointment     Provider:  Dr.Jordan   We recommend signing up for the patient portal called "MyChart".  Sign up information is provided on this After Visit Summary.  MyChart is used to connect with patients for Virtual Visits (Telemedicine).  Patients are able to view lab/test results, encounter notes, upcoming appointments, etc.  Non-urgent messages can be sent to your provider as well.   To learn more about what you can do with MyChart, go to [email or web address].       1st Floor: - Lobby - Registration  - Pharmacy  - Lab - Cafe  2nd Floor: - PV Lab - Diagnostic Testing (echo, CT, nuclear med)  3rd Floor: - Vacant  4th Floor: - TCTS (cardiothoracic surgery) - AFib Clinic - Structural Heart Clinic - Vascular Surgery  - Vascular Ultrasound  5th Floor: - HeartCare Cardiology (general and EP) - Clinical Pharmacy for coumadin, hypertension, lipid, weight-loss medications, and med management appointments    Valet parking services will be available as well.

## 2023-08-23 DIAGNOSIS — N183 Chronic kidney disease, stage 3 unspecified: Secondary | ICD-10-CM | POA: Diagnosis not present

## 2023-08-23 DIAGNOSIS — S3210XD Unspecified fracture of sacrum, subsequent encounter for fracture with routine healing: Secondary | ICD-10-CM | POA: Diagnosis not present

## 2023-08-23 DIAGNOSIS — G4733 Obstructive sleep apnea (adult) (pediatric): Secondary | ICD-10-CM | POA: Diagnosis not present

## 2023-08-23 DIAGNOSIS — S32591D Other specified fracture of right pubis, subsequent encounter for fracture with routine healing: Secondary | ICD-10-CM | POA: Diagnosis not present

## 2023-08-23 DIAGNOSIS — S3289XD Fracture of other parts of pelvis, subsequent encounter for fracture with routine healing: Secondary | ICD-10-CM | POA: Diagnosis not present

## 2023-08-23 DIAGNOSIS — I2510 Atherosclerotic heart disease of native coronary artery without angina pectoris: Secondary | ICD-10-CM | POA: Diagnosis not present

## 2023-08-26 DIAGNOSIS — R2681 Unsteadiness on feet: Secondary | ICD-10-CM | POA: Diagnosis not present

## 2023-08-26 DIAGNOSIS — R102 Pelvic and perineal pain: Secondary | ICD-10-CM | POA: Diagnosis not present

## 2023-08-26 DIAGNOSIS — M25559 Pain in unspecified hip: Secondary | ICD-10-CM | POA: Diagnosis not present

## 2023-08-28 DIAGNOSIS — R2681 Unsteadiness on feet: Secondary | ICD-10-CM | POA: Diagnosis not present

## 2023-08-28 DIAGNOSIS — R102 Pelvic and perineal pain: Secondary | ICD-10-CM | POA: Diagnosis not present

## 2023-08-28 DIAGNOSIS — M25559 Pain in unspecified hip: Secondary | ICD-10-CM | POA: Diagnosis not present

## 2023-09-02 DIAGNOSIS — R2681 Unsteadiness on feet: Secondary | ICD-10-CM | POA: Diagnosis not present

## 2023-09-02 DIAGNOSIS — M25559 Pain in unspecified hip: Secondary | ICD-10-CM | POA: Diagnosis not present

## 2023-09-02 DIAGNOSIS — R102 Pelvic and perineal pain: Secondary | ICD-10-CM | POA: Diagnosis not present

## 2023-09-09 DIAGNOSIS — R2681 Unsteadiness on feet: Secondary | ICD-10-CM | POA: Diagnosis not present

## 2023-09-09 DIAGNOSIS — R102 Pelvic and perineal pain: Secondary | ICD-10-CM | POA: Diagnosis not present

## 2023-09-09 DIAGNOSIS — M25559 Pain in unspecified hip: Secondary | ICD-10-CM | POA: Diagnosis not present

## 2023-09-11 DIAGNOSIS — R2681 Unsteadiness on feet: Secondary | ICD-10-CM | POA: Diagnosis not present

## 2023-09-11 DIAGNOSIS — M25559 Pain in unspecified hip: Secondary | ICD-10-CM | POA: Diagnosis not present

## 2023-09-11 DIAGNOSIS — R102 Pelvic and perineal pain: Secondary | ICD-10-CM | POA: Diagnosis not present

## 2023-09-16 DIAGNOSIS — M25559 Pain in unspecified hip: Secondary | ICD-10-CM | POA: Diagnosis not present

## 2023-09-16 DIAGNOSIS — R2681 Unsteadiness on feet: Secondary | ICD-10-CM | POA: Diagnosis not present

## 2023-09-16 DIAGNOSIS — R102 Pelvic and perineal pain: Secondary | ICD-10-CM | POA: Diagnosis not present

## 2023-09-18 DIAGNOSIS — M25559 Pain in unspecified hip: Secondary | ICD-10-CM | POA: Diagnosis not present

## 2023-09-18 DIAGNOSIS — R2681 Unsteadiness on feet: Secondary | ICD-10-CM | POA: Diagnosis not present

## 2023-09-18 DIAGNOSIS — R102 Pelvic and perineal pain: Secondary | ICD-10-CM | POA: Diagnosis not present

## 2023-09-22 DIAGNOSIS — S3289XD Fracture of other parts of pelvis, subsequent encounter for fracture with routine healing: Secondary | ICD-10-CM | POA: Diagnosis not present

## 2023-09-22 DIAGNOSIS — S3210XD Unspecified fracture of sacrum, subsequent encounter for fracture with routine healing: Secondary | ICD-10-CM | POA: Diagnosis not present

## 2023-09-22 DIAGNOSIS — N183 Chronic kidney disease, stage 3 unspecified: Secondary | ICD-10-CM | POA: Diagnosis not present

## 2023-09-22 DIAGNOSIS — G4733 Obstructive sleep apnea (adult) (pediatric): Secondary | ICD-10-CM | POA: Diagnosis not present

## 2023-09-22 DIAGNOSIS — S32591D Other specified fracture of right pubis, subsequent encounter for fracture with routine healing: Secondary | ICD-10-CM | POA: Diagnosis not present

## 2023-09-22 DIAGNOSIS — I2510 Atherosclerotic heart disease of native coronary artery without angina pectoris: Secondary | ICD-10-CM | POA: Diagnosis not present

## 2023-09-23 DIAGNOSIS — R102 Pelvic and perineal pain: Secondary | ICD-10-CM | POA: Diagnosis not present

## 2023-09-23 DIAGNOSIS — R2681 Unsteadiness on feet: Secondary | ICD-10-CM | POA: Diagnosis not present

## 2023-09-23 DIAGNOSIS — M25559 Pain in unspecified hip: Secondary | ICD-10-CM | POA: Diagnosis not present

## 2023-09-25 DIAGNOSIS — M25559 Pain in unspecified hip: Secondary | ICD-10-CM | POA: Diagnosis not present

## 2023-09-25 DIAGNOSIS — R102 Pelvic and perineal pain: Secondary | ICD-10-CM | POA: Diagnosis not present

## 2023-09-25 DIAGNOSIS — R2681 Unsteadiness on feet: Secondary | ICD-10-CM | POA: Diagnosis not present

## 2023-09-30 DIAGNOSIS — M25559 Pain in unspecified hip: Secondary | ICD-10-CM | POA: Diagnosis not present

## 2023-09-30 DIAGNOSIS — R2681 Unsteadiness on feet: Secondary | ICD-10-CM | POA: Diagnosis not present

## 2023-09-30 DIAGNOSIS — R102 Pelvic and perineal pain: Secondary | ICD-10-CM | POA: Diagnosis not present

## 2023-10-02 DIAGNOSIS — M25559 Pain in unspecified hip: Secondary | ICD-10-CM | POA: Diagnosis not present

## 2023-10-02 DIAGNOSIS — R2681 Unsteadiness on feet: Secondary | ICD-10-CM | POA: Diagnosis not present

## 2023-10-02 DIAGNOSIS — R102 Pelvic and perineal pain: Secondary | ICD-10-CM | POA: Diagnosis not present

## 2023-10-15 DIAGNOSIS — Z1331 Encounter for screening for depression: Secondary | ICD-10-CM | POA: Diagnosis not present

## 2023-10-15 DIAGNOSIS — R269 Unspecified abnormalities of gait and mobility: Secondary | ICD-10-CM | POA: Diagnosis not present

## 2023-10-15 DIAGNOSIS — R2689 Other abnormalities of gait and mobility: Secondary | ICD-10-CM | POA: Diagnosis not present

## 2023-10-15 DIAGNOSIS — G3184 Mild cognitive impairment, so stated: Secondary | ICD-10-CM | POA: Diagnosis not present

## 2023-10-16 DIAGNOSIS — M25559 Pain in unspecified hip: Secondary | ICD-10-CM | POA: Diagnosis not present

## 2023-10-16 DIAGNOSIS — R2681 Unsteadiness on feet: Secondary | ICD-10-CM | POA: Diagnosis not present

## 2023-10-16 DIAGNOSIS — R102 Pelvic and perineal pain: Secondary | ICD-10-CM | POA: Diagnosis not present

## 2023-10-21 DIAGNOSIS — R102 Pelvic and perineal pain: Secondary | ICD-10-CM | POA: Diagnosis not present

## 2023-10-21 DIAGNOSIS — M25559 Pain in unspecified hip: Secondary | ICD-10-CM | POA: Diagnosis not present

## 2023-10-21 DIAGNOSIS — R2681 Unsteadiness on feet: Secondary | ICD-10-CM | POA: Diagnosis not present

## 2023-10-23 DIAGNOSIS — M5431 Sciatica, right side: Secondary | ICD-10-CM | POA: Diagnosis not present

## 2023-10-23 DIAGNOSIS — E559 Vitamin D deficiency, unspecified: Secondary | ICD-10-CM | POA: Diagnosis not present

## 2023-10-23 DIAGNOSIS — N179 Acute kidney failure, unspecified: Secondary | ICD-10-CM | POA: Diagnosis not present

## 2023-10-23 DIAGNOSIS — Z7982 Long term (current) use of aspirin: Secondary | ICD-10-CM | POA: Diagnosis not present

## 2023-10-23 DIAGNOSIS — I2510 Atherosclerotic heart disease of native coronary artery without angina pectoris: Secondary | ICD-10-CM | POA: Diagnosis not present

## 2023-10-23 DIAGNOSIS — F0390 Unspecified dementia without behavioral disturbance: Secondary | ICD-10-CM | POA: Diagnosis not present

## 2023-10-23 DIAGNOSIS — S32591D Other specified fracture of right pubis, subsequent encounter for fracture with routine healing: Secondary | ICD-10-CM | POA: Diagnosis not present

## 2023-10-23 DIAGNOSIS — I680 Cerebral amyloid angiopathy: Secondary | ICD-10-CM | POA: Diagnosis not present

## 2023-10-23 DIAGNOSIS — I129 Hypertensive chronic kidney disease with stage 1 through stage 4 chronic kidney disease, or unspecified chronic kidney disease: Secondary | ICD-10-CM | POA: Diagnosis not present

## 2023-10-23 DIAGNOSIS — S3289XD Fracture of other parts of pelvis, subsequent encounter for fracture with routine healing: Secondary | ICD-10-CM | POA: Diagnosis not present

## 2023-10-23 DIAGNOSIS — N183 Chronic kidney disease, stage 3 unspecified: Secondary | ICD-10-CM | POA: Diagnosis not present

## 2023-10-23 DIAGNOSIS — E785 Hyperlipidemia, unspecified: Secondary | ICD-10-CM | POA: Diagnosis not present

## 2023-10-23 DIAGNOSIS — S3210XD Unspecified fracture of sacrum, subsequent encounter for fracture with routine healing: Secondary | ICD-10-CM | POA: Diagnosis not present

## 2023-10-23 DIAGNOSIS — E854 Organ-limited amyloidosis: Secondary | ICD-10-CM | POA: Diagnosis not present

## 2023-10-23 DIAGNOSIS — Z9181 History of falling: Secondary | ICD-10-CM | POA: Diagnosis not present

## 2023-10-23 DIAGNOSIS — G4733 Obstructive sleep apnea (adult) (pediatric): Secondary | ICD-10-CM | POA: Diagnosis not present

## 2023-10-27 DIAGNOSIS — Z85820 Personal history of malignant melanoma of skin: Secondary | ICD-10-CM | POA: Diagnosis not present

## 2023-10-27 DIAGNOSIS — Z08 Encounter for follow-up examination after completed treatment for malignant neoplasm: Secondary | ICD-10-CM | POA: Diagnosis not present

## 2023-10-27 DIAGNOSIS — D225 Melanocytic nevi of trunk: Secondary | ICD-10-CM | POA: Diagnosis not present

## 2023-10-27 DIAGNOSIS — Z1283 Encounter for screening for malignant neoplasm of skin: Secondary | ICD-10-CM | POA: Diagnosis not present

## 2023-10-27 DIAGNOSIS — X32XXXD Exposure to sunlight, subsequent encounter: Secondary | ICD-10-CM | POA: Diagnosis not present

## 2023-10-27 DIAGNOSIS — L570 Actinic keratosis: Secondary | ICD-10-CM | POA: Diagnosis not present

## 2023-10-31 DIAGNOSIS — F0390 Unspecified dementia without behavioral disturbance: Secondary | ICD-10-CM | POA: Diagnosis not present

## 2023-10-31 DIAGNOSIS — I129 Hypertensive chronic kidney disease with stage 1 through stage 4 chronic kidney disease, or unspecified chronic kidney disease: Secondary | ICD-10-CM | POA: Diagnosis not present

## 2023-10-31 DIAGNOSIS — E854 Organ-limited amyloidosis: Secondary | ICD-10-CM | POA: Diagnosis not present

## 2023-10-31 DIAGNOSIS — N179 Acute kidney failure, unspecified: Secondary | ICD-10-CM | POA: Diagnosis not present

## 2023-10-31 DIAGNOSIS — I680 Cerebral amyloid angiopathy: Secondary | ICD-10-CM | POA: Diagnosis not present

## 2023-10-31 DIAGNOSIS — I2510 Atherosclerotic heart disease of native coronary artery without angina pectoris: Secondary | ICD-10-CM | POA: Diagnosis not present

## 2023-10-31 DIAGNOSIS — N183 Chronic kidney disease, stage 3 unspecified: Secondary | ICD-10-CM | POA: Diagnosis not present

## 2023-11-06 DIAGNOSIS — R2681 Unsteadiness on feet: Secondary | ICD-10-CM | POA: Diagnosis not present

## 2023-11-06 DIAGNOSIS — R102 Pelvic and perineal pain: Secondary | ICD-10-CM | POA: Diagnosis not present

## 2023-11-06 DIAGNOSIS — M25559 Pain in unspecified hip: Secondary | ICD-10-CM | POA: Diagnosis not present

## 2023-11-11 DIAGNOSIS — R2681 Unsteadiness on feet: Secondary | ICD-10-CM | POA: Diagnosis not present

## 2023-11-11 DIAGNOSIS — M25559 Pain in unspecified hip: Secondary | ICD-10-CM | POA: Diagnosis not present

## 2023-11-11 DIAGNOSIS — R102 Pelvic and perineal pain: Secondary | ICD-10-CM | POA: Diagnosis not present

## 2023-11-13 DIAGNOSIS — E854 Organ-limited amyloidosis: Secondary | ICD-10-CM | POA: Diagnosis not present

## 2023-11-13 DIAGNOSIS — I129 Hypertensive chronic kidney disease with stage 1 through stage 4 chronic kidney disease, or unspecified chronic kidney disease: Secondary | ICD-10-CM | POA: Diagnosis not present

## 2023-11-13 DIAGNOSIS — N179 Acute kidney failure, unspecified: Secondary | ICD-10-CM | POA: Diagnosis not present

## 2023-11-13 DIAGNOSIS — I2510 Atherosclerotic heart disease of native coronary artery without angina pectoris: Secondary | ICD-10-CM | POA: Diagnosis not present

## 2023-11-13 DIAGNOSIS — I680 Cerebral amyloid angiopathy: Secondary | ICD-10-CM | POA: Diagnosis not present

## 2023-11-13 DIAGNOSIS — N183 Chronic kidney disease, stage 3 unspecified: Secondary | ICD-10-CM | POA: Diagnosis not present

## 2023-11-13 DIAGNOSIS — F0390 Unspecified dementia without behavioral disturbance: Secondary | ICD-10-CM | POA: Diagnosis not present

## 2023-11-17 ENCOUNTER — Ambulatory Visit: Payer: PPO | Admitting: Family Medicine

## 2023-11-18 DIAGNOSIS — R2681 Unsteadiness on feet: Secondary | ICD-10-CM | POA: Diagnosis not present

## 2023-11-18 DIAGNOSIS — E854 Organ-limited amyloidosis: Secondary | ICD-10-CM | POA: Diagnosis not present

## 2023-11-18 DIAGNOSIS — F0390 Unspecified dementia without behavioral disturbance: Secondary | ICD-10-CM | POA: Diagnosis not present

## 2023-11-18 DIAGNOSIS — R102 Pelvic and perineal pain: Secondary | ICD-10-CM | POA: Diagnosis not present

## 2023-11-18 DIAGNOSIS — M25559 Pain in unspecified hip: Secondary | ICD-10-CM | POA: Diagnosis not present

## 2023-11-18 DIAGNOSIS — I2510 Atherosclerotic heart disease of native coronary artery without angina pectoris: Secondary | ICD-10-CM | POA: Diagnosis not present

## 2023-11-18 DIAGNOSIS — N183 Chronic kidney disease, stage 3 unspecified: Secondary | ICD-10-CM | POA: Diagnosis not present

## 2023-11-18 DIAGNOSIS — I680 Cerebral amyloid angiopathy: Secondary | ICD-10-CM | POA: Diagnosis not present

## 2023-11-18 DIAGNOSIS — I129 Hypertensive chronic kidney disease with stage 1 through stage 4 chronic kidney disease, or unspecified chronic kidney disease: Secondary | ICD-10-CM | POA: Diagnosis not present

## 2023-11-18 DIAGNOSIS — N179 Acute kidney failure, unspecified: Secondary | ICD-10-CM | POA: Diagnosis not present

## 2023-11-20 DIAGNOSIS — R102 Pelvic and perineal pain: Secondary | ICD-10-CM | POA: Diagnosis not present

## 2023-11-20 DIAGNOSIS — R2681 Unsteadiness on feet: Secondary | ICD-10-CM | POA: Diagnosis not present

## 2023-11-20 DIAGNOSIS — M25559 Pain in unspecified hip: Secondary | ICD-10-CM | POA: Diagnosis not present

## 2023-11-21 ENCOUNTER — Ambulatory Visit (INDEPENDENT_AMBULATORY_CARE_PROVIDER_SITE_OTHER): Admitting: Family Medicine

## 2023-11-21 ENCOUNTER — Encounter: Payer: Self-pay | Admitting: Family Medicine

## 2023-11-21 VITALS — BP 132/90 | HR 56 | Temp 98.1°F | Ht 70.0 in | Wt 182.4 lb

## 2023-11-21 DIAGNOSIS — E854 Organ-limited amyloidosis: Secondary | ICD-10-CM | POA: Diagnosis not present

## 2023-11-21 DIAGNOSIS — I10 Essential (primary) hypertension: Secondary | ICD-10-CM | POA: Diagnosis not present

## 2023-11-21 DIAGNOSIS — I680 Cerebral amyloid angiopathy: Secondary | ICD-10-CM

## 2023-11-21 DIAGNOSIS — R7989 Other specified abnormal findings of blood chemistry: Secondary | ICD-10-CM | POA: Insufficient documentation

## 2023-11-21 DIAGNOSIS — R2689 Other abnormalities of gait and mobility: Secondary | ICD-10-CM | POA: Diagnosis not present

## 2023-11-21 DIAGNOSIS — N183 Chronic kidney disease, stage 3 unspecified: Secondary | ICD-10-CM | POA: Diagnosis not present

## 2023-11-21 DIAGNOSIS — E278 Other specified disorders of adrenal gland: Secondary | ICD-10-CM | POA: Diagnosis not present

## 2023-11-21 LAB — MICROALBUMIN / CREATININE URINE RATIO
Creatinine,U: 75 mg/dL
Microalb Creat Ratio: 11.9 mg/g (ref 0.0–30.0)
Microalb, Ur: 0.9 mg/dL (ref 0.0–1.9)

## 2023-11-21 LAB — RENAL FUNCTION PANEL
Albumin: 4.1 g/dL (ref 3.5–5.2)
BUN: 14 mg/dL (ref 6–23)
CO2: 30 meq/L (ref 19–32)
Calcium: 8.9 mg/dL (ref 8.4–10.5)
Chloride: 103 meq/L (ref 96–112)
Creatinine, Ser: 1.51 mg/dL — ABNORMAL HIGH (ref 0.40–1.50)
GFR: 44.06 mL/min — ABNORMAL LOW (ref >60.00)
Glucose, Bld: 116 mg/dL — ABNORMAL HIGH (ref 70–99)
Phosphorus: 3.3 mg/dL (ref 2.3–4.6)
Potassium: 4.2 meq/L (ref 3.5–5.1)
Sodium: 139 meq/L (ref 135–145)

## 2023-11-21 LAB — VITAMIN B12: Vitamin B-12: >1500 pg/mL — ABNORMAL HIGH (ref 211–911)

## 2023-11-21 NOTE — Assessment & Plan Note (Addendum)
 Discussed with pt and wife - will await kidney function then likely proceed with dedicated adrenal imaging to further evaluate nodular L adrenal gland.
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
## 2023-11-21 NOTE — Patient Instructions (Addendum)
 Labs today  Continue current medicines Good to see you today Return in 6 months or physical/wellness visit

## 2023-11-21 NOTE — Assessment & Plan Note (Signed)
 Update renal labs.  Only on carvedilol . H/o ARF to hyzaar 2017.

## 2023-11-21 NOTE — Assessment & Plan Note (Signed)
Continues outpatient PT.  

## 2023-11-21 NOTE — Assessment & Plan Note (Signed)
 He's been taking daily B12 replacement - rec drop to twice weekly. Update b12 levels today.

## 2023-11-21 NOTE — Assessment & Plan Note (Signed)
Appreciate neurology care.  

## 2023-11-21 NOTE — Assessment & Plan Note (Addendum)
 Chronic, adequate. Continue carvedilol .  Consider goal BP <130/80 in CKD hx

## 2023-11-22 LAB — PARATHYROID HORMONE, INTACT (NO CA): PTH: 35 pg/mL (ref 16–77)

## 2023-11-25 DIAGNOSIS — M25559 Pain in unspecified hip: Secondary | ICD-10-CM | POA: Diagnosis not present

## 2023-11-25 DIAGNOSIS — R102 Pelvic and perineal pain: Secondary | ICD-10-CM | POA: Diagnosis not present

## 2023-11-25 DIAGNOSIS — R2681 Unsteadiness on feet: Secondary | ICD-10-CM | POA: Diagnosis not present

## 2023-11-25 LAB — PROTEIN ELECTROPHORESIS, SERUM, WITH REFLEX
Albumin ELP: 4 g/dL (ref 3.8–4.8)
Alpha 1: 0.3 g/dL (ref 0.2–0.3)
Alpha 2: 0.5 g/dL (ref 0.5–0.9)
Beta 2: 0.4 g/dL (ref 0.2–0.5)
Beta Globulin: 0.4 g/dL (ref 0.4–0.6)
Gamma Globulin: 1 g/dL (ref 0.8–1.7)
Total Protein: 6.6 g/dL (ref 6.1–8.1)

## 2023-11-26 ENCOUNTER — Encounter: Payer: Self-pay | Admitting: Family Medicine

## 2023-11-26 ENCOUNTER — Ambulatory Visit: Payer: Self-pay | Admitting: Family Medicine

## 2023-11-26 DIAGNOSIS — E278 Other specified disorders of adrenal gland: Secondary | ICD-10-CM

## 2023-11-27 DIAGNOSIS — R102 Pelvic and perineal pain: Secondary | ICD-10-CM | POA: Diagnosis not present

## 2023-11-27 DIAGNOSIS — M25559 Pain in unspecified hip: Secondary | ICD-10-CM | POA: Diagnosis not present

## 2023-11-27 DIAGNOSIS — R2681 Unsteadiness on feet: Secondary | ICD-10-CM | POA: Diagnosis not present

## 2023-12-02 DIAGNOSIS — R2681 Unsteadiness on feet: Secondary | ICD-10-CM | POA: Diagnosis not present

## 2023-12-02 DIAGNOSIS — M25559 Pain in unspecified hip: Secondary | ICD-10-CM | POA: Diagnosis not present

## 2023-12-02 DIAGNOSIS — R102 Pelvic and perineal pain: Secondary | ICD-10-CM | POA: Diagnosis not present

## 2023-12-04 DIAGNOSIS — R2681 Unsteadiness on feet: Secondary | ICD-10-CM | POA: Diagnosis not present

## 2023-12-04 DIAGNOSIS — R102 Pelvic and perineal pain: Secondary | ICD-10-CM | POA: Diagnosis not present

## 2023-12-04 DIAGNOSIS — M25559 Pain in unspecified hip: Secondary | ICD-10-CM | POA: Diagnosis not present

## 2023-12-10 ENCOUNTER — Other Ambulatory Visit: Payer: Self-pay | Admitting: Family Medicine

## 2023-12-10 NOTE — Telephone Encounter (Signed)
 ERx

## 2023-12-10 NOTE — Telephone Encounter (Signed)
 Received by Dr. [place] in the past. Are you refilling?

## 2023-12-11 DIAGNOSIS — R2681 Unsteadiness on feet: Secondary | ICD-10-CM | POA: Diagnosis not present

## 2023-12-11 DIAGNOSIS — M25559 Pain in unspecified hip: Secondary | ICD-10-CM | POA: Diagnosis not present

## 2023-12-11 DIAGNOSIS — R102 Pelvic and perineal pain: Secondary | ICD-10-CM | POA: Diagnosis not present

## 2023-12-16 DIAGNOSIS — R2681 Unsteadiness on feet: Secondary | ICD-10-CM | POA: Diagnosis not present

## 2023-12-16 DIAGNOSIS — R102 Pelvic and perineal pain: Secondary | ICD-10-CM | POA: Diagnosis not present

## 2023-12-16 DIAGNOSIS — M25559 Pain in unspecified hip: Secondary | ICD-10-CM | POA: Diagnosis not present

## 2023-12-18 DIAGNOSIS — R2681 Unsteadiness on feet: Secondary | ICD-10-CM | POA: Diagnosis not present

## 2023-12-18 DIAGNOSIS — M25559 Pain in unspecified hip: Secondary | ICD-10-CM | POA: Diagnosis not present

## 2023-12-18 DIAGNOSIS — R102 Pelvic and perineal pain: Secondary | ICD-10-CM | POA: Diagnosis not present

## 2023-12-21 ENCOUNTER — Encounter (HOSPITAL_COMMUNITY): Payer: Self-pay

## 2023-12-21 ENCOUNTER — Ambulatory Visit (HOSPITAL_COMMUNITY): Admission: RE | Admit: 2023-12-21 | Source: Ambulatory Visit

## 2023-12-22 ENCOUNTER — Ambulatory Visit (HOSPITAL_COMMUNITY)

## 2023-12-22 ENCOUNTER — Ambulatory Visit (HOSPITAL_COMMUNITY)
Admission: RE | Admit: 2023-12-22 | Discharge: 2023-12-22 | Disposition: A | Discharge status: Home / Self Care | Source: Ambulatory Visit | Attending: Family Medicine | Admitting: Family Medicine

## 2023-12-22 DIAGNOSIS — Q630 Accessory kidney: Secondary | ICD-10-CM | POA: Diagnosis not present

## 2023-12-22 DIAGNOSIS — E278 Other specified disorders of adrenal gland: Secondary | ICD-10-CM | POA: Diagnosis not present

## 2023-12-22 DIAGNOSIS — Q625 Duplication of ureter: Secondary | ICD-10-CM | POA: Diagnosis not present

## 2023-12-22 LAB — POCT I-STAT CREATININE: Creatinine, Ser: 1.8 mg/dL — ABNORMAL HIGH (ref 0.61–1.24)

## 2023-12-22 MED ORDER — IOHEXOL 300 MG/ML  SOLN
80.0000 mL | Freq: Once | INTRAMUSCULAR | Status: AC | PRN
  Administered 2023-12-22 (×2): 80 mL via INTRAVENOUS

## 2023-12-23 DIAGNOSIS — R102 Pelvic and perineal pain: Secondary | ICD-10-CM | POA: Diagnosis not present

## 2023-12-23 DIAGNOSIS — R2681 Unsteadiness on feet: Secondary | ICD-10-CM | POA: Diagnosis not present

## 2023-12-23 DIAGNOSIS — M25559 Pain in unspecified hip: Secondary | ICD-10-CM | POA: Diagnosis not present

## 2023-12-25 DIAGNOSIS — M25559 Pain in unspecified hip: Secondary | ICD-10-CM | POA: Diagnosis not present

## 2023-12-25 DIAGNOSIS — R2681 Unsteadiness on feet: Secondary | ICD-10-CM | POA: Diagnosis not present

## 2023-12-25 DIAGNOSIS — R102 Pelvic and perineal pain: Secondary | ICD-10-CM | POA: Diagnosis not present

## 2023-12-28 ENCOUNTER — Encounter: Payer: Self-pay | Admitting: Family Medicine

## 2023-12-30 DIAGNOSIS — R2681 Unsteadiness on feet: Secondary | ICD-10-CM | POA: Diagnosis not present

## 2023-12-30 DIAGNOSIS — M25559 Pain in unspecified hip: Secondary | ICD-10-CM | POA: Diagnosis not present

## 2023-12-30 DIAGNOSIS — R102 Pelvic and perineal pain: Secondary | ICD-10-CM | POA: Diagnosis not present

## 2024-01-05 ENCOUNTER — Ambulatory Visit: Payer: Self-pay | Admitting: Family Medicine

## 2024-01-05 DIAGNOSIS — E278 Other specified disorders of adrenal gland: Secondary | ICD-10-CM

## 2024-01-06 DIAGNOSIS — R2681 Unsteadiness on feet: Secondary | ICD-10-CM | POA: Diagnosis not present

## 2024-01-06 DIAGNOSIS — M25559 Pain in unspecified hip: Secondary | ICD-10-CM | POA: Diagnosis not present

## 2024-01-06 DIAGNOSIS — R102 Pelvic and perineal pain: Secondary | ICD-10-CM | POA: Diagnosis not present

## 2024-01-08 DIAGNOSIS — R102 Pelvic and perineal pain: Secondary | ICD-10-CM | POA: Diagnosis not present

## 2024-01-08 DIAGNOSIS — R2681 Unsteadiness on feet: Secondary | ICD-10-CM | POA: Diagnosis not present

## 2024-01-08 DIAGNOSIS — M25559 Pain in unspecified hip: Secondary | ICD-10-CM | POA: Diagnosis not present

## 2024-01-13 DIAGNOSIS — M25559 Pain in unspecified hip: Secondary | ICD-10-CM | POA: Diagnosis not present

## 2024-01-13 DIAGNOSIS — R102 Pelvic and perineal pain: Secondary | ICD-10-CM | POA: Diagnosis not present

## 2024-01-13 DIAGNOSIS — R2681 Unsteadiness on feet: Secondary | ICD-10-CM | POA: Diagnosis not present

## 2024-01-20 DIAGNOSIS — R102 Pelvic and perineal pain: Secondary | ICD-10-CM | POA: Diagnosis not present

## 2024-01-20 DIAGNOSIS — M25559 Pain in unspecified hip: Secondary | ICD-10-CM | POA: Diagnosis not present

## 2024-01-20 DIAGNOSIS — R2681 Unsteadiness on feet: Secondary | ICD-10-CM | POA: Diagnosis not present

## 2024-01-22 DIAGNOSIS — M25559 Pain in unspecified hip: Secondary | ICD-10-CM | POA: Diagnosis not present

## 2024-01-22 DIAGNOSIS — R2681 Unsteadiness on feet: Secondary | ICD-10-CM | POA: Diagnosis not present

## 2024-01-22 DIAGNOSIS — R102 Pelvic and perineal pain: Secondary | ICD-10-CM | POA: Diagnosis not present

## 2024-01-29 DIAGNOSIS — M25559 Pain in unspecified hip: Secondary | ICD-10-CM | POA: Diagnosis not present

## 2024-01-29 DIAGNOSIS — R2681 Unsteadiness on feet: Secondary | ICD-10-CM | POA: Diagnosis not present

## 2024-01-29 DIAGNOSIS — R102 Pelvic and perineal pain: Secondary | ICD-10-CM | POA: Diagnosis not present

## 2024-02-03 DIAGNOSIS — R102 Pelvic and perineal pain: Secondary | ICD-10-CM | POA: Diagnosis not present

## 2024-02-03 DIAGNOSIS — M25559 Pain in unspecified hip: Secondary | ICD-10-CM | POA: Diagnosis not present

## 2024-02-03 DIAGNOSIS — R2681 Unsteadiness on feet: Secondary | ICD-10-CM | POA: Diagnosis not present

## 2024-02-05 DIAGNOSIS — M25559 Pain in unspecified hip: Secondary | ICD-10-CM | POA: Diagnosis not present

## 2024-02-05 DIAGNOSIS — R102 Pelvic and perineal pain: Secondary | ICD-10-CM | POA: Diagnosis not present

## 2024-02-05 DIAGNOSIS — R2681 Unsteadiness on feet: Secondary | ICD-10-CM | POA: Diagnosis not present

## 2024-02-10 DIAGNOSIS — R2681 Unsteadiness on feet: Secondary | ICD-10-CM | POA: Diagnosis not present

## 2024-02-10 DIAGNOSIS — R102 Pelvic and perineal pain: Secondary | ICD-10-CM | POA: Diagnosis not present

## 2024-02-10 DIAGNOSIS — M25559 Pain in unspecified hip: Secondary | ICD-10-CM | POA: Diagnosis not present

## 2024-02-12 DIAGNOSIS — R102 Pelvic and perineal pain unspecified side: Secondary | ICD-10-CM | POA: Diagnosis not present

## 2024-02-12 DIAGNOSIS — M25559 Pain in unspecified hip: Secondary | ICD-10-CM | POA: Diagnosis not present

## 2024-02-16 DIAGNOSIS — R269 Unspecified abnormalities of gait and mobility: Secondary | ICD-10-CM | POA: Diagnosis not present

## 2024-02-16 DIAGNOSIS — G3184 Mild cognitive impairment, so stated: Secondary | ICD-10-CM | POA: Diagnosis not present

## 2024-02-17 DIAGNOSIS — R102 Pelvic and perineal pain unspecified side: Secondary | ICD-10-CM | POA: Diagnosis not present

## 2024-02-17 DIAGNOSIS — M25559 Pain in unspecified hip: Secondary | ICD-10-CM | POA: Diagnosis not present

## 2024-02-17 DIAGNOSIS — R2681 Unsteadiness on feet: Secondary | ICD-10-CM | POA: Diagnosis not present

## 2024-02-19 DIAGNOSIS — R2681 Unsteadiness on feet: Secondary | ICD-10-CM | POA: Diagnosis not present

## 2024-02-19 DIAGNOSIS — R102 Pelvic and perineal pain unspecified side: Secondary | ICD-10-CM | POA: Diagnosis not present

## 2024-02-19 DIAGNOSIS — M25559 Pain in unspecified hip: Secondary | ICD-10-CM | POA: Diagnosis not present

## 2024-02-24 DIAGNOSIS — R102 Pelvic and perineal pain unspecified side: Secondary | ICD-10-CM | POA: Diagnosis not present

## 2024-02-24 DIAGNOSIS — R2681 Unsteadiness on feet: Secondary | ICD-10-CM | POA: Diagnosis not present

## 2024-02-24 DIAGNOSIS — M25559 Pain in unspecified hip: Secondary | ICD-10-CM | POA: Diagnosis not present

## 2024-02-26 DIAGNOSIS — R2681 Unsteadiness on feet: Secondary | ICD-10-CM | POA: Diagnosis not present

## 2024-02-26 DIAGNOSIS — R102 Pelvic and perineal pain unspecified side: Secondary | ICD-10-CM | POA: Diagnosis not present

## 2024-02-26 DIAGNOSIS — M25559 Pain in unspecified hip: Secondary | ICD-10-CM | POA: Diagnosis not present

## 2024-03-04 DIAGNOSIS — M25559 Pain in unspecified hip: Secondary | ICD-10-CM | POA: Diagnosis not present

## 2024-03-04 DIAGNOSIS — R102 Pelvic and perineal pain unspecified side: Secondary | ICD-10-CM | POA: Diagnosis not present

## 2024-03-04 DIAGNOSIS — R2681 Unsteadiness on feet: Secondary | ICD-10-CM | POA: Diagnosis not present

## 2024-03-09 DIAGNOSIS — R2681 Unsteadiness on feet: Secondary | ICD-10-CM | POA: Diagnosis not present

## 2024-03-09 DIAGNOSIS — M25559 Pain in unspecified hip: Secondary | ICD-10-CM | POA: Diagnosis not present

## 2024-03-09 DIAGNOSIS — R102 Pelvic and perineal pain unspecified side: Secondary | ICD-10-CM | POA: Diagnosis not present

## 2024-03-16 DIAGNOSIS — M25559 Pain in unspecified hip: Secondary | ICD-10-CM | POA: Diagnosis not present

## 2024-03-16 DIAGNOSIS — R102 Pelvic and perineal pain unspecified side: Secondary | ICD-10-CM | POA: Diagnosis not present

## 2024-03-16 DIAGNOSIS — R2681 Unsteadiness on feet: Secondary | ICD-10-CM | POA: Diagnosis not present

## 2024-03-18 DIAGNOSIS — M25559 Pain in unspecified hip: Secondary | ICD-10-CM | POA: Diagnosis not present

## 2024-03-18 DIAGNOSIS — R102 Pelvic and perineal pain unspecified side: Secondary | ICD-10-CM | POA: Diagnosis not present

## 2024-03-18 DIAGNOSIS — R2681 Unsteadiness on feet: Secondary | ICD-10-CM | POA: Diagnosis not present

## 2024-03-23 DIAGNOSIS — R102 Pelvic and perineal pain unspecified side: Secondary | ICD-10-CM | POA: Diagnosis not present

## 2024-03-23 DIAGNOSIS — M25559 Pain in unspecified hip: Secondary | ICD-10-CM | POA: Diagnosis not present

## 2024-03-23 DIAGNOSIS — R2681 Unsteadiness on feet: Secondary | ICD-10-CM | POA: Diagnosis not present

## 2024-03-30 DIAGNOSIS — M25559 Pain in unspecified hip: Secondary | ICD-10-CM | POA: Diagnosis not present

## 2024-03-30 DIAGNOSIS — R102 Pelvic and perineal pain unspecified side: Secondary | ICD-10-CM | POA: Diagnosis not present

## 2024-03-30 DIAGNOSIS — R2681 Unsteadiness on feet: Secondary | ICD-10-CM | POA: Diagnosis not present

## 2024-03-31 ENCOUNTER — Telehealth: Payer: Self-pay | Admitting: Family Medicine

## 2024-03-31 NOTE — Telephone Encounter (Signed)
 Type of form received:  DMV  Additional comments: NA  Received by: Inocente Moats should be Faxed to:  NA  Form should be mailed to:  NA  Is patient requesting call for pickup: Yes  Form placed:  Provider folder  Attach charge sheet. Yes  Individual made aware of 3-5 business day turn around (Y/N)?  Yes

## 2024-04-01 DIAGNOSIS — R2681 Unsteadiness on feet: Secondary | ICD-10-CM | POA: Diagnosis not present

## 2024-04-01 DIAGNOSIS — M25559 Pain in unspecified hip: Secondary | ICD-10-CM | POA: Diagnosis not present

## 2024-04-01 DIAGNOSIS — R102 Pelvic and perineal pain unspecified side: Secondary | ICD-10-CM | POA: Diagnosis not present

## 2024-04-06 DIAGNOSIS — M25559 Pain in unspecified hip: Secondary | ICD-10-CM | POA: Diagnosis not present

## 2024-04-06 DIAGNOSIS — R2681 Unsteadiness on feet: Secondary | ICD-10-CM | POA: Diagnosis not present

## 2024-04-06 DIAGNOSIS — R102 Pelvic and perineal pain unspecified side: Secondary | ICD-10-CM | POA: Diagnosis not present

## 2024-04-13 DIAGNOSIS — Z0279 Encounter for issue of other medical certificate: Secondary | ICD-10-CM

## 2024-04-13 NOTE — Telephone Encounter (Signed)
 Patient will have a 29$ charge for forms that were completed

## 2024-04-13 NOTE — Telephone Encounter (Unsigned)
 Copied from CRM #[id number]. Topic: General - Other >> Apr 13, 2024 10:35 AM Dedra B wrote: Reason for CRM: Pt returning call for Joellen regarding forms. Relayed message that forms were ready to be picked up. Pt said she will probably pick them up tomorrow.

## 2024-04-13 NOTE — Telephone Encounter (Signed)
Form filled out and placed in my outbox

## 2024-04-13 NOTE — Telephone Encounter (Signed)
 Form placed at reception with charge sheet and copy sent to scan in chart.  No further action needed at this time.

## 2024-04-13 NOTE — Telephone Encounter (Signed)
 Left message to return call to our office.

## 2024-04-15 DIAGNOSIS — R2681 Unsteadiness on feet: Secondary | ICD-10-CM | POA: Diagnosis not present

## 2024-04-15 DIAGNOSIS — M25559 Pain in unspecified hip: Secondary | ICD-10-CM | POA: Diagnosis not present

## 2024-04-15 DIAGNOSIS — R102 Pelvic and perineal pain unspecified side: Secondary | ICD-10-CM | POA: Diagnosis not present

## 2024-04-22 DIAGNOSIS — R102 Pelvic and perineal pain unspecified side: Secondary | ICD-10-CM | POA: Diagnosis not present

## 2024-04-22 DIAGNOSIS — R2681 Unsteadiness on feet: Secondary | ICD-10-CM | POA: Diagnosis not present

## 2024-04-22 DIAGNOSIS — M25559 Pain in unspecified hip: Secondary | ICD-10-CM | POA: Diagnosis not present

## 2024-05-24 ENCOUNTER — Encounter: Payer: Self-pay | Admitting: Family Medicine

## 2024-05-24 ENCOUNTER — Ambulatory Visit: Admitting: Family Medicine

## 2024-05-24 VITALS — BP 138/82 | HR 67 | Temp 97.9°F | Ht 70.0 in | Wt 180.0 lb

## 2024-05-24 DIAGNOSIS — R7303 Prediabetes: Secondary | ICD-10-CM

## 2024-05-24 DIAGNOSIS — I2510 Atherosclerotic heart disease of native coronary artery without angina pectoris: Secondary | ICD-10-CM

## 2024-05-24 DIAGNOSIS — H905 Unspecified sensorineural hearing loss: Secondary | ICD-10-CM | POA: Diagnosis not present

## 2024-05-24 DIAGNOSIS — Z9889 Other specified postprocedural states: Secondary | ICD-10-CM

## 2024-05-24 DIAGNOSIS — Z85820 Personal history of malignant melanoma of skin: Secondary | ICD-10-CM

## 2024-05-24 DIAGNOSIS — N183 Chronic kidney disease, stage 3 unspecified: Secondary | ICD-10-CM

## 2024-05-24 DIAGNOSIS — Z0000 Encounter for general adult medical examination without abnormal findings: Secondary | ICD-10-CM

## 2024-05-24 DIAGNOSIS — E278 Other specified disorders of adrenal gland: Secondary | ICD-10-CM | POA: Diagnosis not present

## 2024-05-24 DIAGNOSIS — E785 Hyperlipidemia, unspecified: Secondary | ICD-10-CM | POA: Diagnosis not present

## 2024-05-24 DIAGNOSIS — Z23 Encounter for immunization: Secondary | ICD-10-CM | POA: Diagnosis not present

## 2024-05-24 DIAGNOSIS — I680 Cerebral amyloid angiopathy: Secondary | ICD-10-CM

## 2024-05-24 DIAGNOSIS — R7989 Other specified abnormal findings of blood chemistry: Secondary | ICD-10-CM | POA: Diagnosis not present

## 2024-05-24 DIAGNOSIS — I10 Essential (primary) hypertension: Secondary | ICD-10-CM | POA: Diagnosis not present

## 2024-05-24 DIAGNOSIS — E559 Vitamin D deficiency, unspecified: Secondary | ICD-10-CM | POA: Diagnosis not present

## 2024-05-24 DIAGNOSIS — Z7189 Other specified counseling: Secondary | ICD-10-CM

## 2024-05-24 LAB — COMPREHENSIVE METABOLIC PANEL WITH GFR
ALT: 19 U/L (ref 3–53)
AST: 20 U/L (ref 5–37)
Albumin: 4.1 g/dL (ref 3.5–5.2)
Alkaline Phosphatase: 83 U/L (ref 39–117)
BUN: 22 mg/dL (ref 6–23)
CO2: 29 meq/L (ref 19–32)
Calcium: 9.1 mg/dL (ref 8.4–10.5)
Chloride: 101 meq/L (ref 96–112)
Creatinine, Ser: 1.78 mg/dL — ABNORMAL HIGH (ref 0.40–1.50)
GFR: 36.04 mL/min — ABNORMAL LOW
Glucose, Bld: 93 mg/dL (ref 70–99)
Potassium: 4.2 meq/L (ref 3.5–5.1)
Sodium: 136 meq/L (ref 135–145)
Total Bilirubin: 0.9 mg/dL (ref 0.2–1.2)
Total Protein: 6.9 g/dL (ref 6.0–8.3)

## 2024-05-24 LAB — CBC WITH DIFFERENTIAL/PLATELET
Basophils Absolute: 0.1 K/uL (ref 0.0–0.1)
Basophils Relative: 0.8 % (ref 0.0–3.0)
Eosinophils Absolute: 0.1 K/uL (ref 0.0–0.7)
Eosinophils Relative: 0.8 % (ref 0.0–5.0)
HCT: 41.2 % (ref 39.0–52.0)
Hemoglobin: 14.4 g/dL (ref 13.0–17.0)
Lymphocytes Relative: 17.6 % (ref 12.0–46.0)
Lymphs Abs: 1.4 K/uL (ref 0.7–4.0)
MCHC: 35 g/dL (ref 30.0–36.0)
MCV: 96 fl (ref 78.0–100.0)
Monocytes Absolute: 0.5 K/uL (ref 0.1–1.0)
Monocytes Relative: 6.8 % (ref 3.0–12.0)
Neutro Abs: 5.8 K/uL (ref 1.4–7.7)
Neutrophils Relative %: 74 % (ref 43.0–77.0)
Platelets: 186 K/uL (ref 150.0–400.0)
RBC: 4.3 Mil/uL (ref 4.22–5.81)
RDW: 13.8 % (ref 11.5–15.5)
WBC: 7.9 K/uL (ref 4.0–10.5)

## 2024-05-24 LAB — LIPID PANEL
Cholesterol: 124 mg/dL (ref 28–200)
HDL: 54.3 mg/dL
LDL Cholesterol: 55 mg/dL (ref 10–99)
NonHDL: 69.57
Total CHOL/HDL Ratio: 2
Triglycerides: 72 mg/dL (ref 10.0–149.0)
VLDL: 14.4 mg/dL (ref 0.0–40.0)

## 2024-05-24 LAB — VITAMIN D 25 HYDROXY (VIT D DEFICIENCY, FRACTURES): VITD: 52.12 ng/mL (ref 30.00–100.00)

## 2024-05-24 LAB — HEMOGLOBIN A1C: Hgb A1c MFr Bld: 6.1 % (ref 4.6–6.5)

## 2024-05-24 LAB — VITAMIN B12: Vitamin B-12: 473 pg/mL (ref 211–911)

## 2024-05-24 LAB — MICROALBUMIN / CREATININE URINE RATIO
Creatinine,U: 86.5 mg/dL
Microalb Creat Ratio: 27.4 mg/g (ref 0.0–30.0)
Microalb, Ur: 2.4 mg/dL — ABNORMAL HIGH (ref 0.7–1.9)

## 2024-05-24 LAB — PHOSPHORUS: Phosphorus: 3.5 mg/dL (ref 2.3–4.6)

## 2024-05-24 MED ORDER — ATORVASTATIN CALCIUM 80 MG PO TABS: 80.0000 mg | ORAL_TABLET | Freq: Every day | ORAL | 3 refills | Status: AC

## 2024-05-24 MED ORDER — NITROGLYCERIN 0.4 MG SL SUBL: 0.4000 mg | SUBLINGUAL_TABLET | SUBLINGUAL | 1 refills | Status: AC | PRN

## 2024-05-24 MED ORDER — BUPROPION HCL ER (SR) 100 MG PO TB12: 100.0000 mg | ORAL_TABLET | Freq: Every morning | ORAL | 3 refills | Status: AC

## 2024-05-24 MED ORDER — CARVEDILOL 12.5 MG PO TABS: 12.5000 mg | ORAL_TABLET | Freq: Two times a day (BID) | ORAL | 3 refills | Status: AC

## 2024-05-24 NOTE — Assessment & Plan Note (Signed)
Continue regular dermatology f/u.

## 2024-05-24 NOTE — Assessment & Plan Note (Signed)
"  Followed by neurology   "

## 2024-05-24 NOTE — Assessment & Plan Note (Signed)
Update renal panel 

## 2024-05-24 NOTE — Assessment & Plan Note (Signed)
 Chronic, stable only on carvedilol 

## 2024-05-24 NOTE — Assessment & Plan Note (Signed)
 Preventative protocols reviewed and updated unless pt declined. Discussed healthy diet and lifestyle.

## 2024-05-24 NOTE — Assessment & Plan Note (Addendum)
 Mild left sided adrenal hyperplasia stable on latest imaging (12/2023) - without difficult to control BP HA or other symptoms, will not further evaluate with hormonal labs

## 2024-05-24 NOTE — Assessment & Plan Note (Addendum)
 See above

## 2024-05-24 NOTE — Assessment & Plan Note (Signed)
 Update A1c off treatment.

## 2024-05-24 NOTE — Assessment & Plan Note (Signed)
Update b12 levels off replacement

## 2024-05-24 NOTE — Assessment & Plan Note (Signed)
 Chronic, update FLP on atorva 80mg  daily  The ASCVD Risk score (Arnett DK, et al., 2019) failed to calculate for the following reasons:   Risk score cannot be calculated because patient has a medical history suggesting prior/existing ASCVD   * - Cholesterol units were assumed

## 2024-05-24 NOTE — Assessment & Plan Note (Signed)
"  Previously discussed    "

## 2024-05-24 NOTE — Assessment & Plan Note (Signed)
Not wearing hearing aides.

## 2024-05-24 NOTE — Assessment & Plan Note (Signed)
"  Sees cardiology.    "

## 2024-05-24 NOTE — Assessment & Plan Note (Signed)
Update levels on 1000 international units  daily.

## 2024-05-24 NOTE — Progress Notes (Signed)
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 aldosterone concentration and plasma renin activity (if hypertensive or hypokalemic)   Assessment & Plan: Mild left sided adrenal hyperplasia stable on latest imaging (12/2023) - without difficult to control BP HA or other symptoms, will not further evaluate with hormonal labs   Prediabetes Assessment & Plan: Update A1c off treatment.   Orders: -     Hemoglobin A1c  Vitamin D  deficiency Assessment & Plan: Update levels on 1000 international units  daily   Orders: -     VITAMIN D  25 Hydroxy (Vit-D Deficiency, Fractures)  Need for vaccination against Streptococcus pneumoniae -     Pneumococcal conjugate vaccine 20-valent  Encounter for immunization -     Flu vaccine HIGH DOSE PF(Fluzone  Trivalent)  Coronary artery disease involving native coronary artery of native heart without angina pectoris Assessment & Plan: Sees cardiology    Essential hypertension Assessment & Plan: Chronic,  stable only on carvedilol    Congenital hearing loss of both ears Assessment & Plan: Not wearing hearing aides    History of melanoma Overview: Dr Jesse [name]  Assessment & Plan: Continue regular dermatology f/u   S/P minimally-invasive mitral valve repair Overview: Complex valvuloplasty including artificial Gore-tex neochord placement x6 with 32 mm Sorin Memo 4D ring annuloplasty via right mini thoracotomy approach   Other orders -     Carvedilol ; Take 1 tablet (12.5 mg total) by mouth 2 (two) times daily with a meal.  Dispense: 180 tablet; Refill: 3 -     buPROPion  HCl ER (SR); Take 1 tablet (100 mg total) by mouth every morning.  Dispense: 90 tablet; Refill: 3 -     Atorvastatin  Calcium ; Take 1 tablet (80 mg total) by mouth daily.  Dispense: 90 tablet; Refill: 3 -     Nitroglycerin ; Place 1 tablet (0.4 mg total) under the tongue every 5 (five) minutes as needed for chest pain.  Dispense: 25 tablet; Refill: 1   Anton Blas, MD   05/25/2024   Return in about 1 year (around 05/24/2025) for annual exam, prior fasting for blood work, medicare wellness visit.  After Visit Summary: (In Person-Printed) AVS printed and given to the patient  "

## 2024-05-24 NOTE — Patient Instructions (Addendum)
 Flu shot and pneumonia shot today  Send us  date of 2nd shingles shot.  Return in 1 year for next physical/wellness visit   Jesse [name],  Thank you for taking the time for your Medicare Wellness Visit. I appreciate your continued commitment to your health goals. Please review the care plan we discussed, and feel free to reach out if I can assist you further.  Please note that Annual Wellness Visits do not include a physical exam. Some assessments may be limited, especially if the visit was conducted virtually. If needed, we may recommend an in-person follow-up with your provider.  Ongoing Care Seeing your primary care provider every 3 to 6 months helps us  monitor your health and provide consistent, personalized care.   Referrals If a referral was made during today's visit and you haven't received any updates within two weeks, please contact the referred provider directly to check on the status.  Recommended Screenings: Health Maintenance  Topic Date Due   Complete foot exam   Never done   Eye exam for diabetics  09/22/2009   COVID-19 Vaccine (3 - Moderna risk series) 09/04/2019   Zoster (Shingles) Vaccine (2 of 2) 12/28/2021   Medicare Annual Wellness Visit  08/15/2022   Hemoglobin A1C  11/16/2023   Flu Shot  12/12/2023   Yearly kidney function blood test for diabetes  11/20/2024   Yearly kidney health urinalysis for diabetes  11/20/2024   Colon Cancer Screening  02/14/2028   DTaP/Tdap/Td vaccine (4 - Td or Tdap) 08/15/2031   Pneumococcal Vaccine for age over 67  Completed   Hepatitis C Screening  Completed   Meningitis B Vaccine  Aged Out       05/24/2024   10:12 AM  Advanced Directives  Does Patient Have a Medical Advance Directive? Yes  Type of Advance Directive Healthcare Power of Attorney  Does patient want to make changes to medical advance directive? Yes (ED - Information included in AVS)  Copy of Healthcare Power of Attorney in Chart? Yes - validated most recent copy  scanned in chart (See row information)   Vision: Annual vision screenings are recommended for early detection of glaucoma, cataracts, and diabetic retinopathy. These exams can also reveal signs of chronic conditions such as diabetes and high blood pressure.  Dental: Annual dental screenings help detect early signs of oral cancer, gum disease, and other conditions linked to overall health, including heart disease and diabetes.  Please see the attached documents for additional preventive care recommendations.

## 2024-05-25 LAB — PARATHYROID HORMONE, INTACT (NO CA): PTH: 49 pg/mL (ref 16–77)

## 2024-05-29 ENCOUNTER — Ambulatory Visit: Payer: Self-pay | Admitting: Family Medicine

## 2024-11-22 ENCOUNTER — Ambulatory Visit: Admitting: Family Medicine
